# Patient Record
Sex: Female | Born: 1955 | Race: White | Hispanic: No | Marital: Married | Smoking: Former smoker
Health system: Southern US, Community
[De-identification: ages and names within clinical notes are randomized; demographics above are authoritative.]

## PROBLEM LIST (undated history)

## (undated) DIAGNOSIS — K5792 Diverticulitis of intestine, part unspecified, without perforation or abscess without bleeding: Secondary | ICD-10-CM

## (undated) DIAGNOSIS — M1990 Unspecified osteoarthritis, unspecified site: Secondary | ICD-10-CM

## (undated) DIAGNOSIS — N200 Calculus of kidney: Secondary | ICD-10-CM

## (undated) DIAGNOSIS — M722 Plantar fascial fibromatosis: Secondary | ICD-10-CM

## (undated) DIAGNOSIS — Z87442 Personal history of urinary calculi: Secondary | ICD-10-CM

## (undated) HISTORY — DX: Plantar fascial fibromatosis: M72.2

## (undated) HISTORY — DX: Unspecified osteoarthritis, unspecified site: M19.90

## (undated) HISTORY — PX: TONSILECTOMY, ADENOIDECTOMY, BILATERAL MYRINGOTOMY AND TUBES: SHX2538

## (undated) HISTORY — DX: Diverticulitis of intestine, part unspecified, without perforation or abscess without bleeding: K57.92

## (undated) HISTORY — DX: Calculus of kidney: N20.0

---

## 1980-10-14 HISTORY — PX: CHOLECYSTECTOMY: SHX55

## 1991-10-15 HISTORY — PX: TUBAL LIGATION: SHX77

## 2006-04-21 ENCOUNTER — Emergency Department (HOSPITAL_COMMUNITY): Admission: EM | Admit: 2006-04-21 | Discharge: 2006-04-21 | Payer: Self-pay | Admitting: Emergency Medicine

## 2009-01-20 ENCOUNTER — Ambulatory Visit: Payer: Self-pay | Admitting: Family Medicine

## 2009-11-14 ENCOUNTER — Ambulatory Visit: Payer: Self-pay | Admitting: Family Medicine

## 2009-11-24 ENCOUNTER — Ambulatory Visit: Payer: Self-pay | Admitting: Family Medicine

## 2009-12-18 ENCOUNTER — Ambulatory Visit: Payer: Self-pay | Admitting: Occupational Medicine

## 2009-12-18 DIAGNOSIS — D367 Benign neoplasm of other specified sites: Secondary | ICD-10-CM

## 2010-11-13 NOTE — Miscellaneous (Signed)
Summary: Immunizations  Immunizations   Imported By: Junius Finner 11/14/2009 13:34:48  _____________________________________________________________________  External Attachment:    Type:   Image     Comment:   External Document

## 2010-11-13 NOTE — Assessment & Plan Note (Signed)
Summary: WART/KH   Vital Signs:  Patient Profile:   55 Years Old Female CC:      Knot on rt middle finger x 6 months Height:     61.5 inches Weight:      210 pounds O2 Sat:      97 % O2 treatment:    Room Air Temp:     96.5 degrees F oral Pulse rate:   80 / minute Pulse rhythm:   regular Resp:     16 per minute BP sitting:   125 / 66  (right arm) Cuff size:   regular  Vitals Entered By: Emilio Math (December 18, 2009 11:44 AM)                  Current Allergies (reviewed today): No known allergies History of Present Illness Chief Complaint: Knot on rt middle finger x 6 months History of Present Illness: Needs derm consult.    Not a wart.   No charge for today's visit.   REVIEW OF SYSTEMS Constitutional Symptoms      Denies fever, chills, night sweats, weight loss, weight gain, and fatigue.  Eyes       Denies change in vision, eye pain, eye discharge, glasses, contact lenses, and eye surgery. Ear/Nose/Throat/Mouth       Denies hearing loss/aids, change in hearing, ear pain, ear discharge, dizziness, frequent runny nose, frequent nose bleeds, sinus problems, sore throat, hoarseness, and tooth pain or bleeding.  Respiratory       Denies dry cough, productive cough, wheezing, shortness of breath, asthma, bronchitis, and emphysema/COPD.  Cardiovascular       Denies murmurs, chest pain, and tires easily with exhertion.    Gastrointestinal       Denies stomach pain, nausea/vomiting, diarrhea, constipation, blood in bowel movements, and indigestion. Genitourniary       Denies painful urination, kidney stones, and loss of urinary control. Neurological       Denies paralysis, seizures, and fainting/blackouts. Musculoskeletal       Denies muscle pain, joint pain, joint stiffness, decreased range of motion, redness, swelling, muscle weakness, and gout.  Skin       Complains of unusual moles/lumps or sores.      Denies bruising and hair/skin or nail changes.  Psych  Denies mood changes, temper/anger issues, anxiety/stress, speech problems, depression, and sleep problems.  Past History:  Past Medical History: Reviewed history from 01/20/2009 and no changes required. plantar fasciitis  Past Surgical History: Reviewed history from 01/20/2009 and no changes required. Cholecystectomy  Family History: Reviewed history from 01/20/2009 and no changes required. Family History of Cardiovascular disorder-heart valve replacement  Social History: Reviewed history from 01/20/2009 and no changes required. 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  Diagnoses and expected course of recovery discussed and will return if not improved as expected or if the condition worsens. Patient and/or caregiver verbalized understanding.

## 2010-11-13 NOTE — Assessment & Plan Note (Signed)
Summary: CUT LEFT MIDDLE FINGER/KH   Vital Signs:  Patient Profile:   55 Years Old Female CC:      laceration to left middle finger Height:     61.5 inches O2 Sat:      96 % O2 treatment:    Room Air Temp:     98.0 degrees F oral Pulse rate:   82 / minute Resp:     18 per minute BP sitting:   125 / 67  (right arm)  Pt. in pain?   yes    Location:   left middle finger    Intensity:   7    Type:       burning  Vitals Entered By: Lita Mains, RN                   Updated Prior Medication List: DICLOFENAC SODIUM 75 MG TBEC (DICLOFENAC SODIUM)  MECLIZINE HCL 12.5 MG TABS (MECLIZINE HCL) 1 or 2 tabs by mouth three times a day as needed dizziness  Current Allergies (reviewed today): No known allergies History of Present Illness History from: patient Chief Complaint: laceration to left middle finger History of Present Illness: Patient was shapening a butcher knife when she cut her left middle finger. She has positive sensation, cap refill, and movement to the finger.  Subjective:  Patient complains of history as above.  She does not remember her last Td.     Objective:  Right third finger:  Simple 1cm long superficial laceration dorsal surface over PIP joint.  All joints have full range of motion.  Distal neurovascular intact  Assessment New Problems: LACERATION, FINGER (ICD-883.0)   Plan New Orders: Tdap => 43yrs IM [90715] Repair Superficial Wound(s) 2.5cm or <(scalp,neck,axillae,ext gent,trunk,extre) [12001] Est. Patient Level III [40981] Planning Comments:   Return in 10 days for suture removal.  Apply Bacitracin and clean bandage daily.  Return for signs of imfection Tdap given   The patient and/or caregiver has been counseled thoroughly with regard to medications prescribed including dosage, schedule, interactions, rationale for use, and possible side effects and they verbalize understanding.  Diagnoses and expected course of recovery discussed and will  return if not improved as expected or if the condition worsens. Patient and/or caregiver verbalized understanding.   PROCEDURE:  Suture Site: Left 3rd finger Size: 1cm simple Number of Lacerations: One Anesthesia: Digital 2% plain lidocaine Procedure: Procedure:  Laceration Repair Discussed benefits and risks of procedure and verbal consent obtained. Using sterile technique and digital 2% lidocaine without epinephrine, cleansed wound with Betadine followed by copious lavage with normal saline.  Wound carefully inspected for debris and foreign bodies; none found.  Wound closed with #2 , 5-0 interrupted nylon sutures.  Bacitracin and non-stick sterile dressing applied.  Wound precautions explained to patient.    Disposition: Home  Appended Document: CUT LEFT MIDDLE FINGER/KH Tdap vaccine given: 11/14/2009 Site: Right deltoid Lot # [id number] Exp: 12/09/11 Patient tolerated injection without complications Given by: Lita Mains, RN

## 2010-11-13 NOTE — Assessment & Plan Note (Signed)
Summary: SUTURE REMOVAL  followup :suture removal from left middle finger BP: 118/74 HR: 70 02sat: 99% Temp: 97.3 RR:14  Subjective:  Patient has no complaints  Objective: Left third finger: well healed laceration without signs of infection  Assessment:  ENCOUNTER FOR REMOVAL OF SUTURES (ICD-V58.32)   Plan:   Removed 2 sutures from left 3rd finger.

## 2011-01-01 ENCOUNTER — Encounter: Payer: Self-pay | Admitting: Family Medicine

## 2011-01-01 ENCOUNTER — Other Ambulatory Visit: Payer: Self-pay | Admitting: Family Medicine

## 2011-01-01 ENCOUNTER — Ambulatory Visit
Admission: RE | Admit: 2011-01-01 | Discharge: 2011-01-01 | Disposition: A | Discharge status: Home / Self Care | Payer: BC Managed Care – PPO | Source: Ambulatory Visit | Attending: Family Medicine | Admitting: Family Medicine

## 2011-01-01 ENCOUNTER — Inpatient Hospital Stay (INDEPENDENT_AMBULATORY_CARE_PROVIDER_SITE_OTHER)
Admission: RE | Admit: 2011-01-01 | Discharge: 2011-01-01 | Disposition: A | Discharge status: Home / Self Care | Payer: BC Managed Care – PPO | Source: Ambulatory Visit | Attending: Family Medicine | Admitting: Family Medicine

## 2011-01-01 DIAGNOSIS — R0989 Other specified symptoms and signs involving the circulatory and respiratory systems: Secondary | ICD-10-CM | POA: Insufficient documentation

## 2011-01-01 DIAGNOSIS — M940 Chondrocostal junction syndrome [Tietze]: Secondary | ICD-10-CM | POA: Insufficient documentation

## 2011-01-01 DIAGNOSIS — R5381 Other malaise: Secondary | ICD-10-CM | POA: Insufficient documentation

## 2011-01-01 DIAGNOSIS — R0609 Other forms of dyspnea: Secondary | ICD-10-CM

## 2011-01-01 DIAGNOSIS — R079 Chest pain, unspecified: Secondary | ICD-10-CM

## 2011-01-01 DIAGNOSIS — R5383 Other fatigue: Secondary | ICD-10-CM | POA: Insufficient documentation

## 2011-01-01 LAB — CONVERTED CEMR LAB
Bilirubin Urine: NEGATIVE
Glucose, Urine, Semiquant: NEGATIVE
Specific Gravity, Urine: >=1.03
Urobilinogen, UA: 0.2

## 2011-01-02 ENCOUNTER — Encounter: Payer: Self-pay | Admitting: Family Medicine

## 2011-01-02 LAB — CONVERTED CEMR LAB
Albumin: 4.5 g/dL (ref 3.5–5.2)
Chloride: 106 meq/L (ref 96–112)
Creatinine, Ser: 0.72 mg/dL (ref 0.40–1.20)
Glucose, Bld: 95 mg/dL (ref 70–99)
TSH: 1.356 microintl units/mL (ref 0.350–4.500)
Total Bilirubin: 0.4 mg/dL (ref 0.3–1.2)

## 2011-01-03 ENCOUNTER — Encounter: Payer: Self-pay | Admitting: Family Medicine

## 2011-01-04 ENCOUNTER — Encounter: Payer: Self-pay | Admitting: Family Medicine

## 2011-01-04 ENCOUNTER — Telehealth (INDEPENDENT_AMBULATORY_CARE_PROVIDER_SITE_OTHER): Payer: Self-pay | Admitting: *Deleted

## 2011-01-09 ENCOUNTER — Encounter: Payer: Self-pay | Admitting: Family Medicine

## 2011-01-09 ENCOUNTER — Ambulatory Visit (INDEPENDENT_AMBULATORY_CARE_PROVIDER_SITE_OTHER): Payer: BC Managed Care – PPO | Admitting: Family Medicine

## 2011-01-09 VITALS — BP 107/69 | HR 69 | Ht 62.0 in | Wt 227.0 lb

## 2011-01-09 DIAGNOSIS — R0609 Other forms of dyspnea: Secondary | ICD-10-CM

## 2011-01-09 DIAGNOSIS — R0989 Other specified symptoms and signs involving the circulatory and respiratory systems: Secondary | ICD-10-CM

## 2011-01-09 DIAGNOSIS — Z13220 Encounter for screening for lipoid disorders: Secondary | ICD-10-CM

## 2011-01-09 MED ORDER — DEXLANSOPRAZOLE 60 MG PO CPDR: 60.0000 mg | DELAYED_RELEASE_CAPSULE | Freq: Every day | ORAL | Status: DC

## 2011-01-09 NOTE — Progress Notes (Signed)
  Subjective:    Patient ID: Stephanie [name], female    DOB: [birth year], 55 y.o.   MRN: [id number]  HPI Says she will notice SOB with activity, consistantly.  Once she rests it can take 20-30 min to resolve. No CP with it. Did have CP episode one time but that was 6 weeks ago after eating out.  No wheezing.  Former smoker. Father with COPD and sister with COPD. But they both smoke.  This never happened before the last 6 weeks. Will feel ike a heaviness. No palpitations. No hx of heart problems.  No hx asthma. No severe allergies.  No new meds or supplements.  No new exposures at work on home.  Had normal EKG, CXR, CMP, CBC, TSH.  More recent indigestation. Triggered by certain foods.    Review of Systems  BP 107/69  Pulse 69  Ht 5\' 2"  (1.575 m)  Wt 227 lb (102.967 kg)  BMI 41.52 kg/m2  SpO2 98%    No Known Allergies  Past Medical History  Diagnosis Date  . Plantar fasciitis     Past Surgical History  Procedure Date  . Cholecystectomy   . Tonsilectomy, adenoidectomy, bilateral myringotomy and tubes     History   Social History  . Marital Status: Married    Spouse Name: Jenniferlynn Saad    Number of Children: 2  . Years of Education: HS   Occupational History  . BUS DRIVER      [employer]   Social History Main Topics  . Smoking status: Former Smoker    Quit date: 10/14/1976  . Smokeless tobacco: Not on file  . Alcohol Use: No  . Drug Use: No  . Sexually Active: Yes   Other Topics Concern  . Not on file   Social History Narrative   2-4 caffeinated drinks per day. No regular exercise.     Family History  Problem Relation Age of Onset  . Coronary artery disease Other     family hx of/ heart valve replacement  . Alcohol abuse Father   . Cancer Father   . Prostate cancer Father     No current outpatient prescriptions on file.     Objective:   Physical Exam  Constitutional: She appears well-developed and well-nourished.       obese  HENT:    Head: Normocephalic and atraumatic.  Eyes: Conjunctivae and EOM are normal. Pupils are equal, round, and reactive to light.  Neck: Normal range of motion. Neck supple. No thyromegaly present.  Cardiovascular: Normal rate, regular rhythm and normal heart sounds.        No carotid or abdominal bruits.  NO LE edema.   Pulmonary/Chest: Effort normal and breath sounds normal.  Abdominal: Soft. Bowel sounds are normal.  Musculoskeletal: She exhibits no edema.  Lymphadenopathy:    She has no cervical adenopathy.  Skin: Skin is warm and dry.  Psychiatric: She has a normal mood and affect. Her behavior is normal. Judgment and thought content normal.          Assessment & Plan:  Reviewed UC notes, CXR, EKG, and labwork.

## 2011-01-09 NOTE — Patient Instructions (Signed)
Try the dexilant once  A day with breakfast.  Let me know if helping your symptoms We will call you with the cardiology referral.

## 2011-01-09 NOTE — Assessment & Plan Note (Addendum)
Her sxs started suddenly about 6 weeks ago.  Will perform spirometry to further evaluate her lungs. Sister and father with hx of COPD but both are heavy smokers. Consider testing alpha 1 AT if abnormal spiro.  Had nl CXR. Consider cardiac causes. Will refer to cards as well. Also consider GERD as she has been having more recent indigestion.  Will give PPI samples to try for 2 weeks and see if notices any sxs relief. Go to ED if SOB lasts longer or is more severe.   FVC 84%, FEV1 82%, FEV1% is 81%, effort was fair. Poor peak on the curve. Post NEB showed 12 % improvement on ly in FVC. Normal spiro.

## 2011-01-10 ENCOUNTER — Telehealth: Payer: Self-pay | Admitting: Family Medicine

## 2011-01-10 NOTE — Telephone Encounter (Signed)
Call pt: LDL high in the 140s. Work on low fat diet and exercise and recheck in 3 months.

## 2011-01-10 NOTE — Telephone Encounter (Signed)
LDL is high in the 140s. Work on low fat diet and exercise and lets recheck in 3 months.

## 2011-01-10 NOTE — Progress Notes (Signed)
  Phone Note Outgoing Call Call back at Home Phone [phone] [facility]     Call placed by: Lajean Saver RN,  January 04, 2011 2:18 PM Call placed to: Patient Action Taken: Phone Call Completed Summary of Call: Callback: No answer. Message left on personal cell with reason for call and call back with questions/concerns. Lab results given

## 2011-01-10 NOTE — Assessment & Plan Note (Signed)
Summary: SOB/CHEST PAIN rm 3   Vital Signs:  Patient Profile:   55 Years Old Female CC:      SOB, chest tight, LT arm ache x 3-4wks Height:     61.5 inches Weight:      229.50 pounds O2 Sat:      97 % O2 treatment:    Room Air Temp:     98.3 degrees F oral Pulse rate:   71 / minute Resp:     16 per minute BP sitting:   107 / 72  (left arm) Cuff size:   large  Vitals Entered By: Clemens Catholic LPN (January 01, 2011 11:32 AM)                  Updated Prior Medication List: No Medications Current Allergies: No known allergies History of Present Illness Chief Complaint: SOB, chest tight, LT arm ache x 3-4wks History of Present Illness:  Subjective:  Patient had an episode of chest discomfort and nausea about a month ago after eating, but symptoms resolved after rest.  She complains of occasional intermittent sub-sternal chest pain over the last month that lasts several minutes and resolves spontaneously. The discomfort is worse when moving her left arm.  It has been improved with aspirin.  She complains of persistent fatigue, and mild shortness of breath with activity.  No cough, fevers, chills, and sweats.  No leg pain. Family history is negative for heart disease, but her mother and aunt have diabetes. She does not smoke or use alcohol. She has not had cholesterol checked in past.  REVIEW OF SYSTEMS Constitutional Symptoms      Denies fever, chills, night sweats, weight loss, weight gain, and fatigue.  Eyes       Denies change in vision, eye pain, eye discharge, glasses, contact lenses, and eye surgery. Ear/Nose/Throat/Mouth       Denies hearing loss/aids, change in hearing, ear pain, ear discharge, dizziness, frequent runny nose, frequent nose bleeds, sinus problems, sore throat, hoarseness, and tooth pain or bleeding.  Respiratory       Complains of shortness of breath.      Denies dry cough, productive cough, wheezing, asthma, bronchitis, and emphysema/COPD.    Cardiovascular       Complains of chest pain.      Denies murmurs and tires easily with exhertion.    Gastrointestinal       Denies stomach pain, nausea/vomiting, diarrhea, constipation, blood in bowel movements, and indigestion. Genitourniary       Denies painful urination, kidney stones, and loss of urinary control. Neurological       Complains of numbness and tingling.      Denies paralysis, seizures, and fainting/blackouts. Musculoskeletal       Denies muscle pain, joint pain, joint stiffness, decreased range of motion, redness, swelling, muscle weakness, and gout.  Skin       Denies bruising, unusual mles/lumps or sores, and hair/skin or nail changes.  Psych       Denies mood changes, temper/anger issues, anxiety/stress, speech problems, depression, and sleep problems. Other Comments: pt states that 3-4 wks ago that she went out to eat and she suddenly had chest pain and nausea. she went home and rested and felt better. since then she has had LT side chest discomfort, LT arm numbness/tingling, SOB w/ exhertion, and fatigue. she took an ASA yesterday and states it helped a little.    Past History:  Past Medical History: Reviewed history from 01/20/2009 and  no changes required. plantar fasciitis  Past Surgical History: Cholecystectomy Tonsillectomy  Family History: Family History of Cardiovascular disorder-heart valve replacement prostae CA/ COPD  Social History: Reviewed history from 01/20/2009 and no changes required. 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  Extraocular movement is intact.  Conjunctivae are not inflamed.  Ears:  Canals normal.  Tympanic membranes normal.   Nose:  Not congested Pharynx:  Negative Neck:  Supple.  No adenopathy is present.  No thyromegaly is  present  Lungs:  Clear to auscultation.  Breath sounds are equal.  Chest:  Distinct tenderness over the sternum and left pectoralis muscle.  Palpation over this area recreates her discomfort Abdomen:  Nontender without masses or hepatosplenomegaly.  Bowel sounds are present.  No CVA or flank tenderness.  Skin:  No rash Extremities:  No edema.   EKG:  No acute changes CBC:  WBC 5.2; Normal diff; Hgb 13.2 urinalysis (dipstick): negative Assessment New Problems: COSTOCHONDRITIS (ICD-733.6) CHEST PAIN (ICD-786.50) DYSPNEA ON EXERTION (ICD-786.09) FATIGUE (ICD-780.79)  CHEST PAIN A RESULT OF COSTOCHONDRITIS PATIENT NEEDS FOLLOWUP BY PCP  Plan New Orders: CBC w/Diff [19147-82956] Urinalysis [CPT-81003] T-CMP with estimated GFR [[address]] T-TSH [46962-95284] T-Chest x-ray, 2 views [71020] Est. Patient Level IV [13244] Pulse Oximetry (single measurment) [94760] Planning Comments:   Check CMP, TSH. Recommend ibuprofen for chest pain.  Given a Water quality scientist patient information and instruction sheet on topic costochondritis Recommend follow-up by PCP.  Needs annual pap smear, mammogram, screening colonoscopy, fasting lipid profile, etc.  Return for worsening symptoms   The patient and/or caregiver has been counseled thoroughly with regard to medications prescribed including dosage, schedule, interactions, rationale for use, and possible side effects and they verbalize understanding.  Diagnoses and expected course of recovery discussed and will return if not improved as expected or if the condition worsens. Patient and/or caregiver verbalized understanding.   Orders Added: 1)  CBC w/Diff [01027-25366] 2)  Urinalysis [CPT-81003] 3)  T-CMP with estimated GFR [[address]] 4)  T-TSH [44034-74259] 5)  T-Chest x-ray, 2 views [71020] 6)  Est. Patient Level IV [56387] 7)  Pulse Oximetry (single measurment) [94760]    Laboratory Results   Urine Tests  Date/Time Received: January 01, 2011  12:44 PM  Date/Time Reported: January 01, 2011 12:44 PM   Routine Urinalysis   Color: yellow Appearance: Clear Glucose: negative   (Normal Range: Negative) Bilirubin: negative   (Normal Range: Negative) Ketone: negative   (Normal Range: Negative) Spec. Gravity: >=1.030   (Normal Range: 1.003-1.035) Blood: negative   (Normal Range: Negative) pH: 5.5   (Normal Range: 5.0-8.0) Protein: negative   (Normal Range: Negative) Urobilinogen: 0.2   (Normal Range: 0-1) Nitrite: negative   (Normal Range: Negative) Leukocyte Esterace: negative   (Normal Range: Negative)

## 2011-01-11 ENCOUNTER — Ambulatory Visit (INDEPENDENT_AMBULATORY_CARE_PROVIDER_SITE_OTHER): Payer: BC Managed Care – PPO | Admitting: Cardiology

## 2011-01-11 ENCOUNTER — Encounter: Payer: Self-pay | Admitting: Cardiology

## 2011-01-11 DIAGNOSIS — R079 Chest pain, unspecified: Secondary | ICD-10-CM | POA: Insufficient documentation

## 2011-01-11 NOTE — Assessment & Plan Note (Signed)
Symptoms atypical. Schedule stress echocardiogram to exclude ischemia and quantify LV function.

## 2011-01-11 NOTE — Patient Instructions (Addendum)
Labs today Your physician has requested that you have a stress echocardiogram. For further information please visit [email or web address] Please follow instruction sheet as given. Your physician recommends that you schedule a follow-up appointment in: 4 weeks in [place]

## 2011-01-11 NOTE — Assessment & Plan Note (Signed)
Stress echocardiogramas described above. Patient also a school bus driver. Scheduled d-dimer although pulmonary embolus seems less likely.

## 2011-01-11 NOTE — Telephone Encounter (Signed)
Pt notified of results

## 2011-01-11 NOTE — Progress Notes (Signed)
HPI: 55 yo female with no prior cardiac history for evaluation of dyspnea. Recent chest xray, BUN/CR and TSH normal. Patient states that approximately 5 weeks ago she had an episode of chest pain. This occurred after eating. It was in the left upper chest. It did not radiate. It was not pleuritic, positional or related to food. It was not exertional. There was associated nausea, diaphoresis and shortness of breath. He resolved spontaneously after approximately 5 minutes. Since the she notes dyspnea on exertion but no orthopnea, PND, syncope or exertional chest pain. Because of the above we were asked to further evaluate.  Current Outpatient Prescriptions  Medication Sig Dispense Refill  . dexlansoprazole (DEXILANT) 60 MG capsule Take 1 capsule (60 mg total) by mouth daily.  10 capsule  0    No Known Allergies  Past Medical History  Diagnosis Date  . Plantar fasciitis   . Nephrolithiasis     Past Surgical History  Procedure Date  . Cholecystectomy   . Tonsilectomy, adenoidectomy, bilateral myringotomy and tubes   . Tubal ligation     History   Social History  . Marital Status: Married    Spouse Name: Kymorah Korf    Number of Children: 2  . Years of Education: HS   Occupational History  . BUS DRIVER      [employer]   Social History Main Topics  . Smoking status: Former Smoker -- 0.2 packs/day for 2 years    Types: Cigarettes    Quit date: 10/14/1976  . Smokeless tobacco: Not on file  . Alcohol Use: No  . Drug Use: No  . Sexually Active: Yes   Other Topics Concern  . Not on file   Social History Narrative   2-4 caffeinated drinks per day. No regular exercise.     Family History  Problem Relation Age of Onset  . Coronary artery disease Other     family hx of/ heart valve replacement  . Alcohol abuse Father   . Cancer Father   . Prostate cancer Father     ROS: no fevers or chills, productive cough, hemoptysis, dysphasia, odynophagia, melena,  hematochezia, dysuria, hematuria, rash, seizure activity, orthopnea, PND, pedal edema, claudication. Remaining systems are negative.  Physical Exam: General:  Well developed/well nourished in NAD Skin warm/dry Patient not depressed No peripheral clubbing Back-normal HEENT-normal/normal eyelids Neck supple/normal carotid upstroke bilaterally; no bruits; no JVD; no thyromegaly chest - CTA/ normal expansion CV - RRR/normal S1 and S2; no murmurs, rubs or gallops;  PMI nondisplaced Abdomen -NT/ND, no HSM, no mass, + bowel sounds, no bruit 2+ femoral pulses, no bruits Ext-no edema, chords, 2+ DP Neuro-grossly nonfocal  ECG 01/01/11 - NSR with no ST changes.

## 2011-01-14 ENCOUNTER — Encounter: Payer: Self-pay | Admitting: Family Medicine

## 2011-01-14 ENCOUNTER — Telehealth: Payer: Self-pay | Admitting: *Deleted

## 2011-01-14 NOTE — Telephone Encounter (Signed)
Message copied by Deliah Goody on Mon Jan 14, 2011  4:52 PM ------      Message from: Olga Millers      Created: Fri Jan 11, 2011  5:49 PM       Schedule CTA to R/O pulmonary embolus if no dye allergy

## 2011-01-15 ENCOUNTER — Ambulatory Visit (INDEPENDENT_AMBULATORY_CARE_PROVIDER_SITE_OTHER)
Admission: RE | Admit: 2011-01-15 | Discharge: 2011-01-15 | Disposition: A | Discharge status: Home / Self Care | Payer: BC Managed Care – PPO | Source: Ambulatory Visit | Attending: Cardiology | Admitting: Cardiology

## 2011-01-15 ENCOUNTER — Telehealth: Payer: Self-pay | Admitting: Cardiology

## 2011-01-15 DIAGNOSIS — R079 Chest pain, unspecified: Secondary | ICD-10-CM

## 2011-01-15 MED ORDER — IOHEXOL 300 MG/ML  SOLN
100.0000 mL | Freq: Once | INTRAMUSCULAR | Status: AC | PRN
  Administered 2011-01-15: 100 mL via INTRAVENOUS

## 2011-01-15 NOTE — Telephone Encounter (Signed)
Addended by: Meredith Staggers on: 01/15/2011 10:14 AM   Modules accepted: Orders

## 2011-01-15 NOTE — Telephone Encounter (Signed)
Pt aware of elevated D-Dimer and Dr Ludwig Clarks order for CTA she states she has no allergy that she is aware of, CTA sch for today at 2:30, order placed, Rose stated she would call Charmaine for precert

## 2011-01-23 ENCOUNTER — Other Ambulatory Visit (HOSPITAL_COMMUNITY): Payer: Self-pay

## 2011-01-23 ENCOUNTER — Ambulatory Visit (HOSPITAL_COMMUNITY): Payer: BC Managed Care – PPO | Attending: Cardiology | Admitting: Radiology

## 2011-01-23 ENCOUNTER — Ambulatory Visit (HOSPITAL_BASED_OUTPATIENT_CLINIC_OR_DEPARTMENT_OTHER): Payer: BC Managed Care – PPO | Admitting: Radiology

## 2011-01-23 DIAGNOSIS — R42 Dizziness and giddiness: Secondary | ICD-10-CM | POA: Insufficient documentation

## 2011-01-23 DIAGNOSIS — R0789 Other chest pain: Secondary | ICD-10-CM

## 2011-01-23 DIAGNOSIS — R0989 Other specified symptoms and signs involving the circulatory and respiratory systems: Secondary | ICD-10-CM | POA: Insufficient documentation

## 2011-01-23 DIAGNOSIS — R072 Precordial pain: Secondary | ICD-10-CM

## 2011-01-23 DIAGNOSIS — R0609 Other forms of dyspnea: Secondary | ICD-10-CM | POA: Insufficient documentation

## 2011-01-23 MED ORDER — PERFLUTREN PROTEIN A MICROSPH IV SUSP
1.3000 mL | Freq: Once | INTRAVENOUS | Status: AC
  Administered 2011-01-23: 1.3 mL via INTRAVENOUS

## 2011-02-07 ENCOUNTER — Encounter: Payer: Self-pay | Admitting: Cardiology

## 2011-02-08 ENCOUNTER — Encounter: Payer: Self-pay | Admitting: Cardiology

## 2011-02-08 ENCOUNTER — Ambulatory Visit (INDEPENDENT_AMBULATORY_CARE_PROVIDER_SITE_OTHER): Payer: BC Managed Care – PPO | Admitting: Cardiology

## 2011-02-08 VITALS — BP 112/70 | HR 70 | Resp 14 | Ht 61.0 in | Wt 230.0 lb

## 2011-02-08 DIAGNOSIS — R0989 Other specified symptoms and signs involving the circulatory and respiratory systems: Secondary | ICD-10-CM

## 2011-02-08 DIAGNOSIS — R0609 Other forms of dyspnea: Secondary | ICD-10-CM

## 2011-02-08 NOTE — Progress Notes (Signed)
HPI: 55 yo female I saw in March 2012 for evaluation of dyspnea. Recent chest xray, BUN/CR and TSH normal. Stress echocardiogram in April of 2012 was normal. CTA of the chest in April of 2012 showed no pulmonary embolus and bilateral pulmonary ground-glass opacity felt to represent a combination of atelectasis and mosaic attenuation. The latter may reflect small airway disease in this setting. Since I saw her previously, she continues to have dyspnea on exertion. This has been present for 4-6 months. There is no orthopnea, PND, pedal edema or syncope. She occasionally feels a pain in her chest for several minutes. It is not pleuritic or exertional and resolves spontaneously.    Current Outpatient Prescriptions  Medication Sig Dispense Refill  . DISCONTD: dexlansoprazole (DEXILANT) 60 MG capsule Take 1 capsule (60 mg total) by mouth daily.  10 capsule  0    No Known Allergies  Past Medical History  Diagnosis Date  . Plantar fasciitis   . Nephrolithiasis     Past Surgical History  Procedure Date  . Cholecystectomy   . Tonsilectomy, adenoidectomy, bilateral myringotomy and tubes   . Tubal ligation     History   Social History  . Marital Status: Married    Spouse Name: Lavone Barrientes    Number of Children: 2  . Years of Education: HS   Occupational History  . BUS DRIVER      [employer]   Social History Main Topics  . Smoking status: Former Smoker -- 0.2 packs/day for 2 years    Types: Cigarettes    Quit date: 10/14/1976  . Smokeless tobacco: Not on file  . Alcohol Use: No  . Drug Use: No  . Sexually Active: Yes   Other Topics Concern  . Not on file   Social History Narrative   2-4 caffeinated drinks per day. No regular exercise.     Family History  Problem Relation Age of Onset  . Coronary artery disease Other     family hx of/ heart valve replacement  . Alcohol abuse Father   . Cancer Father   . Prostate cancer Father     ROS: no fevers or  chills, productive cough, hemoptysis, dysphasia, odynophagia, melena, hematochezia, dysuria, hematuria, rash, seizure activity, orthopnea, PND, pedal edema, claudication. Remaining systems are negative.  Physical Exam: General:  Well developed/well nourished in NAD Skin warm/dry Patient not depressed No peripheral clubbing Back-normal HEENT-normal/normal eyelids Neck supple/normal carotid upstroke bilaterally; no bruits; no JVD; no thyromegaly chest - CTA/ normal expansion CV - RRR/normal S1 and S2; no murmurs, rubs or gallops;  PMI nondisplaced Abdomen -NT/ND, no HSM, no mass, + bowel sounds, no bruit 2+ femoral pulses, no bruits Ext-trace edema, no chords, 2+ DP Neuro-grossly nonfocal

## 2011-02-08 NOTE — Patient Instructions (Signed)
You have been referred to Pulmonary for dyspnea and abnormal chest CT.  We will see you back on an as needed basis.

## 2011-02-08 NOTE — Assessment & Plan Note (Signed)
Etiology unclear. Stress echocardiogram normal. CTA shows no pulmonary embolus but ground glass opacity noted. Question interstitial lung disease. There may also be a component of obesity hypoventilation syndrome. She states she has had previous evaluation for sleep apnea. I will refer to pulmonary for further workup.

## 2011-02-08 NOTE — Assessment & Plan Note (Signed)
Symptoms do not sound cardiac related. Stress echocardiogram normal. No further cardiac evaluation.

## 2011-03-04 ENCOUNTER — Encounter: Payer: Self-pay | Admitting: Pulmonary Disease

## 2011-03-04 ENCOUNTER — Ambulatory Visit (INDEPENDENT_AMBULATORY_CARE_PROVIDER_SITE_OTHER): Payer: BC Managed Care – PPO | Admitting: Pulmonary Disease

## 2011-03-04 VITALS — BP 128/88 | HR 58 | Temp 97.8°F | Ht 61.0 in | Wt 226.8 lb

## 2011-03-04 DIAGNOSIS — R0989 Other specified symptoms and signs involving the circulatory and respiratory systems: Secondary | ICD-10-CM

## 2011-03-04 DIAGNOSIS — R0609 Other forms of dyspnea: Secondary | ICD-10-CM

## 2011-03-04 LAB — PULMONARY FUNCTION TEST

## 2011-03-04 NOTE — Patient Instructions (Addendum)
Your breathing tests were totally normal today.  Work on weight loss and conditioning for next 8weeks.  If you do not see any improvement, please call.  If things do improve, will see as needed.

## 2011-03-04 NOTE — Progress Notes (Signed)
  Subjective:    Patient ID: Stephanie [name], female    DOB: 12/05/1955, 55 y.o.   MRN: [id number]  HPI The pt is a 55y/o female who I have been asked to see for doe.  She was in her usual state of health until March of this year when she developed chest discomfort and dyspnea.  She felt this was a dramatic change in her functional status.  She underwent ct chest that showed no PE, and GG densities related to movement artifact, dependent atx, and mosaic attentuation.  She had stress echo that was unremarkable as well.  Currently, she will get winded walking 2-3 blocks at moderate pace, and worse if carrying a load in her arms.  She denies cough, congestion, or worsening of her LE edema.  She has no h/o asthma.  She tells me her weight is near her usual baseline a year ago.  [place] but has no significant exposures to organic material, and no recent changes in her exposures.  She did have full pfts today that showed no obstruction, no restriction, and a normal DLCO.     Review of Systems  Constitutional: Negative for fever and unexpected weight change.  HENT: Negative for ear pain, nosebleeds, congestion, sore throat, rhinorrhea, sneezing, trouble swallowing, dental problem, postnasal drip and sinus pressure.   Eyes: Negative for redness and itching.  Respiratory: Positive for cough and shortness of breath. Negative for chest tightness and wheezing.   Cardiovascular: Negative for palpitations and leg swelling.  Gastrointestinal: Negative for nausea and vomiting.  Genitourinary: Negative for dysuria.  Musculoskeletal: Negative for joint swelling.  Skin: Negative for rash.  Neurological: Negative for headaches.  Hematological: Does not bruise/bleed easily.  Psychiatric/Behavioral: Negative for dysphoric mood. The patient is not nervous/anxious.        Objective:   Physical Exam Constitutional:  Well developed, no acute distress  HENT:  Nares patent without  discharge  Oropharynx without exudate, palate and uvula are normal  Eyes:  Perrla, eomi, no scleral icterus  Neck:  No JVD, no TMG  Cardiovascular:  Normal rate, regular rhythm, no rubs or gallops.  No murmurs        Intact distal pulses  Pulmonary :  Normal breath sounds, no stridor or respiratory distress   No rales, rhonchi, or wheezing  Abdominal:  Soft, nondistended, bowel sounds present.  No tenderness noted.   Musculoskeletal:  No lower extremity edema noted.  Lymph Nodes:  No cervical lymphadenopathy noted  Skin:  No cyanosis noted  Neurologic:  Alert, appropriate, moves all 4 extremities without obvious deficit.         Assessment & Plan:

## 2011-03-04 NOTE — Progress Notes (Signed)
PFT done today. 

## 2011-03-06 NOTE — Assessment & Plan Note (Signed)
The pt has doe of unknown origin, with a fairly thorough w/u that is negative.  It is concerning the symptoms started fairly suddenly, but the pt admits it may have been coming on over time.  Her pfts are totally normal, nothing to suggest cardiac disease, and her ct scan is unimpressive.  At this point, I have discussed with her taking a few mos and working on conditioning and weight loss vs further investigation.  This may include CPST and V/Q.  At this point, the pt would like to take a few mos to work on weight loss and conditioning.  She will let us know if she does not improve, or if symptoms worsen.

## 2011-03-13 ENCOUNTER — Encounter: Payer: Self-pay | Admitting: Pulmonary Disease

## 2013-01-19 ENCOUNTER — Emergency Department (INDEPENDENT_AMBULATORY_CARE_PROVIDER_SITE_OTHER)
Admission: EM | Admit: 2013-01-19 | Discharge: 2013-01-19 | Disposition: A | Discharge status: Home / Self Care | Payer: BC Managed Care – PPO | Source: Home / Self Care | Attending: Family Medicine | Admitting: Family Medicine

## 2013-01-19 ENCOUNTER — Encounter (HOSPITAL_BASED_OUTPATIENT_CLINIC_OR_DEPARTMENT_OTHER): Payer: Self-pay

## 2013-01-19 ENCOUNTER — Ambulatory Visit (HOSPITAL_BASED_OUTPATIENT_CLINIC_OR_DEPARTMENT_OTHER)
Admit: 2013-01-19 | Discharge: 2013-01-19 | Disposition: A | Discharge status: Home / Self Care | Payer: BC Managed Care – PPO | Attending: Family Medicine | Admitting: Family Medicine

## 2013-01-19 ENCOUNTER — Encounter: Payer: Self-pay | Admitting: *Deleted

## 2013-01-19 DIAGNOSIS — R109 Unspecified abdominal pain: Secondary | ICD-10-CM

## 2013-01-19 DIAGNOSIS — I728 Aneurysm of other specified arteries: Secondary | ICD-10-CM | POA: Insufficient documentation

## 2013-01-19 DIAGNOSIS — K5289 Other specified noninfective gastroenteritis and colitis: Secondary | ICD-10-CM | POA: Insufficient documentation

## 2013-01-19 DIAGNOSIS — R188 Other ascites: Secondary | ICD-10-CM | POA: Insufficient documentation

## 2013-01-19 DIAGNOSIS — K5730 Diverticulosis of large intestine without perforation or abscess without bleeding: Secondary | ICD-10-CM | POA: Insufficient documentation

## 2013-01-19 DIAGNOSIS — N949 Unspecified condition associated with female genital organs and menstrual cycle: Secondary | ICD-10-CM | POA: Insufficient documentation

## 2013-01-19 LAB — POCT URINALYSIS DIP (MANUAL ENTRY)
Blood, UA: NEGATIVE
Ketones, POC UA: NEGATIVE
Leukocytes, UA: NEGATIVE
Nitrite, UA: NEGATIVE
Protein Ur, POC: 30
Urobilinogen, UA: 0.2 (ref 0–1)

## 2013-01-19 MED ORDER — CIPROFLOXACIN HCL 750 MG PO TABS: 750.0000 mg | ORAL_TABLET | Freq: Two times a day (BID) | ORAL | Status: DC

## 2013-01-19 MED ORDER — METRONIDAZOLE 500 MG PO TABS: ORAL_TABLET | ORAL | Status: DC

## 2013-01-19 MED ORDER — IOHEXOL 300 MG/ML  SOLN
100.0000 mL | Freq: Once | INTRAMUSCULAR | Status: AC | PRN
  Administered 2013-01-19: 100 mL via INTRAVENOUS

## 2013-01-19 NOTE — ED Provider Notes (Signed)
History     CSN: [id number]  Arrival date & time 01/19/13  4098   First MD Initiated Contact with Patient 01/19/13 1002      Chief Complaint  Patient presents with  . Abdominal Injury   HPI  Patient presents today with chief complaint of abdominal pain x1 day. Patient states she woke up this morning with severe lower/periumbilical of, pain. Patient states she's had some intermittent aching that has lasted about a minute or so that has waxed and waned over the course of the day. Patient also had some subjective chills. Patient to temperature and she had a temperature of 100.7. Also some mild nausea though no vomiting. Patient denies any diarrhea. 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 Abdominal pain is described as more of an ache with radiation to the back. Pain is an 8/10 at its worst. Patient does have a history of kidney stones, however symptoms are not similar to prior flares. No dysuria or hematuria. Patient reports she had a colonoscopy about 30 years ago when she had similar flare of this. Colonoscopy was normal per patient. No known history of diverticulosis. Patient denies eating any nuts or seeded foods. No alcohol use. No discharge or history of gynecologic abnormalities.  Past Medical History  Diagnosis Date  . Plantar fasciitis   . Nephrolithiasis     Past Surgical History  Procedure Laterality Date  . Cholecystectomy  1982  . Tonsilectomy, adenoidectomy, bilateral myringotomy and tubes      at age 11 approx.   . Tubal ligation  1993    Family History  Problem Relation Age of Onset  . Coronary artery disease Other     family hx of/ heart valve replacement  . Alcohol abuse Father   . Cancer Father   . Prostate cancer Father   . COPD Father     History  Substance Use Topics  . Smoking status: Former Smoker -- 0.20 packs/day for 2 years    Types: Cigarettes    Quit date: 10/14/1976  . Smokeless tobacco: Not on file  .  Alcohol Use: No    OB History   Grav Para Term Preterm Abortions TAB SAB Ect Mult Living                  Review of Systems  All other systems reviewed and are negative.    Allergies  Review of patient's allergies indicates no known allergies.  Home Medications  No current outpatient prescriptions on file.  BP 100/68  Pulse 106  Temp(Src) 97.9 F (36.6 C) (Oral)  Resp 14  Ht 5\' 1"  (1.549 m)  Wt 210 lb (95.255 kg)  BMI 39.7 kg/m2  SpO2 96%  Physical Exam  Constitutional:  Obese  HENT:  Head: Normocephalic and atraumatic.  Eyes: Conjunctivae are normal. Pupils are equal, round, and reactive to light.  Neck: Normal range of motion. Neck supple.  Cardiovascular: Normal rate and regular rhythm.   Pulmonary/Chest: Effort normal.  Abdominal:  Obese abdomen, positive bowel sounds, positive right lower quadrant,. Local, left lower quadrant abdominal tenderness palpation.  Musculoskeletal: Normal range of motion.  Neurological: She is alert.  Skin: Skin is warm.    ED Course  Procedures (including critical care time)  Labs Reviewed  POCT URINALYSIS DIP (MANUAL ENTRY)   No results found.   1. Abdominal  pain, other specified site       MDM  Differential diagnosis remains relatively broad for  symptoms including appendicitis, colitis, gastroenteritis, diverticulitis, nephrolithiasis, constipation. Patient symptoms and physical exam to warrant patient obtaining a CT scan of the abdomen and pelvis with IV and oral contrast to better assess anatomy. There is no hematuria on urinalysis today. Will obtain urine culture. We'll hold on treatment as patient does not display any classic UTI symptoms. Patient overall clinically stable currently. Will reassess pending results of CT scan.    The patient and/or caregiver has been counseled thoroughly with regard to treatment plan and/or medications prescribed including dosage, schedule, interactions, rationale for use, and  possible side effects and they verbalize understanding. Diagnoses and expected course of recovery discussed and will return if not improved as expected or if the condition worsens. Patient and/or caregiver verbalized understanding.             Doree Albee, MD 01/19/13 1051

## 2013-01-19 NOTE — ED Notes (Signed)
Stephanie [name] c/o lower abdominal pain in the center of her abdomen x last night. Pain is intermittent and described as sharp and dull. C/o mild nausea and chills. Denis diarrhea or vomiting. She has been under recent increased stress.

## 2013-01-19 NOTE — ED Provider Notes (Signed)
History     CSN: [id number]  Arrival date & time 01/19/13  7829   First MD Initiated Contact with Patient 01/19/13 1002      Chief Complaint  Patient presents with  . Abdominal Injury    (Consider location/radiation/quality/duration/timing/severity/associated sxs/prior treatment) HPI  Past Medical History  Diagnosis Date  . Plantar fasciitis   . Nephrolithiasis     Past Surgical History  Procedure Laterality Date  . Cholecystectomy  1982  . Tonsilectomy, adenoidectomy, bilateral myringotomy and tubes      at age 34 approx.   . Tubal ligation  1993    Family History  Problem Relation Age of Onset  . Coronary artery disease Other     family hx of/ heart valve replacement  . Alcohol abuse Father   . Cancer Father   . Prostate cancer Father   . COPD Father     History  Substance Use Topics  . Smoking status: Former Smoker -- 0.20 packs/day for 2 years    Types: Cigarettes    Quit date: 10/14/1976  . Smokeless tobacco: Not on file  . Alcohol Use: No    OB History   Grav Para Term Preterm Abortions TAB SAB Ect Mult Living                  Review of Systems  Allergies  Review of patient's allergies indicates no known allergies.  Home Medications   Current Outpatient Rx  Name  Route  Sig  Dispense  Refill  . ciprofloxacin (CIPRO) 750 MG tablet   Oral   Take 1 tablet (750 mg total) by mouth 2 (two) times daily.   14 tablet   0   . metroNIDAZOLE (FLAGYL) 500 MG tablet      Take one tab by mouth every 6 hours   28 tablet   0     BP 100/68  Pulse 106  Temp(Src) 97.9 F (36.6 C) (Oral)  Resp 14  Ht 5\' 1"  (1.549 m)  Wt 210 lb (95.255 kg)  BMI 39.7 kg/m2  SpO2 96%  Physical Exam  ED Course  Procedures (including critical care time)  Labs Reviewed  URINE CULTURE  POCT URINALYSIS DIP (MANUAL ENTRY)   Ct Abdomen Pelvis W Contrast  01/19/2013  *RADIOLOGY REPORT*  Clinical Data: Lower abdominal pain.  Pelvic pain.  CT ABDOMEN AND PELVIS WITH  CONTRAST  Technique:  Multidetector CT imaging of the abdomen and pelvis was performed following the standard protocol during bolus administration of intravenous contrast.  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  The liver, spleen, pancreas, and adrenal glands appear unremarkable.  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  The kidneys otherwise unremarkable.  Gallbladder surgically absent.  Retroaortic left renal vein.  Abnormal wall thickening and adjacent mesenteric stranding along the sigmoid colon shown on image she is 57 of series 2.  Small amount of adjacent mesenteric fluid observed, image 41 of series 5, without overt abscess.  No extraluminal gas.  Possible inflamed diverticulum on image 42 of series 5, although no other sigmoid diverticula are visible.  Trace fluid in the cul-de-sac.  The uterus and adnexa appear unremarkable.  Urinary bladder unremarkable.  IMPRESSION:  1.  Sigmoid colon inflammation, probably due to an inflamed diverticulum, with associated wall thickening, mesenteric stranding, and a small amount of adjacent ascites.  No abscess or extraluminal gas.  It would likely be prudent to follow-up with colonoscopy or sigmoidoscopy if not recently performed to exclude the underlying possibility of a sigmoid colon tumor after resolution of the patient's acute issues. 2.  Small splenic artery aneurysm. 3.  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   Original Report Authenticated By: Gaylyn Rong, M.D.      1. Abdominal  pain, other specified site; suspect diverticulitis   2. Splenic artery aneurysm       MDM  Discussed CT scan results with patient.  Begin Cipro and Flagyl for one week. Begin clear liquids for about 24 hours, then may begin a BRAT diet when improved.  Then gradually advance to a regular diet as tolerated.  Avoid  milk products until well. If symptoms become significantly worse during the night or over the weekend, proceed to the local emergency room.  Followup with Dr. Linford Arnold in 48 hours.  Will need followup colonoscopy when present symptoms resolved  Incidental finding of small splenic artery aneurysm on CT scan.  Recommend consultation with vascular surgeon.         Lattie Haw, MD 01/19/13 1414

## 2013-01-20 LAB — URINE CULTURE
Colony Count: NO GROWTH
Organism ID, Bacteria: NO GROWTH

## 2013-01-21 ENCOUNTER — Encounter: Payer: Self-pay | Admitting: Family Medicine

## 2013-01-21 ENCOUNTER — Telehealth: Payer: Self-pay | Admitting: Emergency Medicine

## 2013-01-21 ENCOUNTER — Encounter: Payer: Self-pay | Admitting: Gastroenterology

## 2013-01-21 ENCOUNTER — Ambulatory Visit (INDEPENDENT_AMBULATORY_CARE_PROVIDER_SITE_OTHER): Payer: BC Managed Care – PPO | Admitting: Family Medicine

## 2013-01-21 VITALS — BP 110/82 | Temp 97.6°F | Wt 210.0 lb

## 2013-01-21 DIAGNOSIS — K5792 Diverticulitis of intestine, part unspecified, without perforation or abscess without bleeding: Secondary | ICD-10-CM

## 2013-01-21 DIAGNOSIS — R110 Nausea: Secondary | ICD-10-CM

## 2013-01-21 DIAGNOSIS — I728 Aneurysm of other specified arteries: Secondary | ICD-10-CM

## 2013-01-21 DIAGNOSIS — K5732 Diverticulitis of large intestine without perforation or abscess without bleeding: Secondary | ICD-10-CM | POA: Insufficient documentation

## 2013-01-21 HISTORY — DX: Diverticulitis of intestine, part unspecified, without perforation or abscess without bleeding: K57.92

## 2013-01-21 MED ORDER — AMOXICILLIN-POT CLAVULANATE 875-125 MG PO TABS: 1.0000 | ORAL_TABLET | Freq: Two times a day (BID) | ORAL | Status: DC

## 2013-01-21 MED ORDER — ONDANSETRON HCL 8 MG PO TABS: 8.0000 mg | ORAL_TABLET | Freq: Three times a day (TID) | ORAL | Status: DC | PRN

## 2013-01-21 NOTE — Progress Notes (Signed)
CC: Stephanie [name] is a 57 y.o. female is here for F/u diverticulitis   Subjective: HPI:  Followup diverticulitis: Patient has been on Cipro and Flagyl for the past 48 hours. She reports resolution of fevers, chills, fatigue, lack of appetite. She is tolerating clear fluids, mashed potatoes, and applesauce. She still has mild suprapubic pain which is significantly improved since presentation on the 8th. Her only complaint is nausea soon after dosing of metronidazole, accompanied by a odd taste in her mouth.  This is been present since starting metronidazole. She denies abdominal pain, vomiting, diarrhea, back pain, flank pain.  Describes the nausea as moderate in severity. Nothing makes it better, only worse with metronidazole. Present all hours of the day to some degree.  Patient was found to have splenic artery aneurysm on CT contrast.  Review Of Systems Outlined In HPI  Past Medical History  Diagnosis Date  . Plantar fasciitis   . Nephrolithiasis      Family History  Problem Relation Age of Onset  . Coronary artery disease Other     family hx of/ heart valve replacement  . Alcohol abuse Father   . Cancer Father   . Prostate cancer Father   . COPD Father      History  Substance Use Topics  . Smoking status: Former Smoker -- 0.20 packs/day for 2 years    Types: Cigarettes    Quit date: 10/14/1976  . Smokeless tobacco: Not on file  . Alcohol Use: No     Objective: Filed Vitals:   01/21/13 0915  BP: 110/82  Temp: 97.6 F (36.4 C)    Vital signs reviewed. General: Alert and Oriented, No Acute Distress HEENT: Pupils equal, round, reactive to light. Conjunctivae clear.  External ears unremarkable.  Moist mucous membranes. Lungs: Clear and comfortable work of breathing, speaking in full sentences without accessory muscle use. Cardiac: Regular rate and rhythm.  Neuro: CN II-XII grossly intact, gait normal. Extremities: No peripheral edema.  Strong peripheral pulses.   Abdomen: Normal bowel sounds, soft, no guarding, mild left lower quadrant pain to deep palpation with rebound. No pain or palpable masses elsewhere. Mental Status: No depression, anxiety, nor agitation. Logical though process. Skin: Warm and dry. Assessment & Plan: Stephanie [name] was seen today for f/u diverticulitis.  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  Nausea alone - ondansetron (ZOFRAN) 8 MG tablet; Take 1 tablet (8 mg total) by mouth every 8 (eight) hours as needed for nausea.    Incidental splenic artery aneurysm: Referral to vascular surgery for further management, blood pressure currently well-controlled Diverticulitis: Improving, continue antimicrobial regimen for at least 7 days, slowly introduce BRAT diet. She is overdue for colonoscopy beyond age 50 and radiology report recommends colonoscopy once acute episode is resolved, referral has been placed Nausea: Discussed this is likely due at least in part to metronidazole use. Discussed success history with Cipro and Flagyl regimen for diverticulitis, if nausea becomes intolerable may switch to Augmentin, for the time being try Zofran Signs and symptoms requring emergent/urgent reevaluation were discussed with the patient.  25 minutes spent face-to-face during visit today of which at least 50% was counseling or coordinating care regarding diverticulitis, splenic artery aneurysm, nausea.    Return if symptoms worsen or fail to improve.

## 2013-01-26 ENCOUNTER — Encounter: Payer: Self-pay | Admitting: Vascular Surgery

## 2013-01-27 ENCOUNTER — Ambulatory Visit (INDEPENDENT_AMBULATORY_CARE_PROVIDER_SITE_OTHER): Payer: BC Managed Care – PPO | Admitting: Vascular Surgery

## 2013-01-27 ENCOUNTER — Encounter: Payer: Self-pay | Admitting: Family Medicine

## 2013-01-27 ENCOUNTER — Encounter: Payer: Self-pay | Admitting: Vascular Surgery

## 2013-01-27 VITALS — BP 110/49 | HR 84 | Ht 61.0 in | Wt 212.8 lb

## 2013-01-27 DIAGNOSIS — I728 Aneurysm of other specified arteries: Secondary | ICD-10-CM

## 2013-01-27 NOTE — Assessment & Plan Note (Signed)
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 I think that the risk of this rupturing is very small and that it is unlikely that she will require elective repair. Given that the aneurysm is near the hilum, repair could potentially require splenectomy which would be associated with some risk given her obesity. I do not think this could be addressed with an endovascular stent graft. I have recommended a follow up CT scan in one year. I suspect that she's had this for a long time and that if this remained stable we can gradually increase her follow up interval. We have discussed the she developed sudden onset of abdominal pain she would need to get to the emergency department where a CT scan could quickly determine if this pain was related to her aneurysm.

## 2013-01-27 NOTE — Progress Notes (Signed)
[facility] [place]  Patient name: Stephanie [name] MRN: [id number] DOB: 28-Jul-1956 Sex: female  REASON FOR CONSULT: splenic artery aneurysm. Referred by Dr. Ivan Anchors  HPI: Stephanie [name] is a 57 y.o. female who had been having abdominal pain. This prompted a CT scan. She was found to have evidence of diverticular disease for which she has been treated. However, an incidental finding on her CT scan was a small splenic artery aneurysm. She was referred for vascular consultation.  Her abdominal pain was gradual in onset. She was treated for diverticular disease with antibiotics and her pain has resolved. She's had no further abdominal pain. She has no family history of aneurysmal disease.  Past Medical History  Diagnosis Date  . Plantar fasciitis   . Nephrolithiasis    She denies any history of diabetes, hypertension, hypercholesterolemia, history of previous myocardial infarction, or history of congestive heart failure.  Family History  Problem Relation Age of Onset  . Coronary artery disease Other     family hx of/ heart valve replacement  . Alcohol abuse Father   . Cancer Father   . Prostate cancer Father   . COPD Father   . Hyperlipidemia Father   . Cancer Mother   . Hyperlipidemia Mother    There is no family history of premature cardiovascular disease.  SOCIAL HISTORY: History  Substance Use Topics  . Smoking status: Former Smoker -- 0.20 packs/day for 2 years    Types: Cigarettes    Quit date: 10/14/1976  . Smokeless tobacco: Never Used  . Alcohol Use: No    No Known Allergies  Current Outpatient Prescriptions  Medication Sig Dispense Refill  . amoxicillin-clavulanate (AUGMENTIN) 875-125 MG per tablet Take 1 tablet by mouth 2 (two) times daily.  12 tablet  0  . ciprofloxacin (CIPRO) 750 MG tablet Take 1 tablet (750 mg total) by mouth 2 (two) times daily.  14 tablet  0  . metroNIDAZOLE (FLAGYL) 500 MG tablet Take one tab by mouth every 6 hours  28  tablet  0  . ondansetron (ZOFRAN) 8 MG tablet Take 1 tablet (8 mg total) by mouth every 8 (eight) hours as needed for nausea.  20 tablet  0   No current facility-administered medications for this visit.    REVIEW OF SYSTEMS: Arly.Keller ] denotes positive finding; [  ] denotes negative finding  CARDIOVASCULAR:  [ ]  chest pain   [ ]  chest pressure   [ ]  palpitations   [ ]  orthopnea   [ ]  dyspnea on exertion   [ ]  claudication   [ ]  rest pain   [ ]  DVT   [ ]  phlebitis PULMONARY:   [ ]  productive cough   [ ]  asthma   [ ]  wheezing NEUROLOGIC:   [ ]  weakness  [ ]  paresthesias  [ ]  aphasia  [ ]  amaurosis  [ ]  dizziness HEMATOLOGIC:   [ ]  bleeding problems   [ ]  clotting disorders MUSCULOSKELETAL:  [ ]  joint pain   [ ]  joint swelling [ ]  leg swelling GASTROINTESTINAL: [ ]   blood in stool  [ ]   hematemesis GENITOURINARY:  [ ]   dysuria  [ ]   hematuria PSYCHIATRIC:  [ ]  history of major depression INTEGUMENTARY:  [ ]  rashes  [ ]  ulcers CONSTITUTIONAL:  [ ]  fever   [ ]  chills  PHYSICAL EXAM: Filed Vitals:   01/27/13 0931  BP: 110/49  Pulse: 84  Height: 5\' 1"  (1.549 m)  Weight:  212 lb 12.8 oz (96.525 kg)  SpO2: 100%   Body mass index is 40.23 kg/(m^2). GENERAL: The patient is a well-nourished female, in no acute distress. The vital signs are documented above. CARDIOVASCULAR: There is a regular rate and rhythm. I do not detect carotid bruits. She has palpable dorsalis pedis pulses bilaterally. She has mild bilateral lower extremity swelling. PULMONARY: There is good air exchange bilaterally without wheezing or rales. ABDOMEN: Soft and non-tender with normal pitched bowel sounds. I do not detect an abdominal bruit. MUSCULOSKELETAL: There are no major deformities or cyanosis. NEUROLOGIC: No focal weakness or paresthesias are detected. SKIN: There are no ulcers or rashes noted.Asst. Small spider veins bilaterally. PSYCHIATRIC: The patient has a normal affect.  DATA:  I have reviewed her records from  Dr.Hommel's office. Her diverticulitis was successfully treated with antibiotics.  I have reviewed her CT scan which shows a 1.3 cm calcified splenic artery aneurysm near the hilum of the spleen. This also showed sigmoid colon inflammation consistent with diverticular disease.  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 I think that the risk of this rupturing is very small and that it is unlikely that she will require elective repair. Given that the aneurysm is near the hilum, repair could potentially require splenectomy which would be associated with some risk given her obesity. I do not think this could be addressed with an endovascular stent graft. I have recommended a follow up CT scan in one year. I suspect that she's had this for a long time and that if this remained stable we can gradually increase her follow up interval. We have discussed the she developed sudden onset of abdominal pain she would need to get to the emergency department where a CT scan could quickly determine if this pain was related to her aneurysm.   Keimya Briddell S [facility] [place] Beeper: [phone]

## 2013-01-27 NOTE — Addendum Note (Signed)
Addended by: Adria Dill L on: 01/27/2013 04:52 PM   Modules accepted: Orders

## 2013-03-15 ENCOUNTER — Encounter: Payer: Self-pay | Admitting: Gastroenterology

## 2013-03-15 ENCOUNTER — Ambulatory Visit (AMBULATORY_SURGERY_CENTER): Payer: BC Managed Care – PPO | Admitting: *Deleted

## 2013-03-15 VITALS — Ht 61.0 in | Wt 205.2 lb

## 2013-03-15 DIAGNOSIS — Z1211 Encounter for screening for malignant neoplasm of colon: Secondary | ICD-10-CM

## 2013-03-15 MED ORDER — MOVIPREP 100 G PO SOLR: ORAL | Status: DC

## 2013-03-29 ENCOUNTER — Encounter: Payer: Self-pay | Admitting: Gastroenterology

## 2013-03-29 ENCOUNTER — Ambulatory Visit (AMBULATORY_SURGERY_CENTER): Payer: BC Managed Care – PPO | Admitting: Gastroenterology

## 2013-03-29 VITALS — BP 111/50 | HR 58 | Temp 96.4°F | Resp 13 | Ht 61.0 in | Wt 205.0 lb

## 2013-03-29 DIAGNOSIS — Z1211 Encounter for screening for malignant neoplasm of colon: Secondary | ICD-10-CM

## 2013-03-29 MED ORDER — SODIUM CHLORIDE 0.9 % IV SOLN: 500.0000 mL | INTRAVENOUS | Status: DC

## 2013-03-29 NOTE — Progress Notes (Signed)
Patient did not have preoperative order for IV antibiotic SSI prophylaxis. (G8918)  Patient did not experience any of the following events: a burn prior to discharge; a fall within the facility; wrong site/side/patient/procedure/implant event; or a hospital transfer or hospital admission upon discharge from the facility. (G8907)  

## 2013-03-29 NOTE — Op Note (Signed)
[facility] [address] [place], [address]   COLONOSCOPY PROCEDURE REPORT  PATIENT: Stephanie [name], Stephanie [name]  MR#: [id number] BIRTHDATE: 03/05/1956 , 56  yrs. old GENDER: Female ENDOSCOPIST: Mardella Layman, MD, Clementeen Graham REFERRED BY:  Nani Gasser, M.D. PROCEDURE DATE:  03/29/2013 PROCEDURE:   Colonoscopy, screening ASA CLASS:   Class II INDICATIONS:average risk screening. MEDICATIONS: propofol (Diprivan) 300mg  IV  DESCRIPTION OF PROCEDURE:   After the risks and benefits and of the procedure were explained, informed consent was obtained.  A digital rectal exam revealed no abnormalities of the rectum.    The LB [id number]  endoscope was introduced through the anus and advanced to the cecum, which was identified by both the appendix and ileocecal valve .  The quality of the prep was excellent, using MoviPrep .  The instrument was then slowly withdrawn as the colon was fully examined.     COLON FINDINGS: A normal appearing cecum, ileocecal valve, and appendiceal orifice were identified.  The ascending, hepatic flexure, transverse, splenic flexure, descending, sigmoid colon and rectum appeared unremarkable.  No polyps or cancers were seen. Retroflexed views revealed no abnormalities.     The scope was then withdrawn from the patient and the procedure completed.  COMPLICATIONS: There were no complications. ENDOSCOPIC IMPRESSION: Normal colon...no polyps noted.  RECOMMENDATIONS: 1.  Continue current colorectal screening recommendations for "routine risk" patients with a repeat colonoscopy in 10 years. 2.  Continue current medications   REPEAT EXAM:  cc:  _______________________________ eSignedMardella Layman, MD, [facility] 03/29/2013 8:57 AM

## 2013-03-29 NOTE — Patient Instructions (Addendum)
YOU HAD AN ENDOSCOPIC PROCEDURE TODAY AT THE [facility]: Refer to the procedure report that was given to you for any specific questions about what was found during the examination.  If the procedure report does not answer your questions, please call your gastroenterologist to clarify.  If you requested that your care partner not be given the details of your procedure findings, then the procedure report has been included in a sealed envelope for you to review at your convenience later.  YOU SHOULD EXPECT: Some feelings of bloating in the abdomen. Passage of more gas than usual.  Walking can help get rid of the air that was put into your GI tract during the procedure and reduce the bloating. If you had a lower endoscopy (such as a colonoscopy or flexible sigmoidoscopy) you may notice spotting of blood in your stool or on the toilet paper. If you underwent a bowel prep for your procedure, then you may not have a normal bowel movement for a few days.  DIET: Your first meal following the procedure should be a light meal and then it is ok to progress to your normal diet.  A half-sandwich or bowl of soup is an example of a good first meal.  Heavy or fried foods are harder to digest and may make you feel nauseous or bloated.  Likewise meals heavy in dairy and vegetables can cause extra gas to form and this can also increase the bloating.  Drink plenty of fluids but you should avoid alcoholic beverages for 24 hours.  ACTIVITY: Your care partner should take you home directly after the procedure.  You should plan to take it easy, moving slowly for the rest of the day.  You can resume normal activity the day after the procedure however you should NOT DRIVE or use heavy machinery for 24 hours (because of the sedation medicines used during the test).    SYMPTOMS TO REPORT IMMEDIATELY: A gastroenterologist can be reached at any hour.  During normal business hours, 8:30 AM to 5:00 PM Monday through Friday,  call [phone].  After hours and on weekends, please call the GI answering service at [phone] who will take a message and have the physician on call contact you.   Following lower endoscopy (colonoscopy or flexible sigmoidoscopy):  Excessive amounts of blood in the stool  Significant tenderness or worsening of abdominal pains  Swelling of the abdomen that is new, acute  Fever of 100F or higher    FOLLOW UP: If any biopsies were taken you will be contacted by phone or by letter within the next 1-3 weeks.  Call your gastroenterologist if you have not heard about the biopsies in 3 weeks.  Our staff will call the home number listed on your records the next business day following your procedure to check on you and address any questions or concerns that you may have at that time regarding the information given to you following your procedure. This is a courtesy call and so if there is no answer at the home number and we have not heard from you through the emergency physician on call, we will assume that you have returned to your regular daily activities without incident.  SIGNATURES/CONFIDENTIALITY: You and/or your care partner have signed paperwork which will be entered into your electronic medical record.  These signatures attest to the fact that that the information above on your After Visit Summary has been reviewed and is understood.  Full responsibility of the confidentiality   of this discharge information lies with you and/or your care-partner.     

## 2013-03-30 ENCOUNTER — Telehealth: Payer: Self-pay

## 2013-03-30 NOTE — Telephone Encounter (Signed)
  Follow up Call-  Call back number 03/29/2013  Post procedure Call Back phone  # [phone]  Permission to leave phone message Yes     Patient questions:  Do you have a fever, pain , or abdominal swelling? no Pain Score  0 *  Have you tolerated food without any problems? yes  Have you been able to return to your normal activities? yes  Do you have any questions about your discharge instructions: Diet   no Medications  no Follow up visit  no  Do you have questions or concerns about your Care? no  Actions: * If pain score is 4 or above: No action needed, pain <4.   Per the pt "everything was splended". Maw

## 2013-04-23 ENCOUNTER — Ambulatory Visit (INDEPENDENT_AMBULATORY_CARE_PROVIDER_SITE_OTHER): Payer: BC Managed Care – PPO | Admitting: Family Medicine

## 2013-04-23 ENCOUNTER — Encounter: Payer: Self-pay | Admitting: Family Medicine

## 2013-04-23 VITALS — BP 114/67 | HR 88 | Temp 98.3°F | Wt 205.0 lb

## 2013-04-23 DIAGNOSIS — R05 Cough: Secondary | ICD-10-CM

## 2013-04-23 DIAGNOSIS — A499 Bacterial infection, unspecified: Secondary | ICD-10-CM

## 2013-04-23 DIAGNOSIS — B9689 Other specified bacterial agents as the cause of diseases classified elsewhere: Secondary | ICD-10-CM

## 2013-04-23 DIAGNOSIS — J329 Chronic sinusitis, unspecified: Secondary | ICD-10-CM

## 2013-04-23 MED ORDER — HYDROCODONE-HOMATROPINE 5-1.5 MG/5ML PO SYRP: 5.0000 mL | ORAL_SOLUTION | Freq: Every evening | ORAL | Status: DC | PRN

## 2013-04-23 MED ORDER — DOXYCYCLINE HYCLATE 100 MG PO TABS: ORAL_TABLET | ORAL | Status: AC

## 2013-04-23 NOTE — Progress Notes (Signed)
CC: Stephanie [name] is a 57 y.o. female is here for URI   Subjective: HPI:  Patient complains of subjective fevers and chills with facial pain localized above and below the eyes bilateral of moderate severity worsen or a daily basis since onset this past weekend. Facial pain worsened with leaning forward or exertion, slightly improved with over-the-counter cold medicine. Has been associated with subjective postnasal drip, and cough in the night which is nonproductive but keeps her awake. She denies shortness of breath, wheezing, blood in sputum, chest pain, back pain, hearing loss, headache, motor or sensory disturbances, dizziness or confusion   Review Of Systems Outlined In HPI  Past Medical History  Diagnosis Date  . Plantar fasciitis   . Nephrolithiasis   . Diverticulitis 01/21/13     Family History  Problem Relation Age of Onset  . Coronary artery disease Other     family hx of/ heart valve replacement  . Alcohol abuse Father   . Cancer Father   . Prostate cancer Father   . COPD Father   . Hyperlipidemia Father   . Cancer Mother   . Hyperlipidemia Mother   . Colon cancer Neg Hx      History  Substance Use Topics  . Smoking status: Former Smoker -- 0.20 packs/day for 2 years    Types: Cigarettes    Quit date: 10/14/1976  . Smokeless tobacco: Never Used  . Alcohol Use: No     Objective: Filed Vitals:   04/23/13 1012  BP: 114/67  Pulse: 88  Temp: 98.3 F (36.8 C)    General: Alert and Oriented, No Acute Distress HEENT: Pupils equal, round, reactive to light. Conjunctivae clear.  External ears unremarkable, canals clear with intact TMs with appropriate landmarks.  Middle ear appears open without effusion. Boggy erythematous inferior turbinates.  Moist mucous membranes, pharynx without inflammation nor lesions however moderate cobblestoning.  Neck supple without palpable lymphadenopathy nor abnormal masses. Frontal sinus tenderness to percussion bilaterally Lungs:  Clear to auscultation bilaterally, no  wheezing/ronchi/rales.  Comfortable work of breathing. Good air movement. Cardiac: Regular rate and rhythm. Normal S1/S2.  No murmurs, rubs, nor gallops.   Extremities: No peripheral edema.  Strong peripheral pulses.  Mental Status: No depression, anxiety, nor agitation. Skin: Warm and dry.  Assessment & Plan: Hidaya was seen today for uri.  Diagnoses and associated orders for this visit:  Bacterial sinusitis - doxycycline (VIBRA-TABS) 100 MG tablet; One by mouth twice a day for ten days. - HYDROcodone-homatropine (HYCODAN) 5-1.5 MG/5ML syrup; Take 5 mLs by mouth at bedtime as needed for cough.  Cough - doxycycline (VIBRA-TABS) 100 MG tablet; One by mouth twice a day for ten days. - HYDROcodone-homatropine (HYCODAN) 5-1.5 MG/5ML syrup; Take 5 mLs by mouth at bedtime as needed for cough.    Bacterial sinusitis: Start doxycycline encouraged use nasal saline washes Alka-Seltzer cold and sinus as needed, encouraged to use Hycodan only as needed for sleep.Signs and symptoms requring emergent/urgent reevaluation were discussed with the patient.  Return if symptoms worsen or fail to improve.

## 2013-05-16 ENCOUNTER — Emergency Department (INDEPENDENT_AMBULATORY_CARE_PROVIDER_SITE_OTHER)
Admission: EM | Admit: 2013-05-16 | Discharge: 2013-05-16 | Disposition: A | Discharge status: Home / Self Care | Payer: BC Managed Care – PPO | Source: Home / Self Care | Attending: Family Medicine | Admitting: Family Medicine

## 2013-05-16 ENCOUNTER — Emergency Department (INDEPENDENT_AMBULATORY_CARE_PROVIDER_SITE_OTHER): Payer: BC Managed Care – PPO

## 2013-05-16 DIAGNOSIS — M25579 Pain in unspecified ankle and joints of unspecified foot: Secondary | ICD-10-CM

## 2013-05-16 DIAGNOSIS — M25572 Pain in left ankle and joints of left foot: Secondary | ICD-10-CM

## 2013-05-16 MED ORDER — MELOXICAM 15 MG PO TABS: 15.0000 mg | ORAL_TABLET | Freq: Every day | ORAL | Status: DC

## 2013-05-16 NOTE — ED Provider Notes (Signed)
CSN: [id number]     Arrival date & time 05/16/13  1112 History     First MD Initiated Contact with Patient 05/16/13 1143     Chief Complaint  Patient presents with  . Ankle Pain    x 2 days      HPI Comments: Patient complains of aching pain in her left medial ankle for two days.  The pain is worse with movement.  No fevers, chills, and sweats.  She recalls no injury or changes in physical activity.  Patient is a 57 y.o. female presenting with ankle pain. The history is provided by the patient.  Ankle Pain Location:  Ankle Time since incident:  2 days Injury: no   Ankle location:  L ankle Pain details:    Quality:  Aching   Radiates to:  Does not radiate   Severity:  Mild   Onset quality:  Sudden   Duration:  2 days   Timing:  Constant   Progression:  Unchanged Chronicity:  New Prior injury to area:  No Relieved by:  Nothing Worsened by:  Bearing weight and activity Ineffective treatments:  None tried Associated symptoms: no back pain, no decreased ROM, no fever, no muscle weakness, no numbness, no stiffness, no swelling and no tingling   Risk factors: obesity     Past Medical History  Diagnosis Date  . Plantar fasciitis   . Nephrolithiasis   . Diverticulitis 01/21/13   Past Surgical History  Procedure Laterality Date  . Cholecystectomy  1982  . Tonsilectomy, adenoidectomy, bilateral myringotomy and tubes      at age 53 approx.   . Tubal ligation  1993   Family History  Problem Relation Age of Onset  . Coronary artery disease Other     family hx of/ heart valve replacement  . Alcohol abuse Father   . Cancer Father   . Prostate cancer Father   . COPD Father   . Hyperlipidemia Father   . Cancer Mother   . Hyperlipidemia Mother   . Colon cancer Neg Hx    History  Substance Use Topics  . Smoking status: Former Smoker -- 0.20 packs/day for 2 years    Types: Cigarettes    Quit date: 10/14/1976  . Smokeless tobacco: Never Used  . Alcohol Use: No   OB  History   Grav Para Term Preterm Abortions TAB SAB Ect Mult Living                 Review of Systems  Constitutional: Negative for fever.  Musculoskeletal: Negative for back pain and stiffness.  All other systems reviewed and are negative.    Allergies  Review of patient's allergies indicates no known allergies.  Home Medications   Current Outpatient Rx  Name  Route  Sig  Dispense  Refill  . HYDROcodone-homatropine (HYCODAN) 5-1.5 MG/5ML syrup   Oral   Take 5 mLs by mouth at bedtime as needed for cough.   120 mL   0   . meloxicam (MOBIC) 15 MG tablet   Oral   Take 1 tablet (15 mg total) by mouth daily. Take with food each morning   15 tablet   0    BP 112/76  Pulse 70  Temp(Src) 97.9 F (36.6 C) (Oral)  Ht 5\' 1"  (1.549 m)  Wt 205 lb (92.987 kg)  BMI 38.75 kg/m2  SpO2 98% Physical Exam  Nursing note and vitals reviewed. Constitutional: She is oriented to person, place, and time. She appears  well-developed and well-nourished. No distress.  Patient is obese (BMI 38.8)  HENT:  Head: Normocephalic.  Mouth/Throat: Oropharynx is clear and moist.  Eyes: Conjunctivae are normal. Pupils are equal, round, and reactive to light.  Neck: Neck supple.  Cardiovascular: Normal heart sounds.   Pulmonary/Chest: Breath sounds normal.  Abdominal: There is no tenderness.  Musculoskeletal: She exhibits tenderness. She exhibits no edema.       Left ankle: She exhibits normal range of motion, no swelling, no ecchymosis, no deformity, no laceration and normal pulse. Tenderness. Medial malleolus tenderness found. No lateral malleolus tenderness found.       Feet:  There is distinct point tenderness over the left ankle medial malleolus as noted on diagram.  The tenderness is maximal at the anterior/inferior edge of medial malleolus.  Area is slightly warm but not swollen.  No erythema  Lymphadenopathy:    She has no cervical adenopathy.  Neurological: She is alert and oriented to  person, place, and time.  Skin: Skin is warm and dry. No rash noted.    ED Course   Procedures none    Dg Ankle Complete Left  05/16/2013   *RADIOLOGY REPORT*  Clinical Data: Ankle pain, tenderness over medial malleolus.  LEFT ANKLE COMPLETE - 3+ VIEW  Comparison: None.  Findings: No acute bony abnormality.  Specifically, no fracture, subluxation, or dislocation.  Soft tissues are intact. Joint spaces are maintained.  Normal bone mineralization.  Plantar calcaneal spur.  IMPRESSION: No acute bony abnormality.   Original Report Authenticated By: Charlett Nose, M.D.   1. Ankle pain, left; suspect acute gout     MDM   Rx for Mobic 15mg  Take Ibuprofen today, then begin Mobic tomorrow morning Followup with Family Doctor if not improved in one week.   Lattie Haw, MD 05/17/13 [phone]

## 2013-05-16 NOTE — ED Notes (Signed)
Stephanie [name] complains of an aching pain in her left ankle for 2 days. The pain is a 7/10. Worse with movement. Denies fever, chills or sweats.

## 2014-01-25 ENCOUNTER — Other Ambulatory Visit: Payer: Self-pay

## 2014-01-25 DIAGNOSIS — I728 Aneurysm of other specified arteries: Secondary | ICD-10-CM

## 2014-01-26 ENCOUNTER — Ambulatory Visit: Payer: BC Managed Care – PPO | Admitting: Vascular Surgery

## 2014-01-26 ENCOUNTER — Ambulatory Visit
Admission: RE | Admit: 2014-01-26 | Discharge: 2014-01-26 | Disposition: A | Discharge status: Home / Self Care | Payer: BC Managed Care – PPO | Source: Ambulatory Visit | Attending: Vascular Surgery | Admitting: Vascular Surgery

## 2014-01-26 DIAGNOSIS — I728 Aneurysm of other specified arteries: Secondary | ICD-10-CM

## 2014-01-26 MED ORDER — IOHEXOL 300 MG/ML  SOLN
125.0000 mL | Freq: Once | INTRAMUSCULAR | Status: AC | PRN
  Administered 2014-01-26: 125 mL via INTRAVENOUS

## 2014-02-01 ENCOUNTER — Encounter: Payer: Self-pay | Admitting: Vascular Surgery

## 2014-02-02 ENCOUNTER — Encounter: Payer: Self-pay | Admitting: Vascular Surgery

## 2014-02-02 ENCOUNTER — Ambulatory Visit (INDEPENDENT_AMBULATORY_CARE_PROVIDER_SITE_OTHER): Payer: BC Managed Care – PPO | Admitting: Vascular Surgery

## 2014-02-02 VITALS — BP 108/79 | HR 74 | Ht 61.0 in | Wt 225.4 lb

## 2014-02-02 DIAGNOSIS — I728 Aneurysm of other specified arteries: Secondary | ICD-10-CM

## 2014-02-02 NOTE — Progress Notes (Signed)
[facility] [place]  Patient name: Stephanie [name] MRN: [id number] DOB: 24-Mar-1956 Sex: female  REASON FOR VISIT: Follow up of splenic artery aneurysm  HPI: Stephanie [name] is a 58 y.o. female who I saw in consultation in one year ago with a splenic artery aneurysm. He comes in for a one year follow up visit. At that time the aneurysm was 1.3 cm in maximum diameter and was calcified. It was near the hilum of the spleen. This was an incidental finding on CT scan. I felt that the risk of rupture was very small and it was unlikely that she would require elective repair recommended a follow up visit in one year.  Since I saw her last she denies any significant abdominal pain or flank pain. She has had no further problems with diverticular disease which is why she had a CT scan a year ago to begin with.  Past Medical History  Diagnosis Date  . Plantar fasciitis   . Nephrolithiasis   . Diverticulitis 01/21/13   Family History  Problem Relation Age of Onset  . Coronary artery disease Other     family hx of/ heart valve replacement  . Alcohol abuse Father   . Cancer Father   . Prostate cancer Father   . COPD Father   . Hyperlipidemia Father   . Cancer Mother   . Hyperlipidemia Mother   . Colon cancer Neg Hx    SOCIAL HISTORY: History  Substance Use Topics  . Smoking status: Former Smoker -- 0.20 packs/day for 2 years    Types: Cigarettes    Quit date: 10/14/1976  . Smokeless tobacco: Never Used  . Alcohol Use: No   No Known Allergies Current Outpatient Prescriptions  Medication Sig Dispense Refill  . HYDROcodone-homatropine (HYCODAN) 5-1.5 MG/5ML syrup Take 5 mLs by mouth at bedtime as needed for cough.  120 mL  0  . meloxicam (MOBIC) 15 MG tablet Take 1 tablet (15 mg total) by mouth daily. Take with food each morning  15 tablet  0   No current facility-administered medications for this visit.   REVIEW OF SYSTEMS: Valu.Nieves ] denotes positive finding; [  ] denotes  negative finding  CARDIOVASCULAR:  [ ]  chest pain   [ ]  chest pressure   [ ]  palpitations   [ ]  orthopnea   [ ]  dyspnea on exertion   [ ]  claudication   [ ]  rest pain   [ ]  DVT   [ ]  phlebitis PULMONARY:   [ ]  productive cough   [ ]  asthma   [ ]  wheezing NEUROLOGIC:   [ ]  weakness  [ ]  paresthesias  [ ]  aphasia  [ ]  amaurosis  [ ]  dizziness HEMATOLOGIC:   [ ]  bleeding problems   [ ]  clotting disorders MUSCULOSKELETAL:  [ ]  joint pain   [ ]  joint swelling [ ]  leg swelling GASTROINTESTINAL: [ ]   blood in stool  [ ]   hematemesis GENITOURINARY:  [ ]   dysuria  [ ]   hematuria PSYCHIATRIC:  [ ]  history of major depression INTEGUMENTARY:  [ ]  rashes  [ ]  ulcers CONSTITUTIONAL:  [ ]  fever   [ ]  chills  PHYSICAL EXAM: Filed Vitals:   02/02/14 1049  BP: 108/79  Pulse: 74  Height: 5\' 1"  (1.549 m)  Weight: 225 lb 6.4 oz (102.241 kg)  SpO2: 99%   Body mass index is 42.61 kg/(m^2). GENERAL: The patient is a well-nourished female, in no acute distress.  The vital signs are documented above. CARDIOVASCULAR: There is a regular rate and rhythm.  PULMONARY: There is good air exchange bilaterally without wheezing or rales. ABDOMEN: Soft and non-tender with normal pitched bowel sounds.  MUSCULOSKELETAL: There are no major deformities or cyanosis. NEUROLOGIC: No focal weakness or paresthesias are detected. SKIN: There are no ulcers or rashes noted. PSYCHIATRIC: The patient has a normal affect.  DATA:  I reviewed her CT scan which shows a maximum diameter of her splenic artery aneurysm is 1.1 cm. This has not changed over the last year.  MEDICAL ISSUES: Splenic artery aneurysm Her splenic artery aneurysm remained stable in size. I think it is safe to stretcher follow up out to 3 years. I've ordered a CT of the abdomen in 3 years. I'll see her back at that time. There is no change at that time we can probably follow this at 5 year intervals. Unfortunately it cannot be followed simply with ultrasound.      Return in about 3 years (around 02/02/2017).  Angelia Mould [facility] [place] Beeper: [phone]

## 2014-02-02 NOTE — Assessment & Plan Note (Signed)
Her splenic artery aneurysm remained stable in size. I think it is safe to stretcher follow up out to 3 years. I've ordered a CT of the abdomen in 3 years. I'll see her back at that time. There is no change at that time we can probably follow this at 5 year intervals. Unfortunately it cannot be followed simply with ultrasound.

## 2014-11-02 ENCOUNTER — Encounter: Payer: Self-pay | Admitting: Cardiology

## 2015-01-17 DIAGNOSIS — Z0279 Encounter for issue of other medical certificate: Secondary | ICD-10-CM

## 2015-08-21 ENCOUNTER — Emergency Department (INDEPENDENT_AMBULATORY_CARE_PROVIDER_SITE_OTHER)
Admission: EM | Admit: 2015-08-21 | Discharge: 2015-08-21 | Disposition: A | Discharge status: Home / Self Care | Payer: BC Managed Care – PPO | Source: Home / Self Care | Attending: Family Medicine | Admitting: Family Medicine

## 2015-08-21 ENCOUNTER — Emergency Department (INDEPENDENT_AMBULATORY_CARE_PROVIDER_SITE_OTHER): Payer: BC Managed Care – PPO

## 2015-08-21 ENCOUNTER — Encounter: Payer: Self-pay | Admitting: Emergency Medicine

## 2015-08-21 DIAGNOSIS — M7662 Achilles tendinitis, left leg: Secondary | ICD-10-CM

## 2015-08-21 DIAGNOSIS — M25572 Pain in left ankle and joints of left foot: Secondary | ICD-10-CM | POA: Diagnosis not present

## 2015-08-21 MED ORDER — MELOXICAM 15 MG PO TABS: 15.0000 mg | ORAL_TABLET | Freq: Every day | ORAL | Status: DC

## 2015-08-21 MED ORDER — NITROGLYCERIN 0.2 MG/HR TD PT24: MEDICATED_PATCH | TRANSDERMAL | Status: DC

## 2015-08-21 NOTE — ED Notes (Signed)
Has pulled/streched left ankle a couple times in recent days and now pain with certain movements increasing.

## 2015-08-21 NOTE — ED Provider Notes (Signed)
CSN: [id number]     Arrival date & time 08/21/15  1024 History   First MD Initiated Contact with Patient 08/21/15 1046     Chief Complaint  Patient presents with  . Ankle Pain      HPI Comments: Patient has had mild pain in her left posterior ankle for about 6 months, worse when walking.  Yesterday while playing with her granddaughter, she pushed off with her left foot to run and felt sudden pain in her left posterior ankle that has persisted.  Patient is a 59 y.o. female presenting with ankle pain. The history is provided by the patient.  Ankle Pain Location:  Ankle Time since incident:  14 hours Injury: yes   Mechanism of injury comment:  Pushing of with her left ankle to run Ankle location:  L ankle Pain details:    Quality:  Aching and burning   Radiates to:  Does not radiate   Severity:  Moderate   Onset quality:  Sudden   Duration:  14 hours   Timing:  Constant   Progression:  Worsening Chronicity:  New Prior injury to area:  No Relieved by:  Nothing Worsened by:  Bearing weight and flexion Ineffective treatments:  NSAIDs and ice Associated symptoms: decreased ROM and stiffness   Associated symptoms: no muscle weakness, no numbness, no swelling and no tingling   Risk factors: obesity     Past Medical History  Diagnosis Date  . Plantar fasciitis   . Nephrolithiasis   . Diverticulitis 01/21/13   Past Surgical History  Procedure Laterality Date  . Cholecystectomy  1982  . Tonsilectomy, adenoidectomy, bilateral myringotomy and tubes      at age 89 approx.   . Tubal ligation  1993   Family History  Problem Relation Age of Onset  . Coronary artery disease Other     family hx of/ heart valve replacement  . Alcohol abuse Father   . Cancer Father   . Prostate cancer Father   . COPD Father   . Hyperlipidemia Father   . Cancer Mother   . Hyperlipidemia Mother   . Colon cancer Neg Hx    Social History  Substance Use Topics  . Smoking status: Former Smoker -- 0.20  packs/day for 2 years    Types: Cigarettes    Quit date: 10/14/1976  . Smokeless tobacco: Never Used  . Alcohol Use: No   OB History    No data available     Review of Systems  Musculoskeletal: Positive for stiffness.  All other systems reviewed and are negative.   Allergies  Review of patient's allergies indicates no known allergies.  Home Medications   Prior to Admission medications   Medication Sig Start Date End Date Taking? Authorizing Provider  HYDROcodone-homatropine (HYCODAN) 5-1.5 MG/5ML syrup Take 5 mLs by mouth at bedtime as needed for cough. 04/23/13   Marcial Pacas, DO  meloxicam (MOBIC) 15 MG tablet Take 1 tablet (15 mg total) by mouth daily. Take with food each morning 08/21/15   Kandra Nicolas, MD  nitroGLYCERIN [facility]) 0.2 mg/hr patch Apply 1/4 patch for 12 hours each day 08/21/15   Kandra Nicolas, MD   Meds Ordered and Administered this Visit  Medications - No data to display  BP 103/69 mmHg  Pulse 67  Temp(Src) 97.7 F (36.5 C) (Oral)  Resp 16  Ht 5\' 1"  (1.549 m)  Wt 220 lb (99.791 kg)  BMI 41.59 kg/m2  SpO2 96% No data found.  Physical Exam  Constitutional: She is oriented to person, place, and time. She appears well-developed and well-nourished. No distress.  Patient is obese (BMI 41.6)  HENT:  Head: Normocephalic.  Eyes: Pupils are equal, round, and reactive to light.  Pulmonary/Chest: No respiratory distress.  Musculoskeletal:       Left ankle: She exhibits decreased range of motion. She exhibits no swelling, no ecchymosis, no deformity, no laceration and normal pulse. Achilles tendon exhibits pain. Achilles tendon exhibits normal Thompson's test results.       Feet:  Left ankle has distinct tenderness to palpation posteriorly at the insertion of the achilles tendon.  Patient has pain with plantar flexion of the ankle.  Neurological: She is alert and oriented to person, place, and time.  Skin: Skin is warm and dry. No rash noted.  Nursing  note and vitals reviewed.   ED Course  Procedures  None   Imaging Review Dg Ankle Complete Left  08/21/2015  CLINICAL DATA:  59 year old female with posterior ankle pain for 1 week with no known injury. Initial encounter. EXAM: LEFT ANKLE COMPLETE - 3+ VIEW COMPARISON:  05/16/2013. FINDINGS: Chronic bulky degenerative osteophytosis at the posterior calcaneus. The calcaneus appears intact. Mortise joint alignment preserved. Talar dome intact. No definite joint effusion. Cortical irregularity about the medial and lateral malleolus appears chronic and stable. No acute fracture or dislocation identified. 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 No acute osseous abnormality identified. Electronically Signed   By: Genevie Ann M.D.   On: 08/21/2015 11:23       MDM   1. Tendonitis, Achilles, left    Begin nitroglycerin patch 0.2mg /hr; 1/4 patch on for 12 hours daily. Begin Mobic 15mg  daily. Apply ice pack for 20 to 30 minutes, 3 to 4 times daily  Continue until pain decreases.  Begin stretching and range of motion exercises as tolerated. Instructions given Followup with Dr. Aundria Mems or Dr. Lynne Leader ([facility]) in two weeks.     Kandra Nicolas, MD 08/21/15 1140

## 2015-08-21 NOTE — Discharge Instructions (Signed)
Apply ice pack for 20 to 30 minutes, 3 to 4 times daily  Continue until pain decreases.  Begin stretching and range of motion exercises as tolerated.   Achilles Tendinitis Achilles tendinitis is inflammation of the tough, cord-like band that attaches the lower muscles of your leg to your heel (Achilles tendon). It is usually caused by overusing the tendon and joint involved.  CAUSES Achilles tendinitis can happen because of:  A sudden increase in exercise or activity (such as running).  Doing the same exercises or activities (such as jumping) over and over.  Not warming up calf muscles before exercising.  Exercising in shoes that are worn out or not made for exercise.  Having arthritis or a bone growth on the back of the heel bone. This can rub against the tendon and hurt the tendon. SIGNS AND SYMPTOMS The most common symptoms are:  Pain in the back of the leg, just above the heel. The pain usually gets worse with exercise and better with rest.  Stiffness or soreness in the back of the leg, especially in the morning.  Swelling of the skin over the Achilles tendon.  Trouble standing on tiptoe. Sometimes, an Achilles tendon tears (ruptures). Symptoms of an Achilles tendon rupture can include:  Sudden, severe pain in the back of the leg.  Trouble putting weight on the foot or walking normally. DIAGNOSIS Achilles tendinitis will be diagnosed based on symptoms and a physical examination. An X-ray may be done to check if another condition is causing your symptoms. An MRI may be ordered if your health care provider suspects you may have completely torn your tendon, which is called an Achilles tendon rupture.  TREATMENT  Achilles tendinitis usually gets better over time. It can take weeks to months to heal completely. Treatment focuses on treating the symptoms and helping the injury heal. HOME CARE INSTRUCTIONS   Rest your Achilles tendon and avoid activities that cause pain.  Apply  ice to the injured area:  Put ice in a plastic bag.  Place a towel between your skin and the bag.  Leave the ice on for 20 minutes, 2-3 times a day  Try to avoid using the tendon (other than gentle range of motion) while the tendon is painful. Do not resume use until instructed by your health care provider. Then begin use gradually. Do not increase use to the point of pain. If pain does develop, decrease use and continue the above measures. Gradually increase activities that do not cause discomfort until you achieve normal use.  Do exercises to make your calf muscles stronger and more flexible. Your health care provider or physical therapist can recommend exercises for you to do.  Wrap your ankle with an elastic bandage or other wrap. This can help keep your tendon from moving too much. Your health care provider will show you how to wrap your ankle correctly.  Only take over-the-counter or prescription medicines for pain, discomfort, or fever as directed by your health care provider. SEEK MEDICAL CARE IF:   Your pain and swelling increase or pain is uncontrolled with medicines.  You develop new, unexplained symptoms or your symptoms get worse.  You are unable to move your toes or foot.  You develop warmth and swelling in your foot.  You have an unexplained temperature. MAKE SURE YOU:   Understand these instructions.  Will watch your condition.  Will get help right away if you are not doing well or get worse.   This information is  not intended to replace advice given to you by your health care provider. Make sure you discuss any questions you have with your health care provider.   Document Released: 07/10/2005 Document Revised: 10/21/2014 Document Reviewed: 05/12/2013 Elsevier Interactive Patient Education [employer].

## 2015-09-06 ENCOUNTER — Ambulatory Visit (INDEPENDENT_AMBULATORY_CARE_PROVIDER_SITE_OTHER): Payer: BC Managed Care – PPO | Admitting: Family Medicine

## 2015-09-06 ENCOUNTER — Ambulatory Visit (INDEPENDENT_AMBULATORY_CARE_PROVIDER_SITE_OTHER): Payer: BC Managed Care – PPO

## 2015-09-06 ENCOUNTER — Encounter: Payer: Self-pay | Admitting: Family Medicine

## 2015-09-06 VITALS — BP 99/66 | HR 66 | Wt 229.0 lb

## 2015-09-06 DIAGNOSIS — M79609 Pain in unspecified limb: Secondary | ICD-10-CM | POA: Insufficient documentation

## 2015-09-06 DIAGNOSIS — M5416 Radiculopathy, lumbar region: Secondary | ICD-10-CM

## 2015-09-06 DIAGNOSIS — M7662 Achilles tendinitis, left leg: Secondary | ICD-10-CM | POA: Diagnosis not present

## 2015-09-06 DIAGNOSIS — M47896 Other spondylosis, lumbar region: Secondary | ICD-10-CM

## 2015-09-06 DIAGNOSIS — M1610 Unilateral primary osteoarthritis, unspecified hip: Secondary | ICD-10-CM

## 2015-09-06 DIAGNOSIS — M7661 Achilles tendinitis, right leg: Secondary | ICD-10-CM | POA: Insufficient documentation

## 2015-09-06 DIAGNOSIS — R202 Paresthesia of skin: Secondary | ICD-10-CM

## 2015-09-06 MED ORDER — MELOXICAM 15 MG PO TABS: 15.0000 mg | ORAL_TABLET | Freq: Every day | ORAL | Status: DC

## 2015-09-06 MED ORDER — OMEPRAZOLE 40 MG PO CPDR: 40.0000 mg | DELAYED_RELEASE_CAPSULE | Freq: Every day | ORAL | Status: DC

## 2015-09-06 NOTE — Progress Notes (Signed)
Quick Note:  Mild arthritis of the hips. ______

## 2015-09-06 NOTE — Progress Notes (Signed)
Subjective:    I'm seeing this patient as a consultation for:  Dr Assunta Found  CC: Left ankle pain and right thigh pain.   HPI:  1) left ankle pain. Patient has several month history of left posterior ankle pain. This occurred without injury and it has been worsening recently. She describes sharp pain with ambulation. She was seen by urgent care a few weeks ago who diagnosed her with Achilles tendinitis and heel spur. She was treated with meloxicam and nitroglycerin patches which have helped a lot. Symptoms are improved. No radiating pain weakness or numbness fevers or chills. 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 She tolerates but the meloxicam and the nitroglycerin patches well.  2) right anterior thigh pain. Patient has a several month history of pain felt in the right anterior thigh. She describes it as IT trainer. This occurs with rest and with activity. She denies any pain radiating below the knee. No weakness or numbness fevers or chills. No treatments tried yet. She is concerned she may have a pinched nerve in her low back. She's been told that she schedule degenerative disease in her cervical and lumbar spine based on a previous lumbar MRI and x-ray of her lumbar spine and C-spine.  Past medical history, Surgical history, Family history not pertinant except as noted below, Social history, Allergies, and medications have been entered into the medical record, reviewed, and no changes needed.   Review of Systems: No headache, visual changes, nausea, vomiting, diarrhea, constipation, dizziness, abdominal pain, skin rash, fevers, chills, night sweats, weight loss, swollen lymph nodes, body aches, joint swelling, muscle aches, chest pain, shortness of breath, mood changes, visual or auditory hallucinations.   Objective:    Filed Vitals:   09/06/15 0849  BP: 99/66  Pulse: 66   General: Well Developed, well nourished, and in no acute distress.   Neuro/Psych: Alert and oriented x3, extra-ocular muscles intact, able to move all 4 extremities, sensation grossly intact. Skin: Warm and dry, no rashes noted.  Respiratory: Not using accessory muscles, speaking in full sentences, trachea midline.  Cardiovascular: Pulses palpable, no extremity edema. Abdomen: Does not appear distended. MSK: Left ankle is normal-appearing with a slight nodule at the insertion of the Achilles tendon. This is mildly tender. No palpable defects felt within the tendon. Normal ankle motion. Normal strength. Normal gait.  Back: Nontender to spinal midline normal back range of motion. Negative straight leg raise test bilaterally. Lower extremity strength is intact throughout. Hip normal-appearing nontender. Normal hip range of motion. Negative Faber test. Normal gait.   X-ray left ankle reviewed  No results found for this or any previous visit (from the past 24 hour(s)). Dg Lumbar Spine Complete  09/06/2015  CLINICAL DATA:  Lumbar radiculopathy. EXAM: LUMBAR SPINE - COMPLETE 4+ VIEW COMPARISON:  None. FINDINGS: There is no evidence of lumbar spine fracture.  Alignment is normal. Mild vertebral osteophyte formation noted at multiple levels. Intervertebral disc spaces are maintained. Mild bilateral facet DJD is seen at L4-5 and L5-S1. No focal lytic or sclerotic bone lesions identified. Mild pubic symphysis degenerative changes also noted. IMPRESSION: No acute findings. Mild degenerative spondylosis, as described above. Electronically Signed   By: Earle Gell M.D.   On: 09/06/2015 09:49   Dg Pelvis 1-2 Views  09/06/2015  CLINICAL DATA:  Low back pain with tingling down the front of the right leg for 3-4 months, no injury EXAM: PELVIS - 1-2 VIEW COMPARISON:  CT abdomen and pelvis of 01/19/2013 FINDINGS: Only mild degenerative joint disease of the hips is seen with mild superior and inferior acetabular spurring. No acute fracture is seen. The pelvic rami are intact. The SI  joints are corticated. 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  No acute abnormality. Electronically Signed   By: Ivar Drape M.D.   On: 09/06/2015 09:45    Impression and Recommendations:   This case required medical decision making of moderate complexity.

## 2015-09-06 NOTE — Progress Notes (Signed)
Quick Note:  Back xray shows mild arthritis. ______

## 2015-09-06 NOTE — Assessment & Plan Note (Signed)
Symptoms consistent with the L3 or L4 right lumbar dermatome. Patient could also be due to some mild hip DJD. Plan for physical therapy and relative rest. If not improved would obtain MRI for plan for epidural steroid injection. Treat pain with meloxicam.

## 2015-09-06 NOTE — Patient Instructions (Signed)
Thank you for coming in today. Get xray back and pelvis today.  Do the exercises.  Return in 1 month.

## 2015-09-06 NOTE — Assessment & Plan Note (Signed)
Symptoms consistent with insertional Achilles tendinitis with heel spur. Improved with nitroglycerin patches. Continue nitroglycerin patches along with eccentric heel exercises. Return in one month. Represcribed meloxicam as well as omeprazole for GI prophylaxis.

## 2015-10-02 ENCOUNTER — Ambulatory Visit (INDEPENDENT_AMBULATORY_CARE_PROVIDER_SITE_OTHER): Payer: BC Managed Care – PPO

## 2015-10-02 ENCOUNTER — Encounter: Payer: Self-pay | Admitting: Family Medicine

## 2015-10-02 ENCOUNTER — Ambulatory Visit (INDEPENDENT_AMBULATORY_CARE_PROVIDER_SITE_OTHER): Payer: BC Managed Care – PPO | Admitting: Family Medicine

## 2015-10-02 VITALS — BP 133/76 | HR 68 | Wt 226.0 lb

## 2015-10-02 DIAGNOSIS — M5416 Radiculopathy, lumbar region: Secondary | ICD-10-CM

## 2015-10-02 DIAGNOSIS — M542 Cervicalgia: Secondary | ICD-10-CM

## 2015-10-02 DIAGNOSIS — M50322 Other cervical disc degeneration at C5-C6 level: Secondary | ICD-10-CM | POA: Diagnosis not present

## 2015-10-02 DIAGNOSIS — M7662 Achilles tendinitis, left leg: Secondary | ICD-10-CM

## 2015-10-02 NOTE — Assessment & Plan Note (Signed)
Improving. Continue eccentric exercises and nitroglycerin patches.

## 2015-10-02 NOTE — Progress Notes (Signed)
Quick Note:  Xray shows arthritis like we thought. ______ 

## 2015-10-02 NOTE — Assessment & Plan Note (Signed)
Lumbar radiculopathy likely. Worsens over the past month. Is likely at the L2 or 3 area. Plan for MRI. Follow-up after MRI.

## 2015-10-02 NOTE — Assessment & Plan Note (Signed)
Likely due to DDD. This is an acute exacerbation of her chronic neck pain. Lengthy discussion about the poor prognosis of chronic axial back and neck pain. However I'm optimistic that her acute exacerbation will respond well to physical therapy. Physical therapy ordered. Return in one month or sooner.

## 2015-10-02 NOTE — Progress Notes (Signed)
Stephanie [name] is a 59 y.o. female who presents to [facility] [facility]: Primary Care today for follow-up lumbar radiculopathy and Achilles tendinitis.  Patient was last seen on November 23. She was thought to have left Achilles tendinitis doing well with nitroglycerin patches and eccentric exercises. She notes this is improved and her pain is about 70-80% better. She is quite pleased with how things are going with her left ankle. She notes occasional sharp pains here and there.  Additionally patient was found to have right anterior thigh pain. This is worsened and has become tingling and numb occasionally. This is worse with back flexion. She denies any weakness or new dense numbness. No bowel bladder dysfunction.    Additionally patient had worsening of her chronic neck pain. She has chronic neck pain however the last few days it's been worse. She notes pain in her low cervical spine along the midline area. It's worse with neck motion. No radiating pain weakness or numbness. No injury.   Past Medical History  Diagnosis Date  . Plantar fasciitis   . Nephrolithiasis   . Diverticulitis 01/21/13   Past Surgical History  Procedure Laterality Date  . Cholecystectomy  1982  . Tonsilectomy, adenoidectomy, bilateral myringotomy and tubes      at age 22 approx.   . Tubal ligation  1993   Social History  Substance Use Topics  . Smoking status: Former Smoker -- 0.20 packs/day for 2 years    Types: Cigarettes    Quit date: 10/14/1976  . Smokeless tobacco: Never Used  . Alcohol Use: No   family history includes Alcohol abuse in her father; COPD in her father; Cancer in her father and mother; Coronary artery disease in her other; Hyperlipidemia in her father and mother; Prostate cancer in her father. There is no history of Colon cancer.  ROS as above Medications: Current Outpatient Prescriptions  Medication Sig  Dispense Refill  . meloxicam (MOBIC) 15 MG tablet Take 1 tablet (15 mg total) by mouth daily. Take with food each morning 30 tablet 0  . nitroGLYCERIN (MINITRAN) 0.2 mg/hr patch Apply 1/4 patch for 12 hours each day 30 patch 12  . omeprazole (PRILOSEC) 40 MG capsule Take 1 capsule (40 mg total) by mouth daily. 30 capsule 3   No current facility-administered medications for this visit.   No Known Allergies   Exam:  BP 133/76 mmHg  Pulse 68  Wt 226 lb (102.513 kg) Gen: Well NAD HEENT: EOMI,  MMM Lungs: Normal work of breathing. CTABL Heart: RRR no MRG Abd: NABS, Soft. Nondistended, Nontender Exts: Brisk capillary refill, warm and well perfused.  Neck is nontender to midline. Tender to palpation bilateral cervical paraspinals in the low cervical spine. Range of motion is limited in all planes by pain. Upper extremity strength is intact throughout. Reflexes are equal and normal bilateral upper extremities. Sensation is intact throughout as is capillary refill and pulses in the upper extremities bilaterally. Back is nontender with normal motion. Lower sore strength is intact throughout. Reflexes are equal and normal bilateral extremity. Normal gait.  No results found for this or any previous visit (from the past 24 hour(s)). Dg Cervical Spine Complete  10/02/2015  CLINICAL DATA:  Cervical spine pain for a few days. No known injury. No extremity symptoms. Initial encounter. EXAM: CERVICAL SPINE - COMPLETE 4+ VIEW COMPARISON:  None. FINDINGS: Vertebral body height and alignment are maintained. Loss of disc space height and endplate spurring are  most notable at C5-6. Prevertebral soft tissues appear normal. The lung apices are clear. IMPRESSION: No acute abnormality. Degenerative disease appearing most notable at C5-6. Electronically Signed   By: Inge Rise M.D.   On: 10/02/2015 10:08     Please see individual assessment and plan sections.

## 2015-10-02 NOTE — Patient Instructions (Signed)
Thank you for coming in today. Get xray today,.  Go to PT.  Return following MRI with CD.   Continue ankle exercises

## 2015-10-04 ENCOUNTER — Ambulatory Visit: Payer: BC Managed Care – PPO | Admitting: Family Medicine

## 2015-10-10 ENCOUNTER — Ambulatory Visit (INDEPENDENT_AMBULATORY_CARE_PROVIDER_SITE_OTHER): Payer: BC Managed Care – PPO

## 2015-10-10 DIAGNOSIS — M47896 Other spondylosis, lumbar region: Secondary | ICD-10-CM | POA: Diagnosis not present

## 2015-10-10 DIAGNOSIS — M5416 Radiculopathy, lumbar region: Secondary | ICD-10-CM

## 2015-10-10 NOTE — Progress Notes (Signed)
Quick Note:  MRI does show some changes and spinal stenosis not at the levels we were expecting. Return to office for further discussion. ______

## 2015-10-11 ENCOUNTER — Ambulatory Visit: Payer: BC Managed Care – PPO | Admitting: Physical Therapy

## 2015-10-12 ENCOUNTER — Encounter: Payer: Self-pay | Admitting: Family Medicine

## 2015-10-12 ENCOUNTER — Ambulatory Visit (INDEPENDENT_AMBULATORY_CARE_PROVIDER_SITE_OTHER): Payer: BC Managed Care – PPO | Admitting: Family Medicine

## 2015-10-12 VITALS — BP 105/90 | HR 61 | Wt 227.0 lb

## 2015-10-12 DIAGNOSIS — R202 Paresthesia of skin: Secondary | ICD-10-CM

## 2015-10-12 DIAGNOSIS — M79609 Pain in unspecified limb: Secondary | ICD-10-CM

## 2015-10-12 MED ORDER — GABAPENTIN 100 MG PO CAPS: 100.0000 mg | ORAL_CAPSULE | Freq: Three times a day (TID) | ORAL | Status: DC

## 2015-10-12 NOTE — Assessment & Plan Note (Signed)
Unclear etiology possible due to entrapment of the lateral femoral cutaneous nerve or possibly the femoral nerve. It seems to be likely the L2 or L3 nerve root however MRI at that area is not significantly abnormal.  Discussed options with patient. Plan to start gabapentin and obtain a nerve conduction study test.

## 2015-10-12 NOTE — Patient Instructions (Signed)
Thank you for coming in today. We will contact you about the nerve conduction test.  Take gabapentin. Take 1 pill at night. After a few days advance to 1 pill at night and at lunch. Advance to 1 pill 3x daily.

## 2015-10-12 NOTE — Progress Notes (Signed)
       Stephanie [name] is a 59 y.o. female who presents to [place] [facility]: Primary Care today for follow-up paresthesia. Patient has burning tingling pain to her right anterior leg. This is been present for a few months. Has been worked up partially recently. She had a lumbar MRI that was not very revealing. She's here to discuss MRI results and options. She denies any weakness or bladder dysfunction or difficulty walking. She feels well otherwise. Symptoms are moderate.   Past Medical History  Diagnosis Date  . Plantar fasciitis   . Nephrolithiasis   . Diverticulitis 01/21/13   Past Surgical History  Procedure Laterality Date  . Cholecystectomy  1982  . Tonsilectomy, adenoidectomy, bilateral myringotomy and tubes      at age 30 approx.   . Tubal ligation  1993   Social History  Substance Use Topics  . Smoking status: Former Smoker -- 0.20 packs/day for 2 years    Types: Cigarettes    Quit date: 10/14/1976  . Smokeless tobacco: Never Used  . Alcohol Use: No   family history includes Alcohol abuse in her father; COPD in her father; Cancer in her father and mother; Coronary artery disease in her other; Hyperlipidemia in her father and mother; Prostate cancer in her father. There is no history of Colon cancer.  ROS as above Medications: Current Outpatient Prescriptions  Medication Sig Dispense Refill  . meloxicam (MOBIC) 15 MG tablet Take 1 tablet (15 mg total) by mouth daily. Take with food each morning 30 tablet 0  . nitroGLYCERIN (MINITRAN) 0.2 mg/hr patch Apply 1/4 patch for 12 hours each day 30 patch 12  . omeprazole (PRILOSEC) 40 MG capsule Take 1 capsule (40 mg total) by mouth daily. 30 capsule 3  . gabapentin (NEURONTIN) 100 MG capsule Take 1 capsule (100 mg total) by mouth 3 (three) times daily. 90 capsule 0   No current facility-administered medications for this visit.   No Known  Allergies   Exam:  BP 105/90 mmHg  Pulse 61  Wt 227 lb (102.967 kg) Gen: Well NAD Hip is nontender bilaterally with normal range of motion. No reproducible symptoms with pressure over the lateral femoral cutaneous nerve area.  MRI lumbar spine reviewed with patient  No results found for this or any previous visit (from the past 24 hour(s)). No results found.   Please see individual assessment and plan sections.

## 2015-10-17 ENCOUNTER — Encounter: Payer: Self-pay | Admitting: Family Medicine

## 2015-10-18 ENCOUNTER — Ambulatory Visit (INDEPENDENT_AMBULATORY_CARE_PROVIDER_SITE_OTHER): Payer: BC Managed Care – PPO | Admitting: Physical Therapy

## 2015-10-18 DIAGNOSIS — R52 Pain, unspecified: Secondary | ICD-10-CM

## 2015-10-18 DIAGNOSIS — M436 Torticollis: Secondary | ICD-10-CM

## 2015-10-18 DIAGNOSIS — M25672 Stiffness of left ankle, not elsewhere classified: Secondary | ICD-10-CM | POA: Diagnosis not present

## 2015-10-18 DIAGNOSIS — R531 Weakness: Secondary | ICD-10-CM

## 2015-10-18 NOTE — Therapy (Signed)
[place] [facility] [place], [place], [address] Phone: [phone]   Fax:  [phone]  Physical Therapy Evaluation  Patient Details  Name: Stephanie [name] MRN: [id number] Date of Birth: 1956/08/29 Referring Provider: Dr Lynne Leader  Encounter Date: 10/18/2015      PT End of Session - 10/18/15 1017    Visit Number 1   Number of Visits 12   Date for PT Re-Evaluation 11/29/15   PT Start Time 1017   PT Stop Time 1125   PT Time Calculation (min) 68 min   Activity Tolerance Patient limited by pain      Past Medical History  Diagnosis Date  . Plantar fasciitis   . Nephrolithiasis   . Diverticulitis 01/21/13    Past Surgical History  Procedure Laterality Date  . Cholecystectomy  1982  . Tonsilectomy, adenoidectomy, bilateral myringotomy and tubes      at age 56 approx.   . Tubal ligation  1993    There were no vitals filed for this visit.  Visit Diagnosis:  Pain of multiple sites - Plan: PT plan of care cert/re-cert  Stiffness of ankle joint, left - Plan: PT plan of care cert/re-cert  Stiffness of neck - Plan: PT plan of care cert/re-cert  Generalized weakness - Plan: PT plan of care cert/re-cert      Subjective Assessment - 10/18/15 1018    Subjective Pt states that she developed Lt achilles pain on 08/21/15 after running to catch her granddaughter.  issued melexicam and nirto patch.  She was seen in urgent care and referred to specialist.  Patient mentioned that she began having low back and Rt LE pain in the anterior thigh. the neck pian is more intermittent based on activity, she was on sewing maching last night and it hurts really bad now.    Pertinent History exercise for ankle heel raises off step for the ankle   How long can you sit comfortably? limted 15' and gets figity   How long can you stand comfortably? incrased Rt thigh pain   How long can you walk comfortably? hurts achilles   Diagnostic tests  x-ray of spine, cervical DDD, lumbar stenosis, MRI mild bulges, going to be scheduled for nerve conduction test for the Rt thigh.    Patient Stated Goals not sure as she has so much going on   Currently in Pain? Yes   Pain Score 4    Pain Location Neck   Pain Orientation --  center base of head to shoulders   Pain Descriptors / Indicators Tightness   Pain Type Chronic pain   Pain Onset More than a month ago   Pain Frequency Intermittent   Aggravating Factors  over use, looking down   Pain Relieving Factors medication   Multiple Pain Sites Yes   Pain Score 0   Pain Location --  lumbar and LE's   Pain Frequency Intermittent   Aggravating Factors  sweeping, multitasks activities   Pain Relieving Factors medication   Pain Score 3   Pain Location Ankle   Pain Orientation Left   Pain Descriptors / Indicators Aching   Pain Type Acute pain   Pain Onset More than a month ago   Pain Frequency Intermittent   Aggravating Factors  walking, having to take one step at a time   Pain Relieving Factors medication some            [facility] PT Assessment - 10/18/15 0001  Assessment   Medical Diagnosis lumbar radiculopathy, cervical pain, Lt achilles tendonitis   Referring Provider Dr Lynne Leader   Onset Date/Surgical Date 08/21/15   Hand Dominance Right   Next MD Visit scheduling after nerve conduction   Prior Therapy no   Precautions   Precautions None   Balance Screen   Has the patient fallen in the past 6 months No   Has the patient had a decrease in activity level because of a fear of falling?  No   Is the patient reluctant to leave their home because of a fear of falling?  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    Posture/Postural Control   Posture/Postural Control Postural limitations   Postural Limitations Rounded Shoulders;Increased lumbar lordosis   ROM / Strength   AROM / PROM / Strength AROM;PROM;Strength   AROM   AROM Assessment Site Cervical;Shoulder;Lumbar;Ankle   Right/Left Shoulder --  bilat WNL   Right/Left Ankle Right;Left   Right Ankle Dorsiflexion 2   Left Ankle Dorsiflexion 5   Cervical Flexion WNL   Cervical Extension WNL with pain   Cervical - Right Rotation 65   Cervical - Left Rotation 55   Lumbar Flexion WNL   Lumbar Extension WNL   Lumbar - Right Side Bend WNL   Lumbar - Left Side Bend WNL   Lumbar - Right Rotation WNL   Lumbar - Left Rotation WNL   PROM   PROM Assessment Site Ankle   Right/Left Ankle Right;Left   Right Ankle Dorsiflexion 5   Left Ankle Dorsiflexion 12   Strength   Overall Strength --  UE's WNL, however pain with resisted shoulder flexion   Overall Strength Comments LE's WNL except Lt hip abduction 4+/5   Palpation   Spinal mobility hypomobile C6-T 4, pain CPA and Lt UPA, slight tenderness Rt UPA at C6-T1, unable to assess lumbar spine due to hypersensitivity to touch    Palpation comment tightness in Lt post tib and gastroc laterally. large trigger point in Lt upper trap/levator, hypersenstivie to touch in bilat lumbar area, upper gluts and Rt piriformis.                    [facility] Adult PT Treatment/Exercise - 10/18/15 0001    Exercises   Exercises Lumbar;Ankle   Lumbar Exercises: Stretches   Single Knee to Chest Stretch 2 reps;30 seconds  each leg    Lower Trunk Rotation 4 reps;30 seconds   Lumbar Exercises: Supine   Other Supine Lumbar Exercises Decompression series  including 10 reps each of the head press, shoulder press, leg lengthener, and leg press.    Ankle Exercises: Stretches   Soleus Stretch 30 seconds;3 reps  each leg   Gastroc Stretch 30 seconds;3 reps  each leg   Ankle Exercises: Supine   T-Band Resisted DF with red band x 10 reps each foot.  PT Education - 10/18/15 1159    Education provided Yes   Education Details HEP   Person(s) Educated Patient   Methods Handout;Demonstration;Explanation;Verbal cues   Comprehension Verbalized understanding;Returned demonstration          PT Short Term Goals - 10/18/15 1318    PT SHORT TERM GOAL #1   Title I with initial HEP ( 11/08/15)    Time 3   Period Weeks   Status New   PT SHORT TERM GOAL #2   Title increase bilat dorsiflexion =/> 15 degreees. ( 11/08/15)    Time 3   Period Weeks   Status New   PT SHORT TERM GOAL #3   Title increase Lt hip abduction =/< 5-/5 ( 11/08/15)    Time 3   Period Weeks   Status New   PT SHORT TERM GOAL #4   Title demo decreased low back pain to allow palpation and spinal mobs. ( 11/08/15)    Time 3   Period Weeks   Status New           PT Long Term Goals - 10/18/15 1323    PT LONG TERM GOAL #1   Title I with advanced HEP ( 11/29/15)    Time 6   Period Weeks   Status New   PT LONG TERM GOAL #2   Title increase cervical rotation =/> 65 degrees to assist with driving ( [id number])    Time 6   Period Weeks   Status New   PT LONG TERM GOAL #3   Title report Lt achilles pain =/> 80% improved to allow her to ambulate alternating on stairs (11/29/15)    Time 6   Period Weeks   Status New   PT LONG TERM GOAL #4   Title report overall back/neck pain reduced =/> 50% with daily activity ( 11/29/15)    Time 6   Period Weeks   Status New   PT LONG TERM GOAL #5   Title improve FOTO =/< 26% limited. ( 11/29/15)    Time 6   Period Weeks   Status New               Plan - 10/18/15 1305    Clinical Impression Statement 60  yo female presents with multiple areas of pain.  Neck and low back more chronic, Lt achilles acute.  She has stiffness in bilat ankles, h/o plantarfascitis, some weaknessand edema, she is hypersensitive to palpation in her low back and hypomobile through the C and upper T spine along with a trigger point. Eitology of low back is unclear, as her pain reduces this will be continually assessed   Pt will benefit from skilled therapeutic intervention in order to improve on the following deficits Postural dysfunction;Decreased strength;Hypomobility;Pain;Increased edema;Increased muscle spasms;Decreased range of motion   Rehab Potential Good   PT Frequency 2x / week   PT Duration 6 weeks   PT Treatment/Interventions Traction;Ultrasound;Neuromuscular re-education;Patient/family education;Passive range of motion;Dry needling;Stair training;Cryotherapy;Electrical Stimulation;Iontophoresis 4mg /ml Dexamethasone;Moist Heat;Therapeutic exercise;Manual techniques;Vasopneumatic Device;Taping   PT Next Visit Plan assess and progress tolerance to HEP, spinal stab ex,  US/ionto to Lt achilles   Consulted and Agree with Plan of Care Patient         Problem List Patient Active Problem List   Diagnosis Date Noted  . Cervical pain (neck) 10/02/2015  . Achilles tendinitis of left lower extremity 09/06/2015  . Paresthesia and pain of right extremity 09/06/2015  . Splenic artery aneurysm ([place]) 01/21/2013  .  Diverticulitis of colon without hemorrhage 01/21/2013  . Chest pain 01/11/2011  . COSTOCHONDRITIS 01/01/2011  . FATIGUE 01/01/2011  . DYSPNEA ON EXERTION 01/01/2011  . BENIGN NEOPLASM OF OTHER SPECIFIED SITES 12/18/2009    Jeral Pinch PT 10/18/2015, 1:40 PM  [facility] [place], [place], [address] Phone: [phone]   Fax:  [phone]  Name: Stephanie [name] MRN: [id number] Date of Birth: Apr 05, 1956

## 2015-10-18 NOTE — Patient Instructions (Addendum)
Decompression Exercise: Basic - pillow under head   [place] [phone]   Lie on back on firm surface, knees bent, feet flat, arms turned up, out to sides, backs of hands down. Time _1-10__ minutes. Surface: floor  Shoulder Press   Press both shoulders down. Hold __3_ seconds. Repeat __10_ times. If unable to press one or both shoulders, lie in position a few sessions until you can. Surface: floor   Head Press With [employer]   Keep head in a neutral position, keep mouth closed. Feel weight on back of head. Increase weight by pressing head down. Hold _3__ seconds. Relax. Repeat _10__ times. Surface: floor   Leg Lengthener: Full   Straighten one leg. Pull toes AND forefoot toward knee, extend heel. Lengthen leg by pulling pelvis away from ribs. Hold _3__ seconds. Relax. Repeat 10 times. Re-bend knee. Do other leg. Each leg _10__ times. Surface: floor   Leg Press: Single   Straighten one leg down to floor. Bring toes AND forefoot toward knee, extend heel. Press leg down. DO NOT BEND KNEE. Hold _3__ seconds. Relax leg. Repeat exercise 10 times. Relax leg. Re-bend knee. Repeat with other leg. Each leg _10__ times.   Knee-to-Chest Stretch: Unilateral    With hand behind right knee, pull knee in to chest until a comfortable stretch is felt in lower back and buttocks. Keep back relaxed. Hold __20__ seconds. Repeat __2-3__ times per set. Do ____ sets per session. Do ____ sessions per day.  http://orth.exer.us/126   Copyright  VHI. All rights reserved.  Lumbar Rotation (Non-Weight Bearing)    Feet on floor, slowly rock knees from side to side in small, pain-free range of motion. Allow lower back to rotate slightly. Repeat __10__ times per set. Do ___1_ sets per session. Do _1-2___ sessions per day.  http://orth.exer.us/160   Copyright  VHI. All rights reserved.  Gastroc Stretch    Stand with right foot back, leg straight, forward leg bent. Keeping heel on floor, turned  slightly out, lean into wall until stretch is felt in calf. Hold __20-30__ seconds. Repeat _2-3___ times per set. Do __1__ sets per session. Do __2__ sessions per day.  http://orth.exer.us/26   Copyright  VHI. All rights reserved.  Soleus Stretch    Stand with right foot back, both knees bent. Keeping heel on floor, turned slightly out, lean into wall until stretch is felt in lower calf. Hold __15-20__ seconds, 2-3 sets. 1-2 x per day  http://orth.exer.us/24   Dorsiflexion: Resisted    Facing anchor, tubing around left foot, pull toward face.  Repeat _10___ times per set. Do ___2_ sets per session. Do _1___ sessions per day.  http://orth.exer.us/9   [facility] Re[facility] at [facility] [place],  [address]  [phone] (office) [phone] (fax)  Copyright  VHI. All rights reserved.

## 2015-10-20 ENCOUNTER — Ambulatory Visit (INDEPENDENT_AMBULATORY_CARE_PROVIDER_SITE_OTHER): Payer: BC Managed Care – PPO | Admitting: Physical Therapy

## 2015-10-20 DIAGNOSIS — M25672 Stiffness of left ankle, not elsewhere classified: Secondary | ICD-10-CM

## 2015-10-20 DIAGNOSIS — R52 Pain, unspecified: Secondary | ICD-10-CM | POA: Diagnosis not present

## 2015-10-20 DIAGNOSIS — M436 Torticollis: Secondary | ICD-10-CM | POA: Diagnosis not present

## 2015-10-20 NOTE — Patient Instructions (Signed)
Abdominal Bracing With Pelvic Floor (Hook-Lying)    With neutral spine, tighten pelvic floor and abdominals. Hold 5-10 seconds. Count out loud if needed - don't hold breath. Repeat _10__ times. Do _1__ times a day.   [facility] at [facility] [place], [place] [address]  [phone] (office) [phone] (fax)

## 2015-10-20 NOTE — Therapy (Signed)
[place], [place], [address] Phone: [phone]   Fax:  [phone]  Physical Therapy Treatment  Patient Details  Name: Stephanie [name] MRN: [id number] Date of Birth: 27-Apr-1956 Referring Provider: Dr. Georgina Snell   Encounter Date: 10/20/2015      PT End of Session - 10/20/15 1109    Visit Number 2   Number of Visits 12   Date for PT Re-Evaluation 11/29/15   PT Start Time 1107  pt arrived late   PT Stop Time 1219   PT Time Calculation (min) 72 min   Activity Tolerance Patient tolerated treatment well      Past Medical History  Diagnosis Date  . Plantar fasciitis   . Nephrolithiasis   . Diverticulitis 01/21/13    Past Surgical History  Procedure Laterality Date  . Cholecystectomy  1982  . Tonsilectomy, adenoidectomy, bilateral myringotomy and tubes      at age 74 approx.   . Tubal ligation  1993    There were no vitals filed for this visit.  Visit Diagnosis:  Pain of multiple sites  Stiffness of ankle joint, left  Stiffness of neck      Subjective Assessment - 10/20/15 1110    Subjective Pt reports she has been completed HEP at home.  Rt thigh was bothering her this morning so she took 2 Gabapentin and now no pain/numbness. Ankle is improving.    Currently in Pain? Yes   Pain Score 3    Pain Location Neck   Pain Orientation Right;Left   Pain Descriptors / Indicators Burning   Aggravating Factors  looking down, arms on steering wheel    Pain Relieving Factors medication             [facility] PT Assessment - 10/20/15 0001    Assessment   Medical Diagnosis lumbar radiculopathy, cervical pain, Lt achilles tendonitis   Referring Provider Dr. Georgina Snell    Onset Date/Surgical Date 08/21/15   Hand Dominance Right   Next MD Visit scheduling after nerve conduction   Prior Therapy no          [facility] Adult PT Treatment/Exercise - 10/20/15 0001    Exercises   Exercises Neck;Ankle;Lumbar    Neck Exercises: Standing   Other Standing Exercises scap retraction around pool noodle 5 sec hold x 15 reps    Other Standing Exercises scap retraction with bilateral shoulder ER with yellow band x 15   Neck Exercises: Seated   Other Seated Exercise elbows on knees - cervical diagonals 4 reps each side, flexion to neutral x 4 reps    Neck Exercises: Supine   Cervical Isometrics 5 secs  head press    Neck Retraction 10 reps;3 secs  chin tucks   Neck Retraction Limitations tactile cues for improved form.    Shoulder Flexion Both;10 reps  bilateral overhead pull yellow, then greenx 10   Other Supine Exercise sash with red band x 10 reps each side. Switched to green each side x 10 reps   Lumbar Exercises: Stretches   Lower Trunk Rotation, single knee to chest 5 reps;10 seconds;  1 rep of SKTC   Lumbar Exercises: Aerobic   Stationary Bike NuStep L4: legs only 3 min then arms and leg 4 min.    Lumbar Exercises: Supine   Ab Set 10 reps;5 seconds   Other Supine Lumbar Exercises abdominal bracing with transitional movements - sit to/from stand and sit to/from supine x 2 each.  Modalities   Modalities Electrical Stimulation;Moist Heat   Moist Heat Therapy   Number Minutes Moist Heat 15 Minutes   Moist Heat Location Lumbar Spine;Cervical   Electrical Stimulation   Electrical Stimulation Location cervical paraspinals    Electrical Stimulation Action IFC   Electrical Stimulation Parameters to tolerance, 15 min    Electrical Stimulation Goals Pain   Neck Exercises: Stretches   Upper Trapezius Stretch 3 reps;20 seconds   Ankle Exercises: Stretches   Soleus Stretch 30 seconds;3 reps  each leg   Gastroc Stretch 30 seconds;3 reps  each leg      Doorway stretch at low, mid-level and high level x 20 sec x 2 reps each position.         PT Short Term Goals - 10/18/15 1318    PT SHORT TERM GOAL #1   Title I with initial HEP ( 11/08/15)    Time 3   Period Weeks   Status New   PT  SHORT TERM GOAL #2   Title increase bilat dorsiflexion =/> 15 degreees. ( 11/08/15)    Time 3   Period Weeks   Status New   PT SHORT TERM GOAL #3   Title increase Lt hip abduction =/< 5-/5 ( 11/08/15)    Time 3   Period Weeks   Status New   PT SHORT TERM GOAL #4   Title demo decreased low back pain to allow palpation and spinal mobs. ( 11/08/15)    Time 3   Period Weeks   Status New           PT Long Term Goals - 10/18/15 1323    PT LONG TERM GOAL #1   Title I with advanced HEP ( 11/29/15)    Time 6   Period Weeks   Status New   PT LONG TERM GOAL #2   Title increase cervical rotation =/> 65 degrees to assist with driving ( [id number])    Time 6   Period Weeks   Status New   PT LONG TERM GOAL #3   Title report Lt achilles pain =/> 80% improved to allow her to ambulate alternating on stairs (11/29/15)    Time 6   Period Weeks   Status New   PT LONG TERM GOAL #4   Title report overall back/neck pain reduced =/> 50% with daily activity ( 11/29/15)    Time 6   Period Weeks   Status New   PT LONG TERM GOAL #5   Title improve FOTO =/< 26% limited. ( 11/29/15)    Time 6   Period Weeks   Status New               Plan - 10/20/15 1217    Clinical Impression Statement Pt tolerated new exercises well with minimal increase in pain. Time was focused on neck and core this session.  Pt required multiple cues to engage transverse abdominals.  Pt reported reduction of neck symptoms with use of MHP and estim at end of session.    Pt will benefit from skilled therapeutic intervention in order to improve on the following deficits Postural dysfunction;Decreased strength;Hypomobility;Pain;Increased edema;Increased muscle spasms;Decreased range of motion   Rehab Potential Good   PT Frequency 2x / week   PT Duration 6 weeks   PT Treatment/Interventions Traction;Ultrasound;Neuromuscular re-education;Patient/family education;Passive range of motion;Dry needling;Stair  training;Cryotherapy;Electrical Stimulation;Iontophoresis 4mg /ml Dexamethasone;Moist Heat;Therapeutic exercise;Manual techniques;Vasopneumatic Device;Taping   PT Next Visit Plan Continue spinal stabilization and postural strengthening, possible trial of ionto/USto  Lt Achilles.         Problem List Patient Active Problem List   Diagnosis Date Noted  . Cervical pain (neck) 10/02/2015  . Achilles tendinitis of left lower extremity 09/06/2015  . Paresthesia and pain of right extremity 09/06/2015  . Splenic artery aneurysm ([place]) 01/21/2013  . Diverticulitis of colon without hemorrhage 01/21/2013  . Chest pain 01/11/2011  . COSTOCHONDRITIS 01/01/2011  . FATIGUE 01/01/2011  . DYSPNEA ON EXERTION 01/01/2011  . BENIGN NEOPLASM OF OTHER SPECIFIED SITES 12/18/2009    Kerin Perna, PTA 10/20/2015 12:39 PM  [place], [place], [address] Phone: [phone]   Fax:  [phone]  Name: Stephanie [name] MRN: [id number] Date of Birth: Sep 23, 1956

## 2015-10-25 ENCOUNTER — Ambulatory Visit (INDEPENDENT_AMBULATORY_CARE_PROVIDER_SITE_OTHER): Payer: BC Managed Care – PPO | Admitting: Physical Therapy

## 2015-10-25 DIAGNOSIS — M25672 Stiffness of left ankle, not elsewhere classified: Secondary | ICD-10-CM

## 2015-10-25 DIAGNOSIS — M436 Torticollis: Secondary | ICD-10-CM | POA: Diagnosis not present

## 2015-10-25 DIAGNOSIS — R531 Weakness: Secondary | ICD-10-CM

## 2015-10-25 DIAGNOSIS — R52 Pain, unspecified: Secondary | ICD-10-CM | POA: Diagnosis not present

## 2015-10-25 NOTE — Therapy (Signed)
[place], [place], [address] Phone: [phone]   Fax:  [phone]  Physical Therapy Treatment  Patient Details  Name: Stephanie [name] MRN: [id number] Date of Birth: 1955/11/12 Referring Provider: Dr. Georgina Snell   Encounter Date: 10/25/2015      PT End of Session - 10/25/15 0942    Visit Number 3   Number of Visits 12   Date for PT Re-Evaluation 11/29/15   PT Start Time [id number]  pt arrived late   PT Stop Time 1015   PT Time Calculation (min) 36 min   Activity Tolerance Patient tolerated treatment well;No increased pain      Past Medical History  Diagnosis Date  . Plantar fasciitis   . Nephrolithiasis   . Diverticulitis 01/21/13    Past Surgical History  Procedure Laterality Date  . Cholecystectomy  1982  . Tonsilectomy, adenoidectomy, bilateral myringotomy and tubes      at age 66 approx.   . Tubal ligation  1993    There were no vitals filed for this visit.  Visit Diagnosis:  Pain of multiple sites  Stiffness of ankle joint, left  Stiffness of neck  Generalized weakness      Subjective Assessment - 10/25/15 0943    Subjective Pt reports she is feeling pretty today.   She has been performing exercises daily.  Dealing with family stress lately.    Currently in Pain? No/denies            [facility] PT Assessment - 10/25/15 0001    Assessment   Medical Diagnosis lumbar radiculopathy, cervical pain, Lt achilles tendonitis   Referring Provider Dr. Georgina Snell    Onset Date/Surgical Date 08/21/15   Hand Dominance Right   Next MD Visit scheduling after nerve conduction   Prior Therapy no          [facility] Adult PT Treatment/Exercise - 10/25/15 0001    Neck Exercises: Standing   Other Standing Exercises Standing shoulder diagonals with yellow x 10 each side. Repeated with red band x 10 each arm    Other Standing Exercises bilateral shoulder ER with red band x 15 reps; bilateral horizontal  abduction with red band x 10 each.    Lumbar Exercises: Stretches   Passive Hamstring Stretch 2 reps;30 seconds  RLE   Lumbar Exercises: Aerobic   Stationary Bike NuStep L4: 5.5 min    Neck Exercises: Stretches   Upper Trapezius Stretch 3 reps;20 seconds   Other Neck Stretches Doorway stretch 3 positions 30 sec each    Ankle Exercises: Stretches   Soleus Stretch 30 seconds;3 reps  each leg   Gastroc Stretch 30 seconds;3 reps  each leg   Ankle Exercises: Standing   SLS Rt/Lt SLS with horizontal head turns with occasional UE support - 2 trials each side.  Very challenging on LLE.    Heel Raises 10 reps;5 reps  toes out, toes in. Rt hamstring cramped, stopped to stretch                   PT Short Term Goals - 10/25/15 1115    PT SHORT TERM GOAL #1   Title I with initial HEP ( 11/08/15)    Time 3   Period Weeks   Status On-going   PT SHORT TERM GOAL #2   Title increase bilat dorsiflexion =/> 15 degreees. ( 11/08/15)    Time 3   Period Weeks   Status On-going   PT  SHORT TERM GOAL #3   Title increase Lt hip abduction =/< 5-/5 ( 11/08/15)    Time 3   Period Weeks   Status On-going   PT SHORT TERM GOAL #4   Title demo decreased low back pain to allow palpation and spinal mobs. ( 11/08/15)    Time 3   Period Weeks   Status On-going           PT Long Term Goals - 10/25/15 1112    PT LONG TERM GOAL #1   Title I with advanced HEP ( 11/29/15)    Time 6   Period Weeks   Status On-going   PT LONG TERM GOAL #2   Title increase cervical rotation =/> 65 degrees to assist with driving ( [id number])    Time 6   Period Weeks   Status On-going   PT LONG TERM GOAL #3   Title report Lt achilles pain =/> 80% improved to allow her to ambulate alternating on stairs (11/29/15)    Time 6   Period Weeks   Status On-going   PT LONG TERM GOAL #4   Title report overall back/neck pain reduced =/> 50% with daily activity ( 11/29/15)    Time 6   Period Weeks   Status On-going   PT  LONG TERM GOAL #5   Title improve FOTO =/< 26% limited. ( 11/29/15)    Time 6   Period Weeks   Status On-going               Plan - 10/25/15 1112    Clinical Impression Statement Pt reporting less pain in all areas overall.  Pt reported some mild increase in Lt achilles with heel raises, but subsided with rest.  Pt tolerated all other exercises well, no increase in symptoms.  Pt declined use of modalities as she felt good at end of session.  Progressing towards goals.    Pt will benefit from skilled therapeutic intervention in order to improve on the following deficits Postural dysfunction;Decreased strength;Hypomobility;Pain;Increased edema;Increased muscle spasms;Decreased range of motion   Rehab Potential Good   PT Frequency 2x / week   PT Duration 6 weeks   PT Treatment/Interventions Traction;Ultrasound;Neuromuscular re-education;Patient/family education;Passive range of motion;Dry needling;Stair training;Cryotherapy;Electrical Stimulation;Iontophoresis 4mg /ml Dexamethasone;Moist Heat;Therapeutic exercise;Manual techniques;Vasopneumatic Device;Taping   PT Next Visit Plan Continue spinal stabilization and postural strengthening; assess cervical ROM and ankle DF.  Add hip abd strengthening.    Consulted and Agree with Plan of Care Patient        Problem List Patient Active Problem List   Diagnosis Date Noted  . Cervical pain (neck) 10/02/2015  . Achilles tendinitis of left lower extremity 09/06/2015  . Paresthesia and pain of right extremity 09/06/2015  . Splenic artery aneurysm ([place]) 01/21/2013  . Diverticulitis of colon without hemorrhage 01/21/2013  . Chest pain 01/11/2011  . COSTOCHONDRITIS 01/01/2011  . FATIGUE 01/01/2011  . DYSPNEA ON EXERTION 01/01/2011  . BENIGN NEOPLASM OF OTHER SPECIFIED SITES 12/18/2009    Kerin Perna, PTA 10/25/2015 11:24 AM  [place], [place],  [address] Phone: [phone]   Fax:  [phone]  Name: Stephanie [name] MRN: [id number] Date of Birth: Dec 11, 1955

## 2015-10-25 NOTE — Patient Instructions (Addendum)
(  Home) PNF: D2 Flexion - Unilateral    Opposite side toward anchor, right arm down, across body, thumb down, pull arm up and out, rotating to thumb up. Follow hand with head and eyes. Repeat __10__ times per set. Do __1-2__ sets per session. Do __3__ sessions per week.  Resisted External Rotation: in Neutral - Bilateral   PALMS UP Sit or stand, tubing in both hands, elbows at sides, bent to 90, forearms forward. Pinch shoulder blades together and rotate forearms out. Keep elbows at sides. Repeat __10__ times per set. Do _2-3___ sets per session. Do _2-3___ sessions per day.    Resisted Horizontal Abduction: Bilateral    Sit or stand, tubing in both hands, arms out in front. Keeping arms straight, pinch shoulder blades together and stretch arms out. Repeat _10___ times per set. Do _1-2___ sets per session. Do _3___ sessions per week.    [facility] [place], [place] [address]  [phone] (office) [phone] (fax)

## 2015-10-27 ENCOUNTER — Ambulatory Visit (INDEPENDENT_AMBULATORY_CARE_PROVIDER_SITE_OTHER): Payer: BC Managed Care – PPO | Admitting: Physical Therapy

## 2015-10-27 DIAGNOSIS — M436 Torticollis: Secondary | ICD-10-CM | POA: Diagnosis not present

## 2015-10-27 DIAGNOSIS — R52 Pain, unspecified: Secondary | ICD-10-CM

## 2015-10-27 DIAGNOSIS — R531 Weakness: Secondary | ICD-10-CM | POA: Diagnosis not present

## 2015-10-27 DIAGNOSIS — M25672 Stiffness of left ankle, not elsewhere classified: Secondary | ICD-10-CM

## 2015-10-27 NOTE — Therapy (Signed)
[place], [place], [address] Phone: [phone]   Fax:  [phone]  Physical Therapy Treatment  Patient Details  Name: Stephanie [name] MRN: [id number] Date of Birth: 04-30-1956 Referring Provider: Dr. Georgina Snell   Encounter Date: 10/27/2015      PT End of Session - 10/27/15 1108    Visit Number 4   Number of Visits 12   Date for PT Re-Evaluation 11/29/15   PT Start Time 1104   PT Stop Time 1157   PT Time Calculation (min) 53 min   Activity Tolerance Patient tolerated treatment well;No increased pain      Past Medical History  Diagnosis Date  . Plantar fasciitis   . Nephrolithiasis   . Diverticulitis 01/21/13    Past Surgical History  Procedure Laterality Date  . Cholecystectomy  1982  . Tonsilectomy, adenoidectomy, bilateral myringotomy and tubes      at age 49 approx.   . Tubal ligation  1993    There were no vitals filed for this visit.  Visit Diagnosis:  Pain of multiple sites  Stiffness of ankle joint, left  Stiffness of neck  Generalized weakness      Subjective Assessment - 10/27/15 1111    Subjective Pt reports she woke with some soreness in LB; feels it was the sweeping and picking up dust pan yesterday.  Lt heel is just a little tender, not back.  Neck feels stiff, but no pain.    Currently in Pain? Yes   Pain Score 2    Pain Location Back   Pain Orientation Right;Left   Pain Descriptors / Indicators Sore   Aggravating Factors  bending over    Pain Relieving Factors medicine, heat             [facility] PT Assessment - 10/27/15 0001    Assessment   Medical Diagnosis lumbar radiculopathy, cervical pain, Lt achilles tendonitis   Referring Provider Dr. Georgina Snell    Onset Date/Surgical Date 08/21/15   Hand Dominance Right   Next MD Visit scheduling after nerve conduction   Prior Therapy no   AROM   AROM Assessment Site Cervical;Ankle   Right/Left Ankle Right;Left    Right Ankle Dorsiflexion 7   Left Ankle Dorsiflexion 9   Cervical - Right Rotation 65   Cervical - Left Rotation 67          [facility] Adult PT Treatment/Exercise - 10/27/15 0001    Neck Exercises: Standing   Other Standing Exercises Standing diagonals with red band x 10 reps x 2 sets each arm; rowing x 15 reps with green band;    Other Standing Exercises bilateral shoulder ER with green band x 10 x 2 sets   Neck Exercises: Prone   Other Prone Exercise POE with cervical flexion to neutral x 5 reps; cervical diagonals x 5 reps each side   Lumbar Exercises: Stretches   Passive Hamstring Stretch 30 seconds;3 reps  seated x 2 reps, then supine with strap each leg   Lower Trunk Rotation 5 reps;10 seconds   Quad Stretch 3 reps;30 seconds  prone with strap   ITB Stretch 2 reps;30 seconds  supine with strap   Piriformis Stretch 2 reps;30 seconds   Lumbar Exercises: Aerobic   Stationary Bike NuStep L5: 5.5 min    Lumbar Exercises: Standing   Other Standing Lumbar Exercises sit to/from stand at low black mat table x 8 reps - increased knee pain bilat, stopped.  Neck Exercises: Stretches   Upper Trapezius Stretch 3 reps;20 seconds   Other Neck Stretches Doorway stretch 3 positions 30 sec each    Ankle Exercises: Stretches   Soleus Stretch 30 seconds;3 reps  each leg   Gastroc Stretch 30 seconds;3 reps  each leg           PT Short Term Goals - 10/25/15 1115    PT SHORT TERM GOAL #1   Title I with initial HEP ( 11/08/15)    Time 3   Period Weeks   Status On-going   PT SHORT TERM GOAL #2   Title increase bilat dorsiflexion =/> 15 degreees. ( 11/08/15)    Time 3   Period Weeks   Status On-going   PT SHORT TERM GOAL #3   Title increase Lt hip abduction =/< 5-/5 ( 11/08/15)    Time 3   Period Weeks   Status On-going   PT SHORT TERM GOAL #4   Title demo decreased low back pain to allow palpation and spinal mobs. ( 11/08/15)    Time 3   Period Weeks   Status On-going            PT Long Term Goals - 10/27/15 1155    PT LONG TERM GOAL #1   Title I with advanced HEP ( 11/29/15)    Time 6   Period Weeks   Status On-going   PT LONG TERM GOAL #2   Title increase cervical rotation =/> 65 degrees to assist with driving ( 5/46/50)    Period Weeks   Status Achieved   PT LONG TERM GOAL #3   Title report Lt achilles pain =/> 80% improved to allow her to ambulate alternating on stairs (11/29/15)    Time 6   Period Weeks   Status On-going   PT LONG TERM GOAL #4   Title report overall back/neck pain reduced =/> 50% with daily activity ( 11/29/15)    Time 6   Period Weeks   Status On-going   PT LONG TERM GOAL #5   Title improve FOTO =/< 26% limited. ( 11/29/15)    Time 6   Period Weeks   Status On-going               Plan - 10/27/15 1204    Clinical Impression Statement Pt demonstrated improved cervical and ankle ROM; has met LTG #2.  Pt tolerated increased resistance without any increase in pain.  Pt did report increased bilat knee pain with sit to stand/ squats; stopped activity and pain subsided.  Encouraged pt to bend at knees/hips with picking up items off floor at home to decrease back pain. Pt declined modalities, will use heat at home if needed later. Making good gains towards remaining goals.    Pt will benefit from skilled therapeutic intervention in order to improve on the following deficits Postural dysfunction;Decreased strength;Hypomobility;Pain;Increased edema;Increased muscle spasms;Decreased range of motion   Rehab Potential Good   PT Frequency 2x / week   PT Duration 6 weeks   PT Treatment/Interventions Traction;Ultrasound;Neuromuscular re-education;Patient/family education;Passive range of motion;Dry needling;Stair training;Cryotherapy;Electrical Stimulation;Iontophoresis '4mg'$ /ml Dexamethasone;Moist Heat;Therapeutic exercise;Manual techniques;Vasopneumatic Device;Taping   PT Next Visit Plan Continue spinal stabilization and postural  strengthening;  Assess hip abd strength and add strengthening.    Consulted and Agree with Plan of Care Patient        Problem List Patient Active Problem List   Diagnosis Date Noted  . Cervical pain (neck) 10/02/2015  . Achilles tendinitis  of left lower extremity 09/06/2015  . Paresthesia and pain of right extremity 09/06/2015  . Splenic artery aneurysm ([place]) 01/21/2013  . Diverticulitis of colon without hemorrhage 01/21/2013  . Chest pain 01/11/2011  . COSTOCHONDRITIS 01/01/2011  . FATIGUE 01/01/2011  . DYSPNEA ON EXERTION 01/01/2011  . BENIGN NEOPLASM OF OTHER SPECIFIED SITES 12/18/2009    Kerin Perna, PTA 10/27/2015 12:37 PM  [place], [place], [address] Phone: [phone]   Fax:  [phone]  Name: Stephanie [name] MRN: [id number] Date of Birth: December 18, 1955

## 2015-10-27 NOTE — Patient Instructions (Signed)
Hamstring Step 2    Left foot relaxed, knee straight, other leg bent, foot flat. Raise straight leg further upward to maximal range. Hold __30_ seconds. Relax leg completely down. Repeat __3_ times.  Copyright  VHI. All rights reserved.   Outer Hip Stretch: Reclined IT Band Stretch (Strap)   Strap around one foot, pull leg across body until you feel a pull or stretch, with shoulders on mat. Hold for 30 seconds. Repeat 3 times each leg. 2-3 times/day. Repeat 3 times. Do 2-3 sessions per day.  KNEE: Quadriceps - Prone    Place strap around ankle. Bring ankle toward buttocks. Press hip into surface. Hold _20__ seconds. __3_ reps per set, _1-2__ sets per day, __5_ days per week  Low Row: Standing    Face anchor, feet shoulder width apart. Palms up, pull arms back, squeezing shoulder blades together. Repeat _10_ times per set. Do _2_ sets per session. Do _3_ sessions per week. Anchor Height: Waist   [facility] [place],  [address]  [phone] (office) [phone] (fax)

## 2015-11-01 ENCOUNTER — Encounter: Payer: Self-pay | Admitting: Physical Therapy

## 2015-11-03 ENCOUNTER — Ambulatory Visit (INDEPENDENT_AMBULATORY_CARE_PROVIDER_SITE_OTHER): Payer: BC Managed Care – PPO | Admitting: Physical Therapy

## 2015-11-03 DIAGNOSIS — M25672 Stiffness of left ankle, not elsewhere classified: Secondary | ICD-10-CM | POA: Diagnosis not present

## 2015-11-03 DIAGNOSIS — R531 Weakness: Secondary | ICD-10-CM | POA: Diagnosis not present

## 2015-11-03 DIAGNOSIS — R52 Pain, unspecified: Secondary | ICD-10-CM | POA: Diagnosis not present

## 2015-11-03 DIAGNOSIS — M436 Torticollis: Secondary | ICD-10-CM

## 2015-11-03 NOTE — Patient Instructions (Signed)
Sleeping on Back  Place pillow under knees. A pillow with cervical support and a roll around waist are also helpful. Copyright  VHI. All rights reserved.  Sleeping on Side Place pillow between knees. Use cervical support under neck and a roll around waist as needed. Copyright  VHI. All rights reserved.   Sleeping on Stomach   If this is the only desirable sleeping position, place pillow under lower legs, and under stomach or chest as needed.  Posture - Sitting   Sit upright, head facing forward. Try using a roll to support lower back. Keep shoulders relaxed, and avoid rounded back. Keep hips level with knees. Avoid crossing legs for long periods. Stand to Sit / Sit to Stand   To sit: Bend knees to lower self onto front edge of chair, then scoot back on seat. To stand: Reverse sequence by placing one foot forward, and scoot to front of seat. Use rocking motion to stand up.   Work Height and Reach  Ideal work height is no more than 2 to 4 inches below elbow level when standing, and at elbow level when sitting. Reaching should be limited to arm's length, with elbows slightly bent.  Bending  Bend at hips and knees, not back. Keep feet shoulder-width apart.    Posture - Standing   Good posture is important. Avoid slouching and forward head thrust. Maintain curve in low back and align ears over shoul- ders, hips over ankles.  Alternating Positions   Alternate tasks and change positions frequently to reduce fatigue and muscle tension. Take rest breaks. Computer Work   Position work to face forward. Use proper work and seat height. Keep shoulders back and down, wrists straight, and elbows at right angles. Use chair that provides full back support. Add footrest and lumbar roll as needed.  Getting Into / Out of Car  Lower self onto seat, scoot back, then bring in one leg at a time. Reverse sequence to get out.  Dressing  Lie on back to pull socks or slacks over feet, or sit  and bend leg while keeping back straight.    Housework - Sink  Place one foot on ledge of cabinet under sink when standing at sink for prolonged periods.   Pushing / Pulling  Pushing is preferable to pulling. Keep back in proper alignment, and use leg muscles to do the work.  Deep Squat   Squat and lift with both arms held against upper trunk. Tighten stomach muscles without holding breath. Use smooth movements to avoid jerking.  Avoid Twisting   Avoid twisting or bending back. Pivot around using foot movements, and bend at knees if needed when reaching for articles.  Carrying Luggage   Distribute weight evenly on both sides. Use a cart whenever possible. Do not twist trunk. Move body as a unit.   Lifting Principles .Maintain proper posture and head alignment. .Slide object as close as possible before lifting. .Move obstacles out of the way. .Test before lifting; ask for help if too heavy. .Tighten stomach muscles without holding breath. .Use smooth movements; do not jerk. .Use legs to do the work, and pivot with feet. .Distribute the work load symmetrically and close to the center of trunk. .Push instead of pull whenever possible.   Ask For Help   Ask for help and delegate to others when possible. Coordinate your movements when lifting together, and maintain the low back curve.  Log Roll   Lying on back, bend left knee and place left   arm across chest. Roll all in one movement to the right. Reverse to roll to the left. Always move as one unit. Housework - Sweeping  Use long-handled equipment to avoid stooping.   Housework - Wiping  Position yourself as close as possible to reach work surface. Avoid straining your back.  Laundry - Unloading Wash   To unload small items at bottom of washer, lift leg opposite to arm being used to reach.  Vanlue close to area to be raked. Use arm movements to do the work. Keep back straight and avoid  twisting.     Cart  When reaching into cart with one arm, lift opposite leg to keep back straight.   Getting Into / Out of Bed  Lower self to lie down on one side by raising legs and lowering head at the same time. Use arms to assist moving without twisting. Bend both knees to roll onto back if desired. To sit up, start from lying on side, and use same move-ments in reverse. Housework - Vacuuming  Hold the vacuum with arm held at side. Step back and forth to move it, keeping head up. Avoid twisting.   Laundry - IT consultant so that bending and twisting can be avoided.   Laundry - Unloading Dryer  Squat down to reach into clothes dryer or use a reacher.  Gardening - Weeding / Probation officer or Kneel. Knee pads may be helpful.                   Side Lunge    Stand with hands on hips, feet shoulder width apart. Take a large step to side putting most of weight on stepping leg and bend it at the knee. Bend opposite knee and lunge to opposite side. Tighten abdominals.    Repeat __10__ times, each leg.

## 2015-11-03 NOTE — Therapy (Signed)
[place], [place], [address] Phone: [phone]   Fax:  [phone]  Physical Therapy Treatment  Patient Details  Name: Stephanie [name] MRN: [id number] Date of Birth: 1956/06/27 Referring Provider: Dr. Georgina Snell   Encounter Date: 11/03/2015      PT End of Session - 11/03/15 1042    Visit Number 5   Number of Visits 12   Date for PT Re-Evaluation 11/29/15   PT Start Time 1020   PT Stop Time 1121   PT Time Calculation (min) 61 min   Activity Tolerance Patient tolerated treatment well      Past Medical History  Diagnosis Date  . Plantar fasciitis   . Nephrolithiasis   . Diverticulitis 01/21/13    Past Surgical History  Procedure Laterality Date  . Cholecystectomy  1982  . Tonsilectomy, adenoidectomy, bilateral myringotomy and tubes      at age 22 approx.   . Tubal ligation  1993    There were no vitals filed for this visit.  Visit Diagnosis:  Pain of multiple sites  Stiffness of ankle joint, left  Stiffness of neck  Generalized weakness      Subjective Assessment - 11/03/15 1023    Subjective Pt reports she took Meloxicam this morning due to pain in LB and neck; reduced pain from 4 to 2/10.  Twisting motion with sweeping irritated back.  All her pain is a little worse, not sure if it is the weather.    Currently in Pain? Yes   Pain Score 2    Pain Location Neck   Pain Orientation Right;Left   Pain Descriptors / Indicators Burning   Aggravating Factors  reaching, driving    Pain Relieving Factors medicine, heat   Pain Score 2   Pain Location Ankle   Pain Orientation Left   Pain Descriptors / Indicators Sharp;Dull   Aggravating Factors  soleus stretch, ??   Pain Relieving Factors rest, medication            [facility] PT Assessment - 11/03/15 0001    Assessment   Medical Diagnosis lumbar radiculopathy, cervical pain, Lt achilles tendonitis   Referring Provider Dr. Georgina Snell    Onset Date/Surgical Date 08/21/15   Hand Dominance Right   Next MD Visit scheduling after nerve conduction (11/19/15/0   ROM / Strength   AROM / PROM / Strength AROM;Strength   AROM   AROM Assessment Site Ankle   Right/Left Ankle Right;Left   Right Ankle Dorsiflexion 7   Left Ankle Dorsiflexion 12   PROM   Right Ankle Dorsiflexion 10   Strength   Overall Strength Comments Rt hip 5/5, Lt hip abd 5-/5              [facility] Adult PT Treatment/Exercise - 11/03/15 0001    Self-Care   Self-Care Posture   Posture Educated pt on body mechanics and posture with activities such as laundry, sweeping, vacuuming. Handout given.    Lumbar Exercises: Stretches   Passive Hamstring Stretch 2 reps;30 seconds   Single Knee to Chest Stretch 1 rep;20 seconds  each side   Double Knee to Chest Stretch 1 rep   Double Knee to Chest Stretch Limitations increased LBP, stopped    ITB Stretch 2 reps;30 seconds  supine with strap, followed by adductor stretch   Piriformis Stretch 2 reps;30 seconds   Lumbar Exercises: Aerobic   Stationary Bike NuStep L5: 6 min    Lumbar Exercises: Standing  Heel Raises 10 reps  heels off edge of step   Forward Lunge 10 reps  with weight shift and core engaged.    Side Lunge 20 reps  with core engaged    Scapular Retraction Both;20 reps;Theraband  with noodle scap squeeze    Theraband Level (Scapular Retraction) Level 3 (Green)  (external rotation)   Scapular Retraction Limitations VC to slow down   Other Standing Lumbar Exercises resisted side stepping with red band x 12 feet Rt/Lt x 2 sets    Modalities   Modalities Iontophoresis;Moist Heat   Moist Heat Therapy   Number Minutes Moist Heat 15 Minutes   Moist Heat Location Cervical;Lumbar Spine   Iontophoresis   Type of Iontophoresis Dexamethasone   Location Lt achilles at calcaneous   Dose 1.0 cc   Time 6 hr patch    Ankle Exercises: Stretches   Soleus Stretch 30 seconds;3 reps  each leg   Gastroc Stretch 30  seconds;3 reps  each leg   Neck Exercises: Stretches   Upper Trapezius Stretch 3 reps;20 seconds   Other Neck Stretches Doorway stretch 3 positions 30 sec each                 PT Education - 11/03/15 1121    Education provided Yes   Education Details handout on body mechanics,   side lunge to HEP, precaution sheet on iontophoresis   Person(s) Educated Patient   Methods Handout;Explanation   Comprehension Verbalized understanding;Returned demonstration          PT Short Term Goals - 11/03/15 1136    PT SHORT TERM GOAL #1   Title I with initial HEP ( 11/08/15)    Time 3   Period Weeks   Status Achieved   PT SHORT TERM GOAL #2   Title increase bilat dorsiflexion =/> 15 degreees. ( 11/08/15)    Time 3   Period Weeks   Status On-going   PT SHORT TERM GOAL #3   Title increase Lt hip abduction =/< 5-/5 ( 11/08/15)    Time 3   Period Weeks   Status Achieved   PT SHORT TERM GOAL #4   Title demo decreased low back pain to allow palpation and spinal mobs. ( 11/08/15)    Time 3   Period Weeks   Status On-going           PT Long Term Goals - 10/27/15 1155    PT LONG TERM GOAL #1   Title I with advanced HEP ( 11/29/15)    Time 6   Period Weeks   Status On-going   PT LONG TERM GOAL #2   Title increase cervical rotation =/> 65 degrees to assist with driving ( 1/70/01)    Period Weeks   Status Achieved   PT LONG TERM GOAL #3   Title report Lt achilles pain =/> 80% improved to allow her to ambulate alternating on stairs (11/29/15)    Time 6   Period Weeks   Status On-going   PT LONG TERM GOAL #4   Title report overall back/neck pain reduced =/> 50% with daily activity ( 11/29/15)    Time 6   Period Weeks   Status On-going   PT LONG TERM GOAL #5   Title improve FOTO =/< 26% limited. ( 11/29/15)    Time 6   Period Weeks   Status On-going               Plan - 11/03/15 1122  Clinical Impression Statement Pt demonstrated improved ankle ROM (Rt tighter than  Lt).  Pt tolerated all exercises without increase in pain.  Initiated trial of ionto to Lt ankle.  Pt has met STG 1 and 3, making good progress towards remaining goals.    Pt will benefit from skilled therapeutic intervention in order to improve on the following deficits Postural dysfunction;Decreased strength;Hypomobility;Pain;Increased edema;Increased muscle spasms;Decreased range of motion   Rehab Potential Good   PT Frequency 2x / week   PT Duration 6 weeks   PT Treatment/Interventions Traction;Ultrasound;Neuromuscular re-education;Patient/family education;Passive range of motion;Dry needling;Stair training;Cryotherapy;Electrical Stimulation;Iontophoresis 79m/ml Dexamethasone;Moist Heat;Therapeutic exercise;Manual techniques;Vasopneumatic Device;Taping   PT Next Visit Plan Continue spinal stabilization and postural strengthening; assess response to ionto treatment.    Consulted and Agree with Plan of Care Patient        Problem List Patient Active Problem List   Diagnosis Date Noted  . Cervical pain (neck) 10/02/2015  . Achilles tendinitis of left lower extremity 09/06/2015  . Paresthesia and pain of right extremity 09/06/2015  . [place] 01/21[place]2013  . Diverticulitis of colon without hemorrhage 01/21/2013  . Chest pain 01/11/2011  . COSTOCHONDRITIS 01/01/2011  . FATIGUE 01/01/2011  . DYSPNEA ON EXERTION 01/01/2011  . BENIGN NEOPLASM OF OTHER SPECIFIED SITES 12/18/2009    Stephanie [name] PTA 11/03/2015 11:42 AM  [place] [address]Phone: [phone]  Fax:  [phone] Name: Stephanie YOKUMMRN: [id number] of Birth: [birth year]

## 2015-11-10 ENCOUNTER — Ambulatory Visit (INDEPENDENT_AMBULATORY_CARE_PROVIDER_SITE_OTHER): Payer: BC Managed Care – PPO | Admitting: Physical Therapy

## 2015-11-10 DIAGNOSIS — R531 Weakness: Secondary | ICD-10-CM | POA: Diagnosis not present

## 2015-11-10 DIAGNOSIS — M436 Torticollis: Secondary | ICD-10-CM

## 2015-11-10 DIAGNOSIS — M25672 Stiffness of left ankle, not elsewhere classified: Secondary | ICD-10-CM | POA: Diagnosis not present

## 2015-11-10 DIAGNOSIS — R52 Pain, unspecified: Secondary | ICD-10-CM

## 2015-11-10 NOTE — Therapy (Addendum)
[place], [place], [address] Phone: [phone]   Fax:  [phone]  Physical Therapy Treatment  Patient Details  Name: Stephanie [name] MRN: [id number] Date of Birth: 04/13/1956 Referring Provider: Dr. Georgina Snell  Encounter Date: 11/10/2015      PT End of Session - 11/10/15 1022    Visit Number 6   Number of Visits 12   Date for PT Re-Evaluation 11/29/15   PT Start Time 1017   PT Stop Time 1117   PT Time Calculation (min) 60 min      Past Medical History  Diagnosis Date  . Plantar fasciitis   . Nephrolithiasis   . Diverticulitis 01/21/13    Past Surgical History  Procedure Laterality Date  . Cholecystectomy  1982  . Tonsilectomy, adenoidectomy, bilateral myringotomy and tubes      at age 60 approx.   . Tubal ligation  1993    There were no vitals filed for this visit.  Visit Diagnosis:  Pain of multiple sites  Stiffness of ankle joint, left  Stiffness of neck  Generalized weakness      Subjective Assessment - 11/10/15 1023    Subjective Pt had a flare up of her low back last weekend, resolved with rest, heat, stretches.  Neck continues to be nagging.  No skin irritation from ionto patch on ankle.    Currently in Pain? Yes   Pain Score 3    Pain Location Neck   Pain Orientation Right;Left   Pain Descriptors / Indicators Dull   Aggravating Factors  reaching, driving   Pain Relieving Factors medicine, heat             [facility] PT Assessment - 11/10/15 0001    Assessment   Medical Diagnosis lumbar radiculopathy, cervical pain, Lt achilles tendonitis   Referring Provider Dr. Georgina Snell   Onset Date/Surgical Date 08/21/15   Hand Dominance Right   AROM   Right/Left Ankle Right;Left   Right Ankle Dorsiflexion 9   Left Ankle Dorsiflexion 10   PROM   Right Ankle Dorsiflexion 15   Left Ankle Dorsiflexion 15          [facility] Adult PT Treatment/Exercise - 11/10/15 0001    Self-Care    Self-Care Other Self-Care Comments   Other Self-Care Comments  Pt instructed in self-massage with ball (MFR/ rolling); pt returned demo    Neck Exercises: Seated   Other Seated Exercise upper trap stretch x 30 sec x 2, levator stretch x 2 reps x 30 sec.    Neck Exercises: Supine   Other Supine Exercise thoracic extension over black soft bolster at various upper segments   Neck Exercises: Sidelying   Other Sidelying Exercise horizontal abd / thoracic rotation x 10 reps each side, with red band   Lumbar Exercises: Stretches   Passive Hamstring Stretch 2 reps;30 seconds   Lumbar Exercises: Aerobic   Stationary Bike NuStep L4: 6 min (arms/legs)   Lumbar Exercises: Standing   Row Both;15 reps;Theraband   Theraband Level (Row) Level 3 (Green)   Modalities   Modalities Iontophoresis;Moist Heat   Moist Heat Therapy   Number Minutes Moist Heat 12 Minutes   Moist Heat Location Cervical  upper thoracic   Iontophoresis   Type of Iontophoresis Dexamethasone   Location Lt achilles at calcaneous   Dose 1.0 cc   Time 6 hr patch    Manual Therapy   Manual Therapy Myofascial release   Myofascial Release Suboccipital  release, MFR to cervical paraspinals, upper trap, levator.  Gentle manual traction x 30 sec 5 reps.    Ankle Exercises: Stretches   Soleus Stretch 30 seconds;3 reps  each leg   Gastroc Stretch 30 seconds;3 reps  each leg   Neck Exercises: Stretches   Other Neck Stretches Doorway stretch 3 positions 30 sec each            PT Short Term Goals - 11/03/15 1136    PT SHORT TERM GOAL #1   Title I with initial HEP ( 11/08/15)    Time 3   Period Weeks   Status Achieved   PT SHORT TERM GOAL #2   Title increase bilat dorsiflexion =/> 15 degreees. ( 11/08/15)    Time 3   Period Weeks   Status On-going   PT SHORT TERM GOAL #3   Title increase Lt hip abduction =/< 5-/5 ( 11/08/15)    Time 3   Period Weeks   Status Achieved   PT SHORT TERM GOAL #4   Title demo decreased low  back pain to allow palpation and spinal mobs. ( 11/08/15)    Time 3   Period Weeks   Status On-going           PT Long Term Goals - 10/27/15 1155    PT LONG TERM GOAL #1   Title I with advanced HEP ( 11/29/15)    Time 6   Period Weeks   Status On-going   PT LONG TERM GOAL #2   Title increase cervical rotation =/> 65 degrees to assist with driving ( 1/47/82)    Period Weeks   Status Achieved   PT LONG TERM GOAL #3   Title report Lt achilles pain =/> 80% improved to allow her to ambulate alternating on stairs (11/29/15)    Time 6   Period Weeks   Status On-going   PT LONG TERM GOAL #4   Title report overall back/neck pain reduced =/> 50% with daily activity ( 11/29/15)    Time 6   Period Weeks   Status On-going   PT LONG TERM GOAL #5   Title improve FOTO =/< 26% limited. ( 11/29/15)    Time 6   Period Weeks   Status On-going               Plan - 11/10/15 1222    Clinical Impression Statement Pt demonstrated improving ankle DF ROM.  Pt tolerated all exercises well, without increase in pain.  Some guarding with manual therapy to upper back/neck.  Pt reported reduced pain and tension in neck at end of session after use of MHP.  Progressing towards goals.    Pt will benefit from skilled therapeutic intervention in order to improve on the following deficits Postural dysfunction;Decreased strength;Hypomobility;Pain;Increased edema;Increased muscle spasms;Decreased range of motion   Rehab Potential Good   PT Frequency 2x / week   PT Duration 6 weeks   PT Treatment/Interventions Traction;Ultrasound;Neuromuscular re-education;Patient/family education;Passive range of motion;Dry needling;Stair training;Cryotherapy;Electrical Stimulation;Iontophoresis 66m/ml Dexamethasone;Moist Heat;Therapeutic exercise;Manual techniques;Vasopneumatic Device;Taping   PT Next Visit Plan Continue spinal stabilization and postural strengthening; continue ionto treatment.    Consulted and Agree with  Plan of Care Patient        Problem List Patient Active Problem List   Diagnosis Date Noted  . Cervical pain (neck) 10/02/2015  . Achilles tendinitis of left lower extremity 09/06/2015  . Paresthesia and pain of right extremity 09/06/2015  . Splenic artery aneurysm ([place] 01/21/2013  .  Diverticulitis of colon without hemorrhage 01/21/2013  . Chest pain 01/11/2011  . COSTOCHONDRITIS 01/01/2011  . FATIGUE 01/01/2011  . DYSPNEA ON EXERTION 01/01/2011  . BENIGN NEOPLASM OF OTHER SPECIFIED SITES 12/18/2009   Kerin Perna, PTA 11/10/2015 12:29 PM  [place], [place], [address] Phone: [phone]   Fax:  [phone]  Name: JAEDIN TRUMBO MRN: [id number] Date of Birth: 1955-11-14    PHYSICAL THERAPY DISCHARGE SUMMARY  Visits from Start of Care: 6 Current functional level related to goals / functional outcomes: unknown   Remaining deficits: unknown   Education / Equipment: Initial HEP Plan:                                                    Patient goals were partially met. Patient is being discharged due to not returning since the last visit.  ?????    Jeral Pinch, PT 12/04/2015 2:19 PM

## 2015-11-17 ENCOUNTER — Ambulatory Visit (INDEPENDENT_AMBULATORY_CARE_PROVIDER_SITE_OTHER): Payer: Self-pay | Admitting: Neurology

## 2015-11-17 ENCOUNTER — Encounter: Payer: Self-pay | Admitting: Neurology

## 2015-11-17 ENCOUNTER — Ambulatory Visit (INDEPENDENT_AMBULATORY_CARE_PROVIDER_SITE_OTHER): Payer: BC Managed Care – PPO | Admitting: Neurology

## 2015-11-17 DIAGNOSIS — G5711 Meralgia paresthetica, right lower limb: Secondary | ICD-10-CM

## 2015-11-17 DIAGNOSIS — R202 Paresthesia of skin: Secondary | ICD-10-CM

## 2015-11-17 DIAGNOSIS — M79609 Pain in unspecified limb: Secondary | ICD-10-CM

## 2015-11-17 NOTE — Procedures (Signed)
     HISTORY:  Stephanie [name] is a 61 year old patient with a history of obesity with a 4-6 month history of some dysesthesias and shocklike sensations in the anterolateral aspect of her right thigh in the distal third. The patient denies any weakness of the right leg, she does have some low back pain at times. She is being evaluated for these symptoms.  NERVE CONDUCTION STUDIES:  Nerve conduction studies were performed on both lower extremities. The distal motor latencies and motor amplitudes for the peroneal and posterior tibial nerves were within normal limits. The nerve conduction velocities for these nerves were also normal. The H reflex latencies were normal. The sensory latencies for the peroneal nerves, and for the saphenous sensory nerves were within normal limits.   EMG STUDIES:  EMG study was performed on the right lower extremity:  The tibialis anterior muscle reveals 2 to 4K motor units with full recruitment. No fibrillations or positive waves were seen. The peroneus tertius muscle reveals 2 to 4K motor units with full recruitment. No fibrillations or positive waves were seen. The medial gastrocnemius muscle reveals 1 to 3K motor units with full recruitment. No fibrillations or positive waves were seen. The vastus lateralis muscle reveals 2 to 4K motor units with full recruitment. No fibrillations or positive waves were seen. The iliopsoas muscle reveals 2 to 4K motor units with full recruitment. No fibrillations or positive waves were seen. The biceps femoris muscle (long head) reveals 2 to 4K motor units with full recruitment. No fibrillations or positive waves were seen. The lumbosacral paraspinal muscles were tested at 3 levels, and revealed no abnormalities of insertional activity at all 3 levels tested. There was good relaxation.   IMPRESSION:  Nerve conduction studies done on both lower extremities were within normal limits. No evidence of a peripheral neuropathy is seen.  EMG evaluation of the right lower extremity is unremarkable, without evidence of an overlying lumbosacral radiculopathy. Clinically, the patient appears to have symptoms within the distal lateral femoral cutaneous nerve on the right thigh.  Jill Alexanders MD 11/17/2015 12:38 PM  [facility] [address] [place], [place] [address]  Phone [phone] Fax [phone]

## 2015-11-17 NOTE — Progress Notes (Signed)
Please refer to EMG and nerve conduction study procedure note. 

## 2015-12-08 ENCOUNTER — Emergency Department (INDEPENDENT_AMBULATORY_CARE_PROVIDER_SITE_OTHER): Payer: BC Managed Care – PPO

## 2015-12-08 ENCOUNTER — Emergency Department (INDEPENDENT_AMBULATORY_CARE_PROVIDER_SITE_OTHER)
Admission: EM | Admit: 2015-12-08 | Discharge: 2015-12-08 | Disposition: A | Discharge status: Home / Self Care | Payer: BC Managed Care – PPO | Source: Home / Self Care | Attending: Family Medicine | Admitting: Family Medicine

## 2015-12-08 DIAGNOSIS — M25541 Pain in joints of right hand: Secondary | ICD-10-CM

## 2015-12-08 DIAGNOSIS — Z23 Encounter for immunization: Secondary | ICD-10-CM | POA: Diagnosis not present

## 2015-12-08 DIAGNOSIS — S60351A Superficial foreign body of right thumb, initial encounter: Secondary | ICD-10-CM

## 2015-12-08 DIAGNOSIS — M79644 Pain in right finger(s): Secondary | ICD-10-CM

## 2015-12-08 MED ORDER — TETANUS-DIPHTH-ACELL PERTUSSIS 5-2.5-18.5 LF-MCG/0.5 IM SUSP
0.5000 mL | Freq: Once | INTRAMUSCULAR | Status: AC
  Administered 2015-12-08: 0.5 mL via INTRAMUSCULAR

## 2015-12-08 MED ORDER — CEPHALEXIN 500 MG PO CAPS: 500.0000 mg | ORAL_CAPSULE | Freq: Two times a day (BID) | ORAL | Status: DC

## 2015-12-08 NOTE — Discharge Instructions (Signed)
You may take 400-600mg  Ibuprofen (Motrin) every 6-8 hours for pain  Alternate with Tylenol  You may take 500mg  Tylenol every 4-6 hours as needed for pain    Please take antibiotics as prescribed and be sure to complete entire course to help prevent infection from forming.

## 2015-12-08 NOTE — ED Notes (Signed)
Pt was pulling up old plant stalks, and one of the stalks pierced through her thumb.

## 2015-12-08 NOTE — ED Provider Notes (Signed)
CSN: [id number]     Arrival date & time 12/08/15  1813 History   First MD Initiated Contact with Patient 12/08/15 1825     Chief Complaint  Patient presents with  . Finger Injury    right thumb   (Consider location/radiation/quality/duration/timing/severity/associated sxs/prior Treatment) HPI  The pt is a 60yo female presenting to [facility] with c/o Right thumb pain that started about 1 hour PTA. Pt states she was pulling up on an old plant stalk and got a splintered piece of stalk stuck in her thumb.  Pain is aching and sore.  Pain is 3/10 at this time. No pain medication PTA.    Past Medical History  Diagnosis Date  . Plantar fasciitis   . Nephrolithiasis   . Diverticulitis 01/21/13   Past Surgical History  Procedure Laterality Date  . Cholecystectomy  1982  . Tonsilectomy, adenoidectomy, bilateral myringotomy and tubes      at age 17 approx.   . Tubal ligation  1993   Family History  Problem Relation Age of Onset  . Coronary artery disease Other     family hx of/ heart valve replacement  . Alcohol abuse Father   . Cancer Father   . Prostate cancer Father   . COPD Father   . Hyperlipidemia Father   . Cancer Mother   . Hyperlipidemia Mother   . Colon cancer Neg Hx    Social History  Substance Use Topics  . Smoking status: Former Smoker -- 0.20 packs/day for 2 years    Types: Cigarettes    Quit date: 10/14/1976  . Smokeless tobacco: Never Used  . Alcohol Use: No   OB History    No data available     Review of Systems  Musculoskeletal: Positive for myalgias. Negative for joint swelling and arthralgias.  Skin: Positive for wound. Negative for color change.       Right thumb  Neurological: Negative for weakness and numbness.  Psychiatric/Behavioral: Negative for self-injury.    Allergies  Review of patient's allergies indicates no known allergies.  Home Medications   Prior to Admission medications   Medication Sig Start Date End Date Taking? Authorizing Provider   cephALEXin (KEFLEX) 500 MG capsule Take 1 capsule (500 mg total) by mouth 2 (two) times daily. For 7 days 12/08/15   Noland Fordyce, PA-C  gabapentin (NEURONTIN) 100 MG capsule Take 1 capsule (100 mg total) by mouth 3 (three) times daily. 10/12/15   Gregor Hams, MD  meloxicam (MOBIC) 15 MG tablet Take 1 tablet (15 mg total) by mouth daily. Take with food each morning 09/06/15   Gregor Hams, MD  nitroGLYCERIN [facility]) 0.2 mg/hr patch Apply 1/4 patch for 12 hours each day 08/21/15   Kandra Nicolas, MD  omeprazole (PRILOSEC) 40 MG capsule Take 1 capsule (40 mg total) by mouth daily. 09/06/15   Gregor Hams, MD   Meds Ordered and Administered this Visit   Medications  Tdap (BOOSTRIX) injection 0.5 mL (0.5 mLs Intramuscular Given 12/08/15 1856)    BP 126/84 mmHg  Pulse 89  Temp(Src) 97.8 F (36.6 C) (Oral)  Ht 5' (1.524 m)  Wt 226 lb (102.513 kg)  BMI 44.14 kg/m2  SpO2 97% No data found.   Physical Exam  Constitutional: She is oriented to person, place, and time. She appears well-developed and well-nourished.  HENT:  Head: Normocephalic and atraumatic.  Eyes: EOM are normal.  Neck: Normal range of motion.  Cardiovascular: Normal rate.   Pulmonary/Chest: Effort  normal.  Musculoskeletal: Normal range of motion. She exhibits edema and tenderness.  Right thumb: foreign body in thumb, mild edema. Tenderness at tight of FB. Full ROM (see skin exam)  Neurological: She is alert and oriented to person, place, and time.  Skin: Skin is warm and dry.  Right thumb, volar aspect: 2cm beige foreign body visualized entry and exit point just under the skin of thumb. No active bleeding or discharge.   Psychiatric: She has a normal mood and affect. Her behavior is normal.  Nursing note and vitals reviewed.   ED Course  .Foreign Body Removal Date/Time: 12/08/2015 7:56 PM Performed by: Noland Fordyce Authorized by: Theone Murdoch A Consent: Verbal consent obtained. Risks and benefits: risks,  benefits and alternatives were discussed Consent given by: patient Patient understanding: patient states understanding of the procedure being performed Patient consent: the patient's understanding of the procedure matches consent given Site marked: the operative site was marked Imaging studies: imaging studies available Required items: required blood products, implants, devices, and special equipment available Patient identity confirmed: verbally with patient Body area: skin General location: upper extremity Location details: right thumb Anesthesia: local infiltration Local anesthetic: lidocaine 2% without epinephrine Anesthetic total: 1 ml Patient sedated: no Localization method: visualized Removal mechanism: forceps and irrigation Dressing: antibiotic ointment Tendon involvement: none Depth: subcutaneous Complexity: simple 1 objects recovered. Objects recovered: wood splinter Post-procedure assessment: foreign body removed Patient tolerance: Patient tolerated the procedure well with no immediate complications   (including critical care time)  Labs Review Labs Reviewed - No data to display  Imaging Review Dg Finger Thumb Right  12/08/2015  CLINICAL DATA:  Splinter stuck in end of right thumb. EXAM: RIGHT THUMB 2+V COMPARISON:  None FINDINGS: No radiopaque foreign bodies within the right thumb soft tissues. There are bone densities adjacent to the base of the distal phalanx of the right thumb, possibly related to old injury. These appear well corticated. Degenerative changes at the right thumb IP joint. No acute fracture, subluxation or dislocation. IMPRESSION: No acute bony abnormality or radiopaque foreign body. Electronically Signed   By: Rolm Baptise M.D.   On: 12/08/2015 18:56     MDM   1. Acute foreign body of right thumb, initial encounter   2. Thumb pain, right     Pt presenting to [facility] with splinter in Right thumb. PMS of Right thumb in tact.   Splinter removed w/o  immediate complication Pt placed on antibiotic to prevent infection as wound was due to a penetration of the skin from a plant.  Rx: keflex May take acetaminophen and ibuprofen for pain.   Home care instructions provided. F/u with PCP in 4-5 days if symptoms not improving, sooner if worsening. Patient verbalized understanding and agreement with treatment plan.     Noland Fordyce, PA-C 12/08/15 [phone]

## 2015-12-21 ENCOUNTER — Emergency Department (INDEPENDENT_AMBULATORY_CARE_PROVIDER_SITE_OTHER)
Admission: EM | Admit: 2015-12-21 | Discharge: 2015-12-21 | Disposition: A | Discharge status: Home / Self Care | Payer: BC Managed Care – PPO | Source: Home / Self Care | Attending: Emergency Medicine | Admitting: Emergency Medicine

## 2015-12-21 ENCOUNTER — Encounter: Payer: Self-pay | Admitting: *Deleted

## 2015-12-21 DIAGNOSIS — H1031 Unspecified acute conjunctivitis, right eye: Secondary | ICD-10-CM | POA: Diagnosis not present

## 2015-12-21 DIAGNOSIS — H01003 Unspecified blepharitis right eye, unspecified eyelid: Secondary | ICD-10-CM | POA: Diagnosis not present

## 2015-12-21 MED ORDER — CEPHALEXIN 500 MG PO CAPS: 500.0000 mg | ORAL_CAPSULE | Freq: Three times a day (TID) | ORAL | Status: AC

## 2015-12-21 MED ORDER — POLYMYXIN B-TRIMETHOPRIM 10000-0.1 UNIT/ML-% OP SOLN: OPHTHALMIC | Status: DC

## 2015-12-21 NOTE — ED Provider Notes (Signed)
CSN: [id number]     Arrival date & time 12/21/15  0805 History   First MD Initiated Contact with Patient 12/21/15 0818     Chief Complaint  Patient presents with  . Eye Drainage   (Consider location/radiation/quality/duration/timing/severity/associated sxs/prior Treatment) Patient is a 60 y.o. female presenting with conjunctivitis.  Conjunctivitis This is a new problem. The current episode started 6 to 12 hours ago. The problem has been gradually worsening. Pertinent negatives include no chest pain, no abdominal pain, no headaches and no shortness of breath. Exacerbated by: Touching her eye. Nothing relieves the symptoms. Treatments tried: Warm compresses. The treatment provided mild (Minimal) relief.   denies foreign body sensation. She does not wear contact lenses. Denies any definite URI prodrome.    Past Medical History  Diagnosis Date  . Plantar fasciitis   . Nephrolithiasis   . Diverticulitis 01/21/13   Past Surgical History  Procedure Laterality Date  . Cholecystectomy  1982  . Tonsilectomy, adenoidectomy, bilateral myringotomy and tubes      at age 4 approx.   . Tubal ligation  1993   Family History  Problem Relation Age of Onset  . Coronary artery disease Other     family hx of/ heart valve replacement  . Alcohol abuse Father   . Cancer Father   . Prostate cancer Father   . COPD Father   . Hyperlipidemia Father   . Cancer Mother   . Hyperlipidemia Mother   . Colon cancer Neg Hx    Social History  Substance Use Topics  . Smoking status: Former Smoker -- 0.20 packs/day for 2 years    Types: Cigarettes    Quit date: 10/14/1976  . Smokeless tobacco: Never Used  . Alcohol Use: No   OB History    No data available     Review of Systems  Respiratory: Negative for shortness of breath.   Cardiovascular: Negative for chest pain.  Gastrointestinal: Negative for abdominal pain.  Neurological: Negative for headaches.  All other systems reviewed and are  negative.   Allergies  Review of patient's allergies indicates no known allergies.  Home Medications   Prior to Admission medications   Medication Sig Start Date End Date Taking? Authorizing Provider  cephALEXin (KEFLEX) 500 MG capsule Take 1 capsule (500 mg total) by mouth 3 (three) times daily. For 10 days 12/21/15 12/31/15  Jacqulyn Cane, MD  trimethoprim-polymyxin b [facility]) ophthalmic solution 2 drop in affected eye(s) every 4 hours (while awake) x 7 days 12/21/15   Jacqulyn Cane, MD   Meds Ordered and Administered this Visit  Medications - No data to display  BP 125/76 mmHg  Pulse 78  Temp(Src) 97.9 F (36.6 C) (Oral)  Resp 16  Wt 229 lb (103.874 kg)  SpO2 95% No data found.   Physical Exam  Constitutional: She is oriented to person, place, and time. She appears well-developed and well-nourished. No distress.  Uncomfortable from right eye pain.  HENT:  Head: Normocephalic and atraumatic. Head is without right periorbital erythema and without left periorbital erythema.  Right Ear: Tympanic membrane, external ear and ear canal normal.  Left Ear: Tympanic membrane, external ear and ear canal normal.  Nose: Nose normal.  Mouth/Throat: Oropharynx is clear and moist. No oral lesions.  Eyes: EOM are normal. Pupils are equal, round, and reactive to light. Lids are everted and swept, no foreign bodies found. Right eye exhibits discharge. Right eye exhibits no hordeolum. No foreign body present in the right eye. Left eye  exhibits no discharge and no hordeolum. No foreign body present in the left eye. Right conjunctiva is injected (Very inflamed. No foreign body seen). Right conjunctiva has no hemorrhage. Left conjunctiva is not injected (Mildly inflamed). Left conjunctiva has no hemorrhage. No scleral icterus. Right eye exhibits normal extraocular motion. Left eye exhibits normal extraocular motion.  Slit lamp exam:      The right eye shows no corneal abrasion and no anterior chamber  bulge.       The left eye shows no corneal abrasion and no anterior chamber bulge.    Anterior chamber clear bilaterally  Neck: Normal range of motion. Neck supple.  Cardiovascular: Normal rate.   Pulmonary/Chest: Effort normal.  Abdominal: She exhibits no distension.  Musculoskeletal: Normal range of motion.  Lymphadenopathy:    She has no cervical adenopathy.  Neurological: She is alert and oriented to person, place, and time.  Skin: Skin is warm. No rash noted.  Psychiatric: She has a normal mood and affect.  Nursing note and vitals reviewed.   ED Course  Procedures (including critical care time)  Labs Review Labs Reviewed - No data to display  Imaging Review No results found.   Visual Acuity Review  Right Eye Distance:   patient declined checking any visual acuity Left Eye Distance:    Bilateral Distance:    Right Eye Near:   Left Eye Near:    Bilateral Near:         MDM   1. Acute conjunctivitis, right eye   2. Blepharitis, right eye    No evidence of abscess or fluctuance or periorbital cellulitis.  Treatment options discussed, as well as risks, benefits, alternatives. Patient voiced understanding and agreement with the following plans: Discharge Medication List as of 12/21/2015  9:22 AM    START taking these medications   Details  cephALEXin (KEFLEX) 500 MG capsule Take 1 capsule (500 mg total) by mouth 3 (three) times daily. For 10 days, Starting 12/21/2015, Until Sun 12/31/15, Normal    trimethoprim-polymyxin b (POLYTRIM) ophthalmic solution 2 drop in affected eye(s) every 4 hours (while awake) x 7 days, Normal       Continue warm compresses and other symptomatic care. Note written to excuse from work for at least 2 days. Follow-up with your eye doctor tomorrow if not improving, or sooner if symptoms become worse. Precautions discussed. Red flags discussed. Questions invited and answered. Patient voiced understanding and agreement.     Jacqulyn Cane, MD 12/21/15 1158

## 2015-12-21 NOTE — ED Notes (Signed)
Pt c/o right eye pain, swelling and drainage x 130am today. Left eye minimal drainage.

## 2016-06-10 ENCOUNTER — Ambulatory Visit: Payer: BC Managed Care – PPO | Admitting: Family Medicine

## 2016-10-04 ENCOUNTER — Telehealth: Payer: Self-pay | Admitting: Vascular Surgery

## 2016-10-04 NOTE — Telephone Encounter (Signed)
As per Recall Report, pt is due for a 3 yr follow up and CT scan. I called the patient to see if she is still interested to come to our office. She said that she is doing ok and will call if something does arise.

## 2016-10-15 ENCOUNTER — Emergency Department (INDEPENDENT_AMBULATORY_CARE_PROVIDER_SITE_OTHER)
Admission: EM | Admit: 2016-10-15 | Discharge: 2016-10-15 | Disposition: A | Discharge status: Home / Self Care | Payer: BC Managed Care – PPO | Source: Home / Self Care | Attending: Family Medicine | Admitting: Family Medicine

## 2016-10-15 DIAGNOSIS — L03213 Periorbital cellulitis: Secondary | ICD-10-CM

## 2016-10-15 DIAGNOSIS — J069 Acute upper respiratory infection, unspecified: Secondary | ICD-10-CM

## 2016-10-15 DIAGNOSIS — H6641 Suppurative otitis media, unspecified, right ear: Secondary | ICD-10-CM

## 2016-10-15 DIAGNOSIS — B9789 Other viral agents as the cause of diseases classified elsewhere: Secondary | ICD-10-CM

## 2016-10-15 MED ORDER — GENTAMICIN SULFATE 0.3 % OP SOLN: 1.0000 [drp] | OPHTHALMIC | 0 refills | Status: DC

## 2016-10-15 MED ORDER — CLINDAMYCIN HCL 300 MG PO CAPS: 300.0000 mg | ORAL_CAPSULE | Freq: Three times a day (TID) | ORAL | 0 refills | Status: DC

## 2016-10-15 NOTE — ED Triage Notes (Signed)
Woke this am with eye drainage and redness

## 2016-10-15 NOTE — ED Provider Notes (Signed)
[facility]    CSN: [id number] Arrival date & time: 10/15/16  1448     History   Chief Complaint Chief Complaint  Patient presents with  . Eye Drainage    HPI Stephanie [name] is a 61 y.o. female.   Patient complains of four day history of typical cold-like symptoms developing over several days, including mild sore throat, sinus congestion, fatigue, and cough.  Yesterday she developed right earache, and last night she developed swelling/redness of her left eye and eyelids.   The history is provided by the patient.    Past Medical History:  Diagnosis Date  . Diverticulitis 01/21/13  . Nephrolithiasis   . Plantar fasciitis     Patient Active Problem List   Diagnosis Date Noted  . Cervical pain (neck) 10/02/2015  . Achilles tendinitis of left lower extremity 09/06/2015  . Paresthesia and pain of right extremity 09/06/2015  . Splenic artery aneurysm ([place]) 01/21/2013  . Diverticulitis of colon without hemorrhage 01/21/2013  . Chest pain 01/11/2011  . COSTOCHONDRITIS 01/01/2011  . FATIGUE 01/01/2011  . DYSPNEA ON EXERTION 01/01/2011  . BENIGN NEOPLASM OF OTHER SPECIFIED SITES 12/18/2009    Past Surgical History:  Procedure Laterality Date  . CHOLECYSTECTOMY  1982  . TONSILECTOMY, ADENOIDECTOMY, BILATERAL MYRINGOTOMY AND TUBES     at age 49 approx.   . TUBAL LIGATION  1993    OB History    No data available       Home Medications    Prior to Admission medications   Medication Sig Start Date End Date Taking? Authorizing Provider  clindamycin (CLEOCIN) 300 MG capsule Take 1 capsule (300 mg total) by mouth 3 (three) times daily. 10/15/16   Kandra Nicolas, MD  gentamicin (GARAMYCIN) 0.3 % ophthalmic solution Place 1 drop into the left eye every 4 (four) hours. 10/15/16   Kandra Nicolas, MD  trimethoprim-polymyxin b (POLYTRIM) ophthalmic solution 2 drop in affected eye(s) every 4 hours (while awake) x 7 days 12/21/15   Jacqulyn Cane, MD    Family  History Family History  Problem Relation Age of Onset  . Coronary artery disease Other     family hx of/ heart valve replacement  . Alcohol abuse Father   . Cancer Father   . Prostate cancer Father   . COPD Father   . Hyperlipidemia Father   . Cancer Mother   . Hyperlipidemia Mother   . Colon cancer Neg Hx     Social History Social History  Substance Use Topics  . Smoking status: Former Smoker    Packs/day: 0.20    Years: 2.00    Types: Cigarettes    Quit date: 10/14/1976  . Smokeless tobacco: Never Used  . Alcohol use No     Allergies   Patient has no known allergies.   Review of Systems Review of Systems + sore throat + cough No pleuritic pain No wheezing + nasal congestion + post-nasal drainage No sinus pain/pressure + itchy/red eyes + right earache No hemoptysis No SOB No fever, + chills No nausea No vomiting No abdominal pain No diarrhea No urinary symptoms No skin rash + fatigue No myalgias No headache Used OTC meds without relief   Physical Exam Triage Vital Signs ED Triage Vitals  Enc Vitals Group     BP 10/15/16 1544 136/71     Pulse Rate 10/15/16 1544 82     Resp --      Temp 10/15/16 1544 97.9 F (36.6 C)  Temp Source 10/15/16 1544 Oral     SpO2 10/15/16 1544 95 %     Weight 10/15/16 1546 228 lb (103.4 kg)     Height 10/15/16 1546 5\' 1"  (1.549 m)     Head Circumference --      Peak Flow --      Pain Score 10/15/16 1547 3     Pain Loc --      Pain Edu? --      Excl. in [place]? --    No data found.   Updated Vital Signs BP 136/71   Pulse 82   Temp 97.9 F (36.6 C) (Oral)   Ht 5\' 1"  (1.549 m)   Wt 228 lb (103.4 kg)   SpO2 95%   BMI 43.08 kg/m   Visual Acuity Right Eye Distance: 20/25 Left Eye Distance: 20/40 Bilateral Distance: 20/25  Right Eye Near:   Left Eye Near:    Bilateral Near:     Physical Exam Nursing notes and Vital Signs reviewed. Appearance:  Patient appears stated age, and in no acute  distress Eyes:  Pupils are equal, round, and reactive to light and accomodation.  Extraocular movement is intact.  Right conjunctivae are not inflamed.  Left conjunctivae injected with mucoid discharge.  Left upper and lower lids are mildly swollen, erythematous, and tender to palpation.  Patient denies pain with eye movement. Ears:  Canals normal.  Right tympanic membrane erythematous with decreased landmarks.  Left tympanic membrane normal.  Nose:  Congested turbinates.  No sinus tenderness.     Pharynx:  Normal Neck:  Supple.  Tender enlarged posterior/lateral nodes are palpated bilaterally  Lungs:  Clear to auscultation.  Breath sounds are equal.  Moving air well. Heart:  Regular rate and rhythm without murmurs, rubs, or gallops.  Abdomen:  Nontender without masses or hepatosplenomegaly.  Bowel sounds are present.  No CVA or flank tenderness.  Extremities:  No edema.  Skin:  No rash present.    UC Treatments / Results  Labs (all labs ordered are listed, but only abnormal results are displayed) Labs Reviewed - No data to display  EKG  EKG Interpretation None       Radiology No results found.  Procedures Procedures (including critical care time)  Medications Ordered in UC Medications - No data to display   Initial Impression / Assessment and Plan / UC Course  I have reviewed the triage vital signs and the nursing notes.  Pertinent labs & imaging results that were available during my care of the patient were reviewed by me and considered in my medical decision making (see chart for details).  Clinical Course   Begin clindamycin 300mg  TID, and Gentamicin ophth. Suspension. Apply warm compress to left eye several times daily. Take plain guaifenesin (1200mg  extended release tabs such as Mucinex) twice daily, with plenty of water, for cough and congestion. Get adequate rest.   May use Afrin nasal spray (or generic oxymetazoline) twice daily for about 5 days and then  discontinue.  Also recommend using saline nasal spray several times daily and saline nasal irrigation (AYR is a common brand).  Use Flonase nasal spray each morning after using Afrin nasal spray and saline nasal irrigation. Try warm salt water gargles for sore throat.  Stop all antihistamines for now, and other non-prescription cough/cold preparations. May take Delsym Cough Suppressant at bedtime for nighttime cough. Followup with ophthalmologist if eye not improved 2 to 3 days.    Follow-up with family doctor if not  improving about one week.     Final Clinical Impressions(s) / UC Diagnoses   Final diagnoses:  Preseptal cellulitis of left eye  Suppurative otitis media of right ear, unspecified chronicity  Viral URI with cough    New Prescriptions New Prescriptions   CLINDAMYCIN (CLEOCIN) 300 MG CAPSULE    Take 1 capsule (300 mg total) by mouth 3 (three) times daily.   GENTAMICIN (GARAMYCIN) 0.3 % OPHTHALMIC SOLUTION    Place 1 drop into the left eye every 4 (four) hours.     Kandra Nicolas, MD 11/04/16 [id number]

## 2016-10-15 NOTE — Discharge Instructions (Signed)
Apply warm compress to left eye several times daily. Take plain guaifenesin (1200mg  extended release tabs such as Mucinex) twice daily, with plenty of water, for cough and congestion. Get adequate rest.   May use Afrin nasal spray (or generic oxymetazoline) twice daily for about 5 days and then discontinue.  Also recommend using saline nasal spray several times daily and saline nasal irrigation (AYR is a common brand).  Use Flonase nasal spray each morning after using Afrin nasal spray and saline nasal irrigation. Try warm salt water gargles for sore throat.  Stop all antihistamines for now, and other non-prescription cough/cold preparations. May take Delsym Cough Suppressant at bedtime for nighttime cough.    Follow-up with family doctor if not improving about one week.

## 2016-12-20 LAB — HM MAMMOGRAPHY

## 2016-12-23 ENCOUNTER — Encounter: Payer: Self-pay | Admitting: Family Medicine

## 2017-04-01 ENCOUNTER — Encounter: Payer: Self-pay | Admitting: *Deleted

## 2017-04-01 ENCOUNTER — Emergency Department (INDEPENDENT_AMBULATORY_CARE_PROVIDER_SITE_OTHER): Payer: BC Managed Care – PPO

## 2017-04-01 ENCOUNTER — Emergency Department (INDEPENDENT_AMBULATORY_CARE_PROVIDER_SITE_OTHER)
Admission: EM | Admit: 2017-04-01 | Discharge: 2017-04-01 | Disposition: A | Discharge status: Home / Self Care | Payer: BC Managed Care – PPO | Source: Home / Self Care | Attending: Family Medicine | Admitting: Family Medicine

## 2017-04-01 DIAGNOSIS — M7050 Other bursitis of knee, unspecified knee: Secondary | ICD-10-CM

## 2017-04-01 DIAGNOSIS — M25561 Pain in right knee: Secondary | ICD-10-CM | POA: Diagnosis not present

## 2017-04-01 DIAGNOSIS — M7631 Iliotibial band syndrome, right leg: Secondary | ICD-10-CM

## 2017-04-01 MED ORDER — MELOXICAM 15 MG PO TABS: 15.0000 mg | ORAL_TABLET | Freq: Every day | ORAL | 1 refills | Status: DC

## 2017-04-01 NOTE — ED Triage Notes (Signed)
Patient c/o 2 months of intermittent right knee pain without injury. Recently worse. No previous injury.

## 2017-04-01 NOTE — Discharge Instructions (Signed)
Apply ice pack for 20 to 30 minutes, 3 to 4 times daily  Continue until pain and swelling decrease.  Begin range of motion and stretching exercises.

## 2017-04-01 NOTE — ED Provider Notes (Signed)
[facility]    CSN: [id number] Arrival date & time: 04/01/17  2841     History   Chief Complaint Chief Complaint  Patient presents with  . Knee Pain    right    HPI Stephanie [name] is a 61 y.o. female.   Patient complains of gradually increasing pain in her right knee for about two months, most noticeable when walking and exiting/entering a car.  She tends to have mild swelling in her knee at the end of a day.  She notices a vague clicking sensation when flexing, and the knee sometimes feels unstable.  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  She recalls injuring her right knee about 15 years ago.    Knee Pain  Location:  Knee Time since incident:  2 months Injury: no   Knee location:  R knee Pain details:    Quality:  Aching   Radiates to:  Does not radiate   Severity:  Mild   Onset quality:  Gradual   Duration:  2 months   Timing:  Intermittent   Progression:  Worsening Chronicity:  New Prior injury to area:  Yes Relieved by:  NSAIDs Worsened by:  Bearing weight and activity Associated symptoms: stiffness and swelling   Associated symptoms: no back pain, no decreased ROM, no muscle weakness, no numbness and no tingling   Risk factors: obesity     Past Medical History:  Diagnosis Date  . Diverticulitis 01/21/13  . Nephrolithiasis   . Plantar fasciitis     Patient Active Problem List   Diagnosis Date Noted  . Cervical pain (neck) 10/02/2015  . Achilles tendinitis of left lower extremity 09/06/2015  . Paresthesia and pain of right extremity 09/06/2015  . Splenic artery aneurysm ([place]) 01/21/2013  . Diverticulitis of colon without hemorrhage 01/21/2013  . Chest pain 01/11/2011  . COSTOCHONDRITIS 01/01/2011  . FATIGUE 01/01/2011  . DYSPNEA ON EXERTION 01/01/2011  . BENIGN NEOPLASM OF OTHER SPECIFIED SITES 12/18/2009    Past Surgical History:  Procedure Laterality Date  . CHOLECYSTECTOMY  1982  . TONSILECTOMY,  ADENOIDECTOMY, BILATERAL MYRINGOTOMY AND TUBES     at age 67 approx.   . TUBAL LIGATION  1993    OB History    No data available       Home Medications    Prior to Admission medications   Medication Sig Start Date End Date Taking? Authorizing Provider  meloxicam (MOBIC) 15 MG tablet Take 1 tablet (15 mg total) by mouth daily. Take with food each morning 04/01/17   Kandra Nicolas, MD    Family History Family History  Problem Relation Age of Onset  . Coronary artery disease Other        family hx of/ heart valve replacement  . Alcohol abuse Father   . Cancer Father   . Prostate cancer Father   . COPD Father   . Hyperlipidemia Father   . Cancer Mother   . Hyperlipidemia Mother   . Colon cancer Neg Hx     Social History Social History  Substance Use Topics  . Smoking status: Former Smoker    Packs/day: 0.20    Years: 2.00    Types: Cigarettes    Quit date: 10/14/1976  . Smokeless tobacco: Never Used  . Alcohol use No     Allergies   Patient has no known allergies.   Review of Systems Review of Systems  Musculoskeletal: Positive for stiffness. Negative for  back pain.  All other systems reviewed and are negative.    Physical Exam Triage Vital Signs ED Triage Vitals [04/01/17 0856]  Enc Vitals Group     BP 106/73     Pulse Rate 97     Resp      Temp      Temp src      SpO2 96 %     Weight 239 lb (108.4 kg)     Height      Head Circumference      Peak Flow      Pain Score 3     Pain Loc      Pain Edu?      Excl. in [place]?    No data found.   Updated Vital Signs BP 106/73 (BP Location: Right Arm)   Pulse 97   Wt 239 lb (108.4 kg)   SpO2 96%   BMI 45.16 kg/m   Visual Acuity Right Eye Distance:   Left Eye Distance:   Bilateral Distance:    Right Eye Near:   Left Eye Near:    Bilateral Near:     Physical Exam  Constitutional: She appears well-developed and well-nourished. No distress.  HENT:  Head: Normocephalic.  Eyes: Pupils are  equal, round, and reactive to light.  Cardiovascular: Normal rate.   Pulmonary/Chest: Effort normal.  Musculoskeletal:       Right knee: She exhibits normal range of motion, no swelling, no effusion, no ecchymosis, no deformity, no erythema, normal alignment, no LCL laxity, normal patellar mobility, normal meniscus and no MCL laxity. Tenderness found. Medial joint line tenderness noted. No patellar tendon tenderness noted.       Legs: Right knee:  No effusion, erythema, or warmth.  Knee stable, negative drawer test.  McMurray test negative.  There is tenderness to palpation over the pes anserine bursa.  There is tenderness to palpation over the iliotibial band and a vague clicking sensation is appreciated with flexion/extension of the knee.  Neurological: She is alert.  Skin: Skin is warm and dry.  Nursing note and vitals reviewed.    UC Treatments / Results  Labs (all labs ordered are listed, but only abnormal results are displayed) Labs Reviewed - No data to display  EKG  EKG Interpretation None       Radiology Dg Knee Complete 4 Views Right  Result Date: 04/01/2017 CLINICAL DATA:  Two months of right knee pain with no known injury EXAM: RIGHT KNEE - COMPLETE 4+ VIEW COMPARISON:  Right knee series of April 21, 2006 FINDINGS: The bones are subjectively adequately mineralized. The joint spaces are well maintained. There is beaking of the tibial spines. A tiny spur arises from the periphery of the articular margin of the medial femoral condyles. There is a spur arising from the superior articular margin of the patella. IMPRESSION: There is no acute bony abnormality of the right knee. 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 Given the persistent symptoms, MRI would be a useful next imaging step. Electronically Signed   By: David  Martinique M.D.   On: 04/01/2017 09:37    Procedures Procedures (including critical care time)  Medications Ordered in UC Medications - No data to  display   Initial Impression / Assessment and Plan / UC Course  I have reviewed the triage vital signs and the nursing notes.  Pertinent labs & imaging results that were available during my care of the patient were reviewed by me and considered in my medical  decision making (see chart for details).      Rx for Mobic 15mg  daily. Apply ice pack for 20 to 30 minutes, 3 to 4 times daily  Continue until pain and swelling decrease. Wear knee brace daytime.  Begin range of motion and stretching exercises. Followup with Dr. Aundria Mems or Dr. Lynne Leader ([facility]) if not improving about two weeks.     Final Clinical Impressions(s) / UC Diagnoses   Final diagnoses:  Iliotibial band tendinitis of right side  Pes anserine bursitis    New Prescriptions New Prescriptions   MELOXICAM (MOBIC) 15 MG TABLET    Take 1 tablet (15 mg total) by mouth daily. Take with food each morning     Kandra Nicolas, MD 04/01/17 1010

## 2017-11-22 ENCOUNTER — Encounter: Payer: Self-pay | Admitting: Emergency Medicine

## 2017-11-22 ENCOUNTER — Emergency Department (INDEPENDENT_AMBULATORY_CARE_PROVIDER_SITE_OTHER)
Admission: EM | Admit: 2017-11-22 | Discharge: 2017-11-22 | Disposition: A | Discharge status: Home / Self Care | Payer: BC Managed Care – PPO | Source: Home / Self Care | Attending: Family Medicine | Admitting: Family Medicine

## 2017-11-22 DIAGNOSIS — R202 Paresthesia of skin: Secondary | ICD-10-CM

## 2017-11-22 DIAGNOSIS — M79622 Pain in left upper arm: Secondary | ICD-10-CM

## 2017-11-22 DIAGNOSIS — R5383 Other fatigue: Secondary | ICD-10-CM

## 2017-11-22 NOTE — Discharge Instructions (Signed)
°  Your neurologic exam today was normal in urgent care but your symptoms could be an early onset of Bell's Palsy or even Shingles.  Please see additional information about both conditions in this packet.  If you develop change in speech, vision, confusion, facial droop, left arm or leg weakness, severe headache, chest pain, return of shortness or breath or other new concerning symptoms develop, please call 911 or have a friend/family member drive you to the closest emergency department.

## 2017-11-22 NOTE — ED Provider Notes (Signed)
[name]    CSN: [id number] Arrival date & time: 11/22/17  1537     History   Chief Complaint Chief Complaint  Patient presents with  . Numbness    HPI Stephanie [name] is a 62 y.o. female.   HPI  Stephanie [name] is a 62 y.o. female presenting to UC with c/o Left sided eye twitching and "numbness" that started earlier this morning.  She also reports feeling fatigued all day and had an episode of SOB while at the grocery store around 10:30AM this morning.  She took a nap when she got home but still felt fatigued and had left eye twitching along with burning pain in Left upper arm.  Hx of intermittent numbness in Left hand for about 3 months but the upper arm pain started today.  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  She denies recent injury or illness. Denies cough, congestion, sore throat, ear pain, fever, chills, n/v/d. Denies chest pain. Denies SOB at this time. Denies hx of CAD or family hx of CAD.  No hx of stroke, shingles or Bell's Palsy. Denies rashes.  Denies decreased sleep or increased stress.  No hx of anxiety or panic attacks.    Past Medical History:  Diagnosis Date  . Diverticulitis 01/21/13  . Nephrolithiasis   . Plantar fasciitis     Patient Active Problem List   Diagnosis Date Noted  . Cervical pain (neck) 10/02/2015  . Achilles tendinitis of left lower extremity 09/06/2015  . Paresthesia and pain of right extremity 09/06/2015  . Splenic artery aneurysm ([place]) 01/21/2013  . Diverticulitis of colon without hemorrhage 01/21/2013  . Chest pain 01/11/2011  . COSTOCHONDRITIS 01/01/2011  . FATIGUE 01/01/2011  . DYSPNEA ON EXERTION 01/01/2011  . BENIGN NEOPLASM OF OTHER SPECIFIED SITES 12/18/2009    Past Surgical History:  Procedure Laterality Date  . CHOLECYSTECTOMY  1982  . TONSILECTOMY, ADENOIDECTOMY, BILATERAL MYRINGOTOMY AND TUBES     at age 71 approx.   . TUBAL LIGATION  1993    OB History    No data available        Home Medications    Prior to Admission medications   Medication Sig Start Date End Date Taking? Authorizing Provider  meloxicam (MOBIC) 15 MG tablet Take 1 tablet (15 mg total) by mouth daily. Take with food each morning 04/01/17   Kandra Nicolas, MD    Family History Family History  Problem Relation Age of Onset  . Coronary artery disease Other        family hx of/ heart valve replacement  . Alcohol abuse Father   . Cancer Father   . Prostate cancer Father   . COPD Father   . Hyperlipidemia Father   . Cancer Mother   . Hyperlipidemia Mother   . Colon cancer Neg Hx     Social History Social History   Tobacco Use  . Smoking status: Former Smoker    Packs/day: 0.20    Years: 2.00    Pack years: 0.40    Types: Cigarettes    Last attempt to quit: 10/14/1976    Years since quitting: 41.1  . Smokeless tobacco: Never Used  Substance Use Topics  . Alcohol use: No  . Drug use: No     Allergies   Patient has no known allergies.   Review of Systems Review of Systems  Constitutional: Positive for fatigue. Negative for chills and fever.  HENT:  Negative for congestion, ear pain, sore throat, trouble swallowing and voice change.   Eyes: Negative for photophobia, pain, discharge, redness, itching and visual disturbance.  Respiratory: Positive for shortness of breath. Negative for cough.   Cardiovascular: Negative for chest pain and palpitations.  Gastrointestinal: Negative for abdominal pain, diarrhea, nausea and vomiting.  Musculoskeletal: Positive for myalgias (Left upper arm). Negative for arthralgias, back pain, neck pain and neck stiffness.  Skin: Negative for color change and rash.  Neurological: Positive for weakness (generalized) and numbness. Negative for dizziness, facial asymmetry, light-headedness and headaches.     Physical Exam Triage Vital Signs ED Triage Vitals  Enc Vitals Group     BP --      Pulse Rate 11/22/17 1618 74     Resp 11/22/17 1618 16      Temp 11/22/17 1618 97.9 F (36.6 C)     Temp Source 11/22/17 1618 Oral     SpO2 11/22/17 1618 99 %     Weight 11/22/17 1619 225 lb (102.1 kg)     Height 11/22/17 1619 5\' 1"  (1.549 m)     Head Circumference --      Peak Flow --      Pain Score 11/22/17 1619 4     Pain Loc --      Pain Edu? --      Excl. in [place]? --    No data found.  Updated Vital Signs Pulse 74   Temp 97.9 F (36.6 C) (Oral)   Resp 16   Ht 5\' 1"  (1.549 m)   Wt 225 lb (102.1 kg)   SpO2 99%   BMI 42.51 kg/m   Visual Acuity Right Eye Distance:   Left Eye Distance:   Bilateral Distance:    Right Eye Near:   Left Eye Near:    Bilateral Near:     Physical Exam  Constitutional: She is oriented to person, place, and time. She appears well-developed and well-nourished. No distress.  Pt sitting on exam bed, appears well, NAD  HENT:  Head: Normocephalic and atraumatic.  Right Ear: Tympanic membrane normal.  Left Ear: Tympanic membrane normal.  Nose: Nose normal.  Mouth/Throat: Uvula is midline, oropharynx is clear and moist and mucous membranes are normal.  Eyes: Conjunctivae and EOM are normal. Pupils are equal, round, and reactive to light. Right eye exhibits no discharge. Left eye exhibits no discharge. No scleral icterus.  Neck: Normal range of motion. Neck supple.  No midline bone tenderness, no crepitus or step-offs.   Cardiovascular: Normal rate and regular rhythm.  Pulmonary/Chest: Effort normal and breath sounds normal. No stridor. No respiratory distress. She has no wheezes. She has no rales. She exhibits no tenderness.  Musculoskeletal: Normal range of motion.  Full ROM upper and lower extremities with 5/5 strength.   Neurological: She is alert and oriented to person, place, and time.  CN II-XII in tact. Speech is clear. Alert to person, place and time. Normal finger to nose coordination. Negative pronator drift. Normal gait.    Skin: Skin is warm and dry. She is not diaphoretic.  Psychiatric:  She has a normal mood and affect. Her behavior is normal.  Nursing note and vitals reviewed.    UC Treatments / Results  Labs (all labs ordered are listed, but only abnormal results are displayed) Labs Reviewed - No data to display  EKG  EKG Interpretation None       Radiology No results found.  Procedures Procedures (including critical care time)  Medications Ordered in UC Medications - No data to display   Initial Impression / Assessment and Plan / UC Course  I have reviewed the triage vital signs and the nursing notes.  Pertinent labs & imaging results that were available during my care of the patient were reviewed by me and considered in my medical decision making (see chart for details).     Doubt CVA, CAD or other emergent process taking place at this time. No evidence of facial droop but vague description of Left sided "numbness" with reported twitching, question early Bell's Palsy. Left upper arm pain/burning- no rash but question early shingles due to vague reports of fatigue today w/o URI symptoms.  Reassured pt of normal neuro exam today.  Discussed red flag symptoms that warrant call to 911 or have family/friend bring her to closest ED  Offered prednisone to help with intermittent Left hand numbness. She has tried before but does not like taking prednisone.  F/u with PCP later this week if needed.   Final Clinical Impressions(s) / UC Diagnoses   Final diagnoses:  Paresthesia  Fatigue, unspecified type  Left upper arm pain    ED Discharge Orders    None       Controlled Substance Prescriptions [place] Controlled Substance Registry consulted? Not Applicable   Tyrell Antonio 11/22/17 1732

## 2017-11-22 NOTE — ED Triage Notes (Signed)
Patient presents to the [facility] with C/O numbness intermittently times 3 months. She drives a bus as an occupation. Today she has felt some numbness in the left side of her face intermittently and since she woke this morning she C/O a twitching in the left eye. Alert and Oriented follows commands, no weakness of her extremities.

## 2018-02-10 ENCOUNTER — Other Ambulatory Visit: Payer: Self-pay

## 2018-02-10 ENCOUNTER — Emergency Department (INDEPENDENT_AMBULATORY_CARE_PROVIDER_SITE_OTHER)
Admission: EM | Admit: 2018-02-10 | Discharge: 2018-02-10 | Disposition: A | Discharge status: Home / Self Care | Payer: BC Managed Care – PPO | Source: Home / Self Care

## 2018-02-10 ENCOUNTER — Emergency Department (INDEPENDENT_AMBULATORY_CARE_PROVIDER_SITE_OTHER): Payer: BC Managed Care – PPO

## 2018-02-10 DIAGNOSIS — M25461 Effusion, right knee: Secondary | ICD-10-CM | POA: Diagnosis not present

## 2018-02-10 DIAGNOSIS — M1711 Unilateral primary osteoarthritis, right knee: Secondary | ICD-10-CM | POA: Diagnosis not present

## 2018-02-10 NOTE — Discharge Instructions (Signed)
See the Sports medicine doctors next door for recheck in 1 week

## 2018-02-10 NOTE — ED Notes (Signed)
Patient cannot drive with knee immobilizer. Stephanie [name] authorized changing to a hinged knee brace.

## 2018-02-10 NOTE — ED Triage Notes (Signed)
Walking on Sunday, felt a pop.  Yesterday and today, the pain has been worse, and more painful when walking.  Soreness is more behind the knee, and she feels like it will not support her at times.  Used ice yesterday.

## 2018-02-10 NOTE — ED Provider Notes (Signed)
[facility]    CSN: [id number] Arrival date & time: 02/10/18  1120     History   Chief Complaint Chief Complaint  Patient presents with  . Knee Pain    right    HPI Stephanie [name] is a 62 y.o. female.   The history is provided by the patient.  Knee Pain  Location:  Knee Time since incident:  3 days Injury: yes   Knee location:  R knee Pain details:    Quality:  Aching   Radiates to:  Does not radiate   Onset quality:  Gradual   Duration:  3 days   Timing:  Constant   Progression:  Worsening Chronicity:  New Relieved by:  Nothing Worsened by:  Nothing Ineffective treatments:  None tried Associated symptoms: no back pain   Pt reports she was walking on Sunday and had a pop in her knee.  Pt complains of swelling and pain   Past Medical History:  Diagnosis Date  . Diverticulitis 01/21/13  . Nephrolithiasis   . Plantar fasciitis     Patient Active Problem List   Diagnosis Date Noted  . Cervical pain (neck) 10/02/2015  . Achilles tendinitis of left lower extremity 09/06/2015  . Paresthesia and pain of right extremity 09/06/2015  . Splenic artery aneurysm ([place]) 01/21/2013  . Diverticulitis of colon without hemorrhage 01/21/2013  . Chest pain 01/11/2011  . COSTOCHONDRITIS 01/01/2011  . FATIGUE 01/01/2011  . DYSPNEA ON EXERTION 01/01/2011  . BENIGN NEOPLASM OF OTHER SPECIFIED SITES 12/18/2009    Past Surgical History:  Procedure Laterality Date  . CHOLECYSTECTOMY  1982  . TONSILECTOMY, ADENOIDECTOMY, BILATERAL MYRINGOTOMY AND TUBES     at age 59 approx.   . TUBAL LIGATION  1993    OB History   None      Home Medications    Prior to Admission medications   Medication Sig Start Date End Date Taking? Authorizing Provider  meloxicam (MOBIC) 15 MG tablet Take 1 tablet (15 mg total) by mouth daily. Take with food each morning 04/01/17   Kandra Nicolas, MD    Family History Family History  Problem Relation Age of Onset  . Coronary artery  disease Other        family hx of/ heart valve replacement  . Alcohol abuse Father   . Cancer Father   . Prostate cancer Father   . COPD Father   . Hyperlipidemia Father   . Cancer Mother   . Hyperlipidemia Mother   . Colon cancer Neg Hx     Social History Social History   Tobacco Use  . Smoking status: Former Smoker    Packs/day: 0.20    Years: 2.00    Pack years: 0.40    Types: Cigarettes    Last attempt to quit: 10/14/1976    Years since quitting: 41.3  . Smokeless tobacco: Never Used  Substance Use Topics  . Alcohol use: No  . Drug use: No     Allergies   Patient has no known allergies.   Review of Systems Review of Systems  Musculoskeletal: Negative for back pain.  All other systems reviewed and are negative.    Physical Exam Triage Vital Signs ED Triage Vitals  Enc Vitals Group     BP 02/10/18 1145 138/90     Pulse Rate 02/10/18 1145 61     Resp --      Temp 02/10/18 1145 (!) 97.5 F (36.4 C)  Temp Source 02/10/18 1145 Oral     SpO2 02/10/18 1145 98 %     Weight 02/10/18 1146 242 lb (109.8 kg)     Height 02/10/18 1146 5\' 1"  (1.549 m)     Head Circumference --      Peak Flow --      Pain Score 02/10/18 1146 5     Pain Loc --      Pain Edu? --      Excl. in [place]? --    No data found.  Updated Vital Signs BP 138/90 (BP Location: Right Arm)   Pulse 61   Temp (!) 97.5 F (36.4 C) (Oral)   Ht 5\' 1"  (1.549 m)   Wt 242 lb (109.8 kg)   SpO2 98%   BMI 45.73 kg/m   Visual Acuity Right Eye Distance:   Left Eye Distance:   Bilateral Distance:    Right Eye Near:   Left Eye Near:    Bilateral Near:     Physical Exam  Constitutional: She appears well-developed and well-nourished.  HENT:  Head: Normocephalic.  Musculoskeletal: She exhibits tenderness.  Swollen right knee,  Pain with range of motion,  No instability nv and ns intact  Neurological: She is alert.  Skin: Skin is warm.  Psychiatric: She has a normal mood and affect.  Nursing  note and vitals reviewed.    UC Treatments / Results  Labs (all labs ordered are listed, but only abnormal results are displayed) Labs Reviewed - No data to display  EKG None  Radiology Dg Knee Complete 4 Views Right  Result Date: 02/10/2018 CLINICAL DATA:  The patient fell a pop in her right knee while walking in her yard 2 days ago. Persistent right anterolateral and popliteal pain especially when straightening the knee and bearing weight. EXAM: RIGHT KNEE - COMPLETE 4+ VIEW COMPARISON:  Right knee series of April 01, 2017 FINDINGS: The bones are subjectively adequately mineralized. The joint spaces are well maintained. There is beaking of the tibial spines. Tiny spurs arise from the lateral articular margin of the lateral femoral condyle. Small spurs arise from the articular margins of the patella. There is a small suprapatellar effusion. IMPRESSION: There is no acute fracture nor dislocation of the right knee. There is a small suprapatellar effusion. Minimal osteoarthritic spurring as described without significant joint space loss. Electronically Signed   By: David  Martinique M.D.   On: 02/10/2018 12:51    Procedures Procedures (including critical care time)  Medications Ordered in UC Medications - No data to display  Initial Impression / Assessment and Plan / UC Course  I have reviewed the triage vital signs and the nursing notes.  Pertinent labs & imaging results that were available during my care of the patient were reviewed by me and considered in my medical decision making (see chart for details).     Pt counseled on srays.   I suspect meniscus injury.   Pt placed in brace.   Pt advised to follow up with Sports Med next door.  Final Clinical Impressions(s) / UC Diagnoses   Final diagnoses:  Effusion of right knee     Discharge Instructions     See the Sports medicine doctors next door for recheck in 1 week    ED Prescriptions    None     Controlled Substance  Prescriptions [place] Controlled Substance Registry consulted? Not Applicable  An After Visit Summary was printed and given to the patient.    [place],  Hollace Kinnier, PA-C 02/10/18 1532

## 2018-03-03 DIAGNOSIS — M25561 Pain in right knee: Secondary | ICD-10-CM | POA: Insufficient documentation

## 2018-03-03 DIAGNOSIS — M1711 Unilateral primary osteoarthritis, right knee: Secondary | ICD-10-CM | POA: Insufficient documentation

## 2018-07-01 LAB — HM MAMMOGRAPHY

## 2018-07-17 ENCOUNTER — Encounter: Payer: Self-pay | Admitting: Family Medicine

## 2018-09-15 ENCOUNTER — Encounter: Payer: Self-pay | Admitting: *Deleted

## 2018-09-15 ENCOUNTER — Emergency Department (INDEPENDENT_AMBULATORY_CARE_PROVIDER_SITE_OTHER)
Admission: EM | Admit: 2018-09-15 | Discharge: 2018-09-15 | Disposition: A | Discharge status: Home / Self Care | Payer: BC Managed Care – PPO | Source: Home / Self Care | Attending: Family Medicine | Admitting: Family Medicine

## 2018-09-15 ENCOUNTER — Emergency Department (INDEPENDENT_AMBULATORY_CARE_PROVIDER_SITE_OTHER): Payer: BC Managed Care – PPO

## 2018-09-15 DIAGNOSIS — M1612 Unilateral primary osteoarthritis, left hip: Secondary | ICD-10-CM

## 2018-09-15 DIAGNOSIS — M869 Osteomyelitis, unspecified: Secondary | ICD-10-CM

## 2018-09-15 DIAGNOSIS — M25552 Pain in left hip: Secondary | ICD-10-CM

## 2018-09-15 DIAGNOSIS — G8929 Other chronic pain: Secondary | ICD-10-CM

## 2018-09-15 DIAGNOSIS — M7062 Trochanteric bursitis, left hip: Secondary | ICD-10-CM | POA: Diagnosis not present

## 2018-09-15 DIAGNOSIS — M533 Sacrococcygeal disorders, not elsewhere classified: Secondary | ICD-10-CM

## 2018-09-15 MED ORDER — PREDNISONE 20 MG PO TABS: ORAL_TABLET | ORAL | 0 refills | Status: DC

## 2018-09-15 NOTE — ED Triage Notes (Signed)
Gradual onset left hip pain x1 month. Pain much worse recently. Denies injury. Pain increases with activity and while lying on left side. Pt states she has recently fallen and landed on that side but states pain began before the fall occurred.

## 2018-09-15 NOTE — ED Provider Notes (Signed)
[facility]    CSN: [id number] Arrival date & time: 09/15/18  1039     History   Chief Complaint Chief Complaint  Patient presents with  . Hip Pain    HPI Stephanie [name] is a 62 y.o. female.   Patient has had gradually increasing left hip pain for about a month.  She recalls no injury or change in physical activities.  She admits that she has fallen recently but her symptoms were present prior to the fall.  She has pain with activity and when supine on her left side.   She denies bowel or bladder dysfunction, and no saddle numbness.  She notes that ibuprofen helps somewhat.  The history is provided by the patient.  Hip Pain  This is a new problem. Episode onset: one month ago. The problem occurs constantly. The problem has been gradually worsening. Pertinent negatives include no abdominal pain and no headaches. The symptoms are aggravated by walking. Nothing relieves the symptoms. Treatments tried: Ibuprofen' The treatment provided moderate relief.    Past Medical History:  Diagnosis Date  . Diverticulitis 01/21/13  . Nephrolithiasis   . Plantar fasciitis     Patient Active Problem List   Diagnosis Date Noted  . Cervical pain (neck) 10/02/2015  . Achilles tendinitis of left lower extremity 09/06/2015  . Paresthesia and pain of right extremity 09/06/2015  . Splenic artery aneurysm ([place]) 01/21/2013  . Diverticulitis of colon without hemorrhage 01/21/2013  . Chest pain 01/11/2011  . COSTOCHONDRITIS 01/01/2011  . FATIGUE 01/01/2011  . DYSPNEA ON EXERTION 01/01/2011  . BENIGN NEOPLASM OF OTHER SPECIFIED SITES 12/18/2009    Past Surgical History:  Procedure Laterality Date  . CHOLECYSTECTOMY  1982  . TONSILECTOMY, ADENOIDECTOMY, BILATERAL MYRINGOTOMY AND TUBES     at age 13 approx.   . TUBAL LIGATION  1993    OB History   None      Home Medications    Prior to Admission medications   Medication Sig Start Date End Date Taking? Authorizing Provider    meloxicam (MOBIC) 15 MG tablet Take 1 tablet (15 mg total) by mouth daily. Take with food each morning 04/01/17   Kandra Nicolas, MD  predniSONE (DELTASONE) 20 MG tablet Take one tab by mouth twice daily for 4 days, then one daily. Take with food. 09/15/18   Kandra Nicolas, MD    Family History Family History  Problem Relation Age of Onset  . Coronary artery disease Other        family hx of/ heart valve replacement  . Alcohol abuse Father   . Cancer Father   . Prostate cancer Father   . COPD Father   . Hyperlipidemia Father   . Cancer Mother   . Hyperlipidemia Mother   . Colon cancer Neg Hx     Social History Social History   Tobacco Use  . Smoking status: Former Smoker    Packs/day: 0.20    Years: 2.00    Pack years: 0.40    Types: Cigarettes    Last attempt to quit: 10/14/1976    Years since quitting: 41.9  . Smokeless tobacco: Never Used  Substance Use Topics  . Alcohol use: No  . Drug use: No     Allergies   Patient has no known allergies.   Review of Systems Review of Systems  Constitutional: Negative.   HENT: Negative.   Eyes: Negative.   Respiratory: Negative.   Cardiovascular: Negative.   Gastrointestinal: Negative.  Negative for abdominal pain.  Genitourinary: Negative.   Musculoskeletal: Positive for back pain.  Skin: Negative.   Neurological: Negative for headaches.     Physical Exam Triage Vital Signs ED Triage Vitals  Enc Vitals Group     BP 09/15/18 1108 123/67     Pulse --      Resp 09/15/18 1108 16     Temp 09/15/18 1108 97.8 F (36.6 C)     Temp Source 09/15/18 1108 Oral     SpO2 09/15/18 1108 97 %     Weight 09/15/18 1109 230 lb (104.3 kg)     Height 09/15/18 1109 5\' 1"  (1.549 m)     Head Circumference --      Peak Flow --      Pain Score 09/15/18 1109 3     Pain Loc --      Pain Edu? --      Excl. in [place]? --    No data found.  Updated Vital Signs BP 123/67 (BP Location: Right Arm)   Temp 97.8 F (36.6 C) (Oral)    Resp 16   Ht 5\' 1"  (1.549 m)   Wt 104.3 kg   SpO2 97%   BMI 43.46 kg/m   Visual Acuity Right Eye Distance:   Left Eye Distance:   Bilateral Distance:    Right Eye Near:   Left Eye Near:    Bilateral Near:     Physical Exam  Constitutional: She appears well-developed and well-nourished. No distress.  HENT:  Head: Normocephalic.  Right Ear: External ear normal.  Left Ear: External ear normal.  Nose: Nose normal.  Mouth/Throat: Oropharynx is clear and moist.  Eyes: Pupils are equal, round, and reactive to light. Conjunctivae are normal.  Neck: Normal range of motion.  Cardiovascular: Normal heart sounds.  Pulmonary/Chest: Breath sounds normal.  Abdominal: There is no tenderness.  Musculoskeletal: She exhibits no edema.       Legs: Patient has distinct tenderness over symphysis pubis on the left.  Palpation there during resisted lateral adduction, and flexion of left hip, recreates pain.  Left hip reveals distinct tenderness over the greater trochanter.  Palpating the greater trochanter during resisted lateral abduction of the hip recreates her pain.  There is distinct tenderness to palpation over the left SI joint.    Neurological: She is alert.  Skin: Skin is warm and dry. No rash noted.  Nursing note and vitals reviewed.    UC Treatments / Results  Labs (all labs ordered are listed, but only abnormal results are displayed) Labs Reviewed - No data to display  EKG None  Radiology Dg Hip Unilat W Or Wo Pelvis 2-3 Views Left  Result Date: 09/15/2018 CLINICAL DATA:  Left-sided hip pain for 1 month, no known injury, initial encounter EXAM: DG HIP (WITH OR WITHOUT PELVIS) 2-3V LEFT COMPARISON:  None. FINDINGS: Pelvic ring is intact. Mild degenerative changes of the hip joints are noted bilaterally. No acute fracture or dislocation is seen. No soft tissue abnormality is noted. IMPRESSION: Degenerative change without acute abnormality. Electronically Signed   By: Inez Catalina M.D.   On: 09/15/2018 12:55    Procedures Procedures (including critical care time)  Medications Ordered in UC Medications - No data to display  Initial Impression / Assessment and Plan / UC Course  I have reviewed the triage vital signs and the nursing notes.  Pertinent labs & imaging results that were available during my care of the patient were reviewed by  me and considered in my medical decision making (see chart for details).    Begin prednisone burst/taper. Followup with Dr. Georgina Snell 09/22/18 at [facility] for further management.  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  May take Tylenol for pain.  Begin range of motion and stretching exercises as tolerated.    ED Prescriptions    Medication Sig Dispense Auth. Provider   predniSONE (DELTASONE) 20 MG tablet Take one tab by mouth twice daily for 4 days, then one daily. Take with food. 12 tablet Kandra Nicolas, MD        Kandra Nicolas, MD 09/17/18 [phone]

## 2018-09-15 NOTE — Discharge Instructions (Addendum)
Apply ice pack for 20 to 30 minutes, 3 to 4 times daily  Continue until pain decreases.  May take Tylenol for pain.  Begin range of motion and stretching exercises as tolerated.

## 2018-09-16 ENCOUNTER — Telehealth: Payer: Self-pay | Admitting: Family Medicine

## 2018-09-16 ENCOUNTER — Encounter: Payer: Self-pay | Admitting: Family Medicine

## 2018-09-16 NOTE — Telephone Encounter (Signed)
Error

## 2018-09-16 NOTE — Telephone Encounter (Signed)
Absolutely. She can come in today at 11:30 if she would like,

## 2018-09-16 NOTE — Telephone Encounter (Signed)
Patient states she is sorry she could not make it in today due to work but has scheduled an appointment for 12/23.

## 2018-09-16 NOTE — Telephone Encounter (Signed)
Patient called in wanting to know if she can re-establish care with Dr.Metheney. Patient was first seen in 2012 and then had an appointment to re-establish on 06/10/16 but canceled. Please advise.

## 2018-09-22 ENCOUNTER — Encounter: Payer: Self-pay | Admitting: Family Medicine

## 2018-09-22 ENCOUNTER — Ambulatory Visit (INDEPENDENT_AMBULATORY_CARE_PROVIDER_SITE_OTHER): Payer: BC Managed Care – PPO | Admitting: Family Medicine

## 2018-09-22 VITALS — BP 122/60 | HR 63 | Ht 61.0 in | Wt 238.0 lb

## 2018-09-22 DIAGNOSIS — G8929 Other chronic pain: Secondary | ICD-10-CM | POA: Diagnosis not present

## 2018-09-22 DIAGNOSIS — M7062 Trochanteric bursitis, left hip: Secondary | ICD-10-CM

## 2018-09-22 DIAGNOSIS — M545 Low back pain: Secondary | ICD-10-CM

## 2018-09-22 NOTE — Patient Instructions (Addendum)
Thank you for coming in today. Work on the side leg raises  Do about 30 reps 2x daily.  Do the standing stretch Do the laying down knee across body stretch after exercises.   Attend PT.  If not better I can do a shot.   OK to use muscle rubs like icy hot.   Recheck with me in 6 weeks if not better.

## 2018-09-22 NOTE — Progress Notes (Signed)
Stephanie [name] is a 62 y.o. female who presents to [facility] today for left lateral hip pain.  Stephanie [name] has had pain in the left lateral hip now for over a month.  She was seen by Dr. Assunta Found in urgent care on December 3rd.  X-rays at the time showed mild degenerative changes in her left hip joint.  She was thought to have trochanteric bursitis.  She was treated with oral prednisone and some home exercises.  She is here today for recheck and reevaluation.  She notes pain mostly in her lateral hip and in her low back.  Pain is worse with activity such as standing from a seated position and climbing stairs.  She also notes pain at that time especially when she lays on her left side.  She denies pain radiating below the level of her knee.  She denies weakness or numbness or injury.  No bowel bladder dysfunction.  She was prescribed prednisone which she notes has been quite helpful.  She notes her pain is diminished and today is her last day of the prednisone course.  She has been taking ibuprofen as well which has been helpful.    ROS:  As above  Exam:  BP 122/60   Pulse 63   Ht 5\' 1"  (1.549 m)   Wt 238 lb (108 kg)   BMI 44.97 kg/m  General: Well Developed, well nourished, and in no acute distress.  Neuro/Psych: Alert and oriented x3, extra-ocular muscles intact, able to move all 4 extremities, sensation grossly intact. Skin: Warm and dry, no rashes noted.  Respiratory: Not using accessory muscles, speaking in full sentences, trachea midline.  Cardiovascular: Pulses palpable, no extremity edema. Abdomen: Does not appear distended. MSK:  Left hip normal-appearing normal motion.  Some pain with flexion and internal rotation. Tender to palpation greater trochanter. Hip abduction strength diminished 4/5 with pain. Hip external rotation strength intact and nontender.  Internal rotation strength and adduction normal strength and nontender.  Right hip  normal-appearing normal motion nontender normal strength.    Lab and Radiology Results Dg Hip Unilat W Or Wo Pelvis 2-3 Views Left  Result Date: 09/15/2018 CLINICAL DATA:  Left-sided hip pain for 1 month, no known injury, initial encounter EXAM: DG HIP (WITH OR WITHOUT PELVIS) 2-3V LEFT COMPARISON:  None. FINDINGS: Pelvic ring is intact. Mild degenerative changes of the hip joints are noted bilaterally. No acute fracture or dislocation is seen. No soft tissue abnormality is noted. IMPRESSION: Degenerative change without acute abnormality. Electronically Signed   By: Inez Catalina M.D.   On: 09/15/2018 12:55   I personally (independently) visualized and performed the interpretation of the images attached in this note.     Assessment and Plan: 62 y.o. female with  Left lateral hip pain and low back pain due to trochanteric bursitis.  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  Plan for home exercise program focused on hip abduction strengthening.  Additionally will proceed with formal physical therapy.  She she would likely benefit from iontophoresis.  Recheck with me in 6 weeks or sooner if needed.  Reasonable to proceed with injection if pain is not well controlled enough to proceed with exercises before her follow-up visit in 6 weeks.    Orders Placed This Encounter  Procedures  . Ambulatory referral to Physical Therapy  Referral Priority:   Routine    Referral Type:   Physical Medicine    Referral Reason:   Specialty Services Required    Requested Specialty:   Physical Therapy   No orders of the defined types were placed in this encounter.   Historical information moved to improve visibility of documentation.  Past Medical History:  Diagnosis Date  . Diverticulitis 01/21/13  . Nephrolithiasis   . Plantar fasciitis    Past Surgical History:  Procedure  Laterality Date  . CHOLECYSTECTOMY  1982  . TONSILECTOMY, ADENOIDECTOMY, BILATERAL MYRINGOTOMY AND TUBES     at age 12 approx.   . TUBAL LIGATION  1993   Social History   Tobacco Use  . Smoking status: Former Smoker    Packs/day: 0.20    Years: 2.00    Pack years: 0.40    Types: Cigarettes    Last attempt to quit: 10/14/1976    Years since quitting: 41.9  . Smokeless tobacco: Never Used  Substance Use Topics  . Alcohol use: No   family history includes Alcohol abuse in her father; COPD in her father; Cancer in her father and mother; Coronary artery disease in her other; Hyperlipidemia in her father and mother; Prostate cancer in her father.  Medications: No current outpatient medications on file.   No current facility-administered medications for this visit.    No Known Allergies    Discussed warning signs or symptoms. Please see discharge instructions. Patient expresses understanding.

## 2018-09-24 ENCOUNTER — Ambulatory Visit (INDEPENDENT_AMBULATORY_CARE_PROVIDER_SITE_OTHER): Payer: BC Managed Care – PPO | Admitting: Physical Therapy

## 2018-09-24 ENCOUNTER — Other Ambulatory Visit: Payer: Self-pay

## 2018-09-24 ENCOUNTER — Encounter: Payer: Self-pay | Admitting: Physical Therapy

## 2018-09-24 DIAGNOSIS — R29898 Other symptoms and signs involving the musculoskeletal system: Secondary | ICD-10-CM

## 2018-09-24 DIAGNOSIS — R252 Cramp and spasm: Secondary | ICD-10-CM

## 2018-09-24 DIAGNOSIS — M25552 Pain in left hip: Secondary | ICD-10-CM

## 2018-09-24 NOTE — Patient Instructions (Addendum)
IONTOPHORESIS PATIENT PRECAUTIONS & CONTRAINDICATIONS:  . Redness under one or both electrodes can occur.  This characterized by a uniform redness that usually disappears within 12 hours of treatment. . Small pinhead size blisters may result in response to the drug.  Contact your physician if the problem persists more than 24 hours. . On rare occasions, iontophoresis therapy can result in temporary skin reactions such as rash, inflammation, irritation or burns.  The skin reactions may be the result of individual sensitivity to the ionic solution used, the condition of the skin at the start of treatment, reaction to the materials in the electrodes, allergies or sensitivity to dexamethasone, or a poor connection between the patch and your skin.  Discontinue using iontophoresis if you have any of these reactions and report to your therapist. . Remove the Patch or electrodes if you have any undue sensation of pain or burning during the treatment and report discomfort to your therapist. . Tell your Therapist if you have had known adverse reactions to the application of electrical current. . If using the Patch, the LED light will turn off when treatment is complete and the patch can be removed.  Approximate treatment time is 1-3 hours.  Remove the patch when light goes off or after 6 hours. . The Patch can be worn during normal activity, however excessive motion where the electrodes have been placed can cause poor contact between the skin and the electrode or uneven electrical current resulting in greater risk of skin irritation. Marland Kitchen Keep out of the reach of children.   . DO NOT use if you have a cardiac pacemaker or any other electrically sensitive implanted device. . DO NOT use if you have a known sensitivity to dexamethasone. . DO NOT use during Magnetic Resonance Imaging (MRI). . DO NOT use over broken or compromised skin (e.g. sunburn, cuts, or acne) due to the increased risk of skin reaction. . DO  NOT SHAVE over the area to be treated:  To establish good contact between the Patch and the skin, excessive hair may be clipped. . DO NOT place the Patch or electrodes on or over your eyes, directly over your heart, or brain. . DO NOT reuse the Patch or electrodes as this may cause burns to occur.   Access Code: [email or web address]  URL: [email or web address]  Date: 09/24/2018  Prepared by: Faustino Congress   Exercises  Standing ITB Stretch - 3 reps - 1 sets - 30 sec hold - 1x daily - 7x weekly  Seated Figure 4 Piriformis Stretch - 3 reps - 1 sets - 30 sec hold - 1x daily - 7x weekly  Sidelying Hip Abduction - 2 sets - 10 reps - 2x daily - 7x weekly

## 2018-09-24 NOTE — Therapy (Signed)
[place], [place], [address] Phone: [phone]   Fax:  [phone]  Physical Therapy Evaluation  Patient Details  Name: Stephanie [name] MRN: [id number] Date of Birth: Feb 06, 1956 Referring Provider (PT): Gregor Hams, MD   Encounter Date: 09/24/2018  PT End of Session - 09/24/18 1301    Visit Number  1    Number of Visits  12    Date for PT Re-Evaluation  11/05/18    Authorization Type  BCBS    PT Start Time  1155    PT Stop Time  1238    PT Time Calculation (min)  43 min    Activity Tolerance  Patient tolerated treatment well    Behavior During Therapy  [facility] for tasks assessed/performed       Past Medical History:  Diagnosis Date  . Diverticulitis 01/21/13  . Nephrolithiasis   . Plantar fasciitis     Past Surgical History:  Procedure Laterality Date  . CHOLECYSTECTOMY  1982  . TONSILECTOMY, ADENOIDECTOMY, BILATERAL MYRINGOTOMY AND TUBES     at age 41 approx.   . TUBAL LIGATION  1993    There were no vitals filed for this visit.   Subjective Assessment - 09/24/18 1157    Subjective  Pt is a 62 y/o female who presents to [facility] for Lt lateral hip pain which began initially with difficulty lying on Lt side and trouble sleeping x 6-8 weeks ago.  Pt without known injury, but did report slipping on wet step and fell (pain prior to fall).  Pt also with anterior hip pain.      Pertinent History  Lt hip OA,     Limitations  Sitting    How long can you sit comfortably?  5-10 min    Diagnostic tests  xrays: OA    Patient Stated Goals  improve pain, not sure "Dr. Georgina Snell told me I needed to come at least twice."    Currently in Pain?  Yes    Pain Score  3    up to 7/10; at best 3/10   Pain Location  Hip    Pain Orientation  Left    Pain Descriptors / Indicators  Stabbing;Aching;Sharp    Pain Type  Acute pain    Pain Onset  More than a month ago    Pain Frequency  Constant    Aggravating  Factors   lying on Lt side, sidelying hip abduction exercise    Pain Relieving Factors  ibuprofen, heat, ice, rest         [facility] PT Assessment - 09/24/18 1205      Assessment   Medical Diagnosis  M70.62 (ICD-10-CM) - Trochanteric bursitis of left hip    Referring Provider (PT)  Gregor Hams, MD    Onset Date/Surgical Date  --   6-8 weeks ago   Hand Dominance  Right    Next MD Visit  6 weeks; not scheduled    Prior Therapy  at this clinic previously for low back      Precautions   Precautions  None      Restrictions   Weight Bearing Restrictions  No      Balance Screen   Has the patient fallen in the past 6 months  Yes    How many times?  1    Has the patient had a decrease in activity level because of a fear of falling?   No  Is the patient reluctant to leave their home because of a fear of falling?   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)    Leisure  camping, reading, no regular exercise      Cognition   Overall Cognitive Status  Within Functional Limits for tasks assessed      Observation/Other Assessments   Focus on Therapeutic Outcomes (FOTO)   68 (32% limited; predicted 28% limited)      Posture/Postural Control   Posture/Postural Control  Postural limitations    Postural Limitations  Rounded Shoulders;Forward head      ROM / Strength   AROM / PROM / Strength  AROM;Strength      Strength   Strength Assessment Site  Hip    Right/Left Hip  Left    Left Hip Flexion  4/5    Left  Hip Extension  3+/5    Left Hip External Rotation  3+/5    Left Hip Internal Rotation  4/5    Left Hip ABduction  3+/5      Flexibility   Soft Tissue Assessment /Muscle Length  yes    ITB  tightness on Lt    Piriformis  tightness on Lt      Palpation   Palpation comment  point tenderness greater trochanter bursa                Objective measurements completed on examination: See above findings.      [facility] Adult PT Treatment/Exercise - 09/24/18 1205      Exercises   Exercises  Knee/Hip      Knee/Hip Exercises: Stretches   ITB Stretch  Left;1 rep;30 seconds    ITB Stretch Limitations  standing    Piriformis Stretch  Left;1 rep;30 seconds    Piriformis Stretch Limitations  sitting (attempted supine but increased discomfort)      Knee/Hip Exercises: Sidelying   Hip ABduction  Left;5 reps    Hip ABduction Limitations  cues for proper technique      Modalities   Modalities  Iontophoresis      Iontophoresis   Type of Iontophoresis  Dexamethasone    Location  Lt greater trochanter    Dose  1.0 cc    Time  6 hour patch             PT Education - 09/24/18 1301    Education Details  HEP, ionto    Person(s) Educated  Patient    Methods  Explanation;Handout    Comprehension  Verbalized understanding          PT Long Term Goals - 09/24/18 1306      PT LONG TERM GOAL #1   Title  independent with HEP    Status  New    Target Date  11/05/18      PT LONG TERM GOAL #2   Title  report 50% improvement in sleeping on Lt side for improved function    Status  New    Target Date  11/05/18      PT LONG TERM GOAL #3   Title  FOTO score improved to </= 28% limited for improved function    Status  New    Target Date  11/05/18      PT LONG TERM GOAL #4   Title  demonstrate at least 4/5 Lt hip strength for improved function    Status  New    Target Date  11/05/18      PT LONG TERM GOAL #5   Title  report ability to sit at least 1 hour without increase in  pain for improved work responsibilities    Status  New    Target Date  11/05/18             Plan - 09/24/18 1250    Clinical Impression Statement  Pt is a 62 y/o female who presents to [facility] for Lt hip pain x 6-8 weeks with unknown onset.  Pt demonstrates decreased strength and flexibility as well as pain and tightness in Lt hip.  Pt will benefit from PT to address deficits listed.    History and Personal Factors relevant to plan of care:  obesity,     Clinical Presentation  Stable    Clinical Decision Making  Low    Rehab Potential  Good    PT Frequency  2x / week   recommend 2x/wk; pt requesting 1x/wk   PT Duration  6 weeks    PT Treatment/Interventions  ADLs/Self Care Home Management;Cryotherapy;Ultrasound;Moist Heat;Iontophoresis 4mg /ml Dexamethasone;Electrical Stimulation;Functional mobility training;Neuromuscular re-education;Therapeutic exercise;Therapeutic activities;Patient/family education;Manual techniques;Dry needling;Taping    PT Next Visit Plan  review HEP and add additional exercises, manual/modalities PRN, may benefit from taping; assess response to ionto    PT Home Exercise Plan  Access Code: [email or web address]    Consulted and Agree with Plan of Care  Patient       Patient will benefit from skilled therapeutic intervention in order to improve the following deficits and impairments:  Decreased mobility, Increased muscle spasms, Increased fascial restricitons, Impaired flexibility, Decreased strength  Visit Diagnosis: Pain in left hip - Plan: PT plan of care cert/re-cert  Cramp and spasm - Plan: PT plan of care cert/re-cert  Other symptoms and signs involving the musculoskeletal system - Plan: PT plan of care cert/re-cert     Problem List Patient Active Problem List   Diagnosis Date Noted  . Pain in right knee 03/03/2018  . Cervical pain (neck) 10/02/2015  . Achilles tendinitis of left lower extremity 09/06/2015  . Paresthesia and pain of right extremity 09/06/2015   . Splenic artery aneurysm ([place]) 01/21/2013  . Diverticulitis of colon without hemorrhage 01/21/2013  . Chest pain 01/11/2011  . COSTOCHONDRITIS 01/01/2011  . FATIGUE 01/01/2011  . DYSPNEA ON EXERTION 01/01/2011  . BENIGN NEOPLASM OF OTHER SPECIFIED SITES 12/18/2009      Laureen Abrahams, PT, DPT 09/24/18 1:11 PM     [facility] [place], [place], [address] Phone: [phone]   Fax:  [phone]  Name: Stephanie [name] MRN: [id number] Date of Birth: 28-Feb-1956

## 2018-10-01 ENCOUNTER — Ambulatory Visit (INDEPENDENT_AMBULATORY_CARE_PROVIDER_SITE_OTHER): Payer: BC Managed Care – PPO | Admitting: Physical Therapy

## 2018-10-01 ENCOUNTER — Encounter: Payer: Self-pay | Admitting: Physical Therapy

## 2018-10-01 DIAGNOSIS — R29898 Other symptoms and signs involving the musculoskeletal system: Secondary | ICD-10-CM | POA: Diagnosis not present

## 2018-10-01 DIAGNOSIS — M25552 Pain in left hip: Secondary | ICD-10-CM

## 2018-10-01 DIAGNOSIS — R252 Cramp and spasm: Secondary | ICD-10-CM | POA: Diagnosis not present

## 2018-10-01 NOTE — Therapy (Addendum)
[place], [place], [address] Phone: [phone]   Fax:  [phone]  Physical Therapy Treatment/Discharge  Patient Details  Name: Stephanie [name] MRN: [id number] Date of Birth: 09-05-1956 Referring Provider (PT): Gregor Hams, MD   Encounter Date: 10/01/2018  PT End of Session - 10/01/18 1019    Visit Number  2    Number of Visits  12    Date for PT Re-Evaluation  11/05/18    Authorization Type  BCBS    PT Start Time  1019    PT Stop Time  1100    PT Time Calculation (min)  41 min       Past Medical History:  Diagnosis Date  . Diverticulitis 01/21/13  . Nephrolithiasis   . Plantar fasciitis     Past Surgical History:  Procedure Laterality Date  . CHOLECYSTECTOMY  1982  . TONSILECTOMY, ADENOIDECTOMY, BILATERAL MYRINGOTOMY AND TUBES     at age 62 approx.   . TUBAL LIGATION  1993    There were no vitals filed for this visit.  Subjective Assessment - 10/01/18 1021    Subjective  Pt reports the ionto patch fell off on way to her car. She told Dr she would give therapy 2 visits.  "The leg lifts are really irritating my hip".  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     Patient Stated Goals  improve pain, not sure "Dr. Georgina Snell told me I needed to come at least twice."    Currently in Pain?  Yes    Pain Score  3     Pain Location  Hip    Pain Orientation  Left    Pain Descriptors / Indicators  Aching;Sharp    Aggravating Factors   lying on Lt side, sidelying hip abduction exercise    Pain Relieving Factors  heat, medicine.          [facility] PT Assessment - 10/01/18 0001      Assessment   Medical Diagnosis  M70.62 (ICD-10-CM) - Trochanteric bursitis of left hip    Referring Provider (PT)  Gregor Hams, MD    Onset Date/Surgical Date  --   6-8 weeks ago   Hand Dominance  Right    Next MD Visit  6 weeks; not scheduled    Prior Therapy  at this clinic previously for low back       [facility]  Adult PT Treatment/Exercise - 10/01/18 0001      Self-Care   Self-Care  Other Self-Care Comments    Other Self-Care Comments   Pt educated on musculature of hip and how it relates to her pain/ mobility, along with rationale for stretches.  Pt educated on self massage ( MFR/TPR) to Lt posterior and anterior hip to decrease fascial tightness and pain. Pt verbalized understanding and returned demo with cues.       Knee/Hip Exercises: Stretches   Passive Hamstring Stretch  Left;Right;2 reps;20 seconds   seated   Quad Stretch  Left;2 reps;20 seconds   prone   Hip Flexor Stretch  Right;20 seconds;4 reps    Hip Flexor Stretch Limitations  trial in sitting, supine, and standing (preferred.)    Piriformis Stretch  Left;3 reps;Right;1 rep;30 seconds    Piriformis Stretch Limitations  seated, bringing knee to opp shoulder.       Modalities   Modalities  Iontophoresis      Iontophoresis   Type of Iontophoresis  Dexamethasone  Location  Lt greater trochanter    Dose  1.0 cc    Time  8 hr patch; 120 mA             PT Education - 10/01/18 1255    Education Details  HEP    Person(s) Educated  Patient    Methods  Explanation    Comprehension  Verbalized understanding          PT Long Term Goals - 09/24/18 1306      PT LONG TERM GOAL #1   Title  independent with HEP    Status  New    Target Date  11/05/18      PT LONG TERM GOAL #2   Title  report 50% improvement in sleeping on Lt side for improved function    Status  New    Target Date  11/05/18      PT LONG TERM GOAL #3   Title  FOTO score improved to </= 28% limited for improved function    Status  New    Target Date  11/05/18      PT LONG TERM GOAL #4   Title  demonstrate at least 4/5 Lt hip strength for improved function    Status  New    Target Date  11/05/18      PT LONG TERM GOAL #5   Title  report ability to sit at least 1 hour without increase in pain for improved work responsibilities    Status  New     Target Date  11/05/18            Plan - 10/01/18 1250    Clinical Impression Statement  Pt unable to tolerate previously issued sidelying hip abduction.  Today's focus was on giving pt comprehensive stretches to address Lt hip tightness and self care with self-massage.  Pt was unable to tolerate supine hip flexor stretch, but was able tolerate all other stretches.  Pt requests to hold therapy while she works on HEP/ self care and verbalizes interest in returning to therapy if ionto patch is helpful. No new goals met; only 2nd visit.     Rehab Potential  Good    PT Frequency  2x / week    PT Duration  6 weeks    PT Treatment/Interventions  ADLs/Self Care Home Management;Cryotherapy;Ultrasound;Moist Heat;Iontophoresis '4mg'$ /ml Dexamethasone;Electrical Stimulation;Functional mobility training;Neuromuscular re-education;Therapeutic exercise;Therapeutic activities;Patient/family education;Manual techniques;Dry needling;Taping    PT Next Visit Plan  assess response of ionto; manual therapy/ DN to Lt hip; progress HEP as tolerated.     PT Home Exercise Plan  Access Code: [email or web address].  Added standing hip flexor stretch, seated hamstring stretch, prone quad stretch, reviewed seated piriformis.  (pt declined handout).     Consulted and Agree with Plan of Care  Patient       Patient will benefit from skilled therapeutic intervention in order to improve the following deficits and impairments:  Decreased mobility, Increased muscle spasms, Increased fascial restricitons, Impaired flexibility, Decreased strength  Visit Diagnosis: Pain in left hip  Cramp and spasm  Other symptoms and signs involving the musculoskeletal system     Problem List Patient Active Problem List   Diagnosis Date Noted  . Pain in right knee 03/03/2018  . Cervical pain (neck) 10/02/2015  . Achilles tendinitis of left lower extremity 09/06/2015  . Paresthesia and pain of right extremity 09/06/2015  . Splenic artery aneurysm  ([place]) 01/21/2013  . Diverticulitis of colon without hemorrhage 01/21/2013  . Chest  pain 01/11/2011  . COSTOCHONDRITIS 01/01/2011  . FATIGUE 01/01/2011  . DYSPNEA ON EXERTION 01/01/2011  . BENIGN NEOPLASM OF OTHER SPECIFIED SITES 12/18/2009   Kerin Perna, PTA 10/01/18 1:04 PM  [place], [place], [address] Phone: [phone]   Fax:  [phone]  Name: Stephanie [name] MRN: [id number] Date of Birth: 1956-08-04     PHYSICAL THERAPY DISCHARGE SUMMARY  Visits from Start of Care: 2  Current functional level related to goals / functional outcomes: See above   Remaining deficits: See above   Education / Equipment: HEP  Plan: Patient agrees to discharge.  Patient goals were not met. Patient is being discharged due to not returning since the last visit.  ?????     Laureen Abrahams, PT, DPT 11/05/18 2:12 PM  [facility] [place], [place] [address]  [phone] (office) [phone] (fax)

## 2018-10-05 ENCOUNTER — Encounter: Payer: BC Managed Care – PPO | Admitting: Physical Therapy

## 2018-10-05 ENCOUNTER — Ambulatory Visit: Payer: BC Managed Care – PPO | Admitting: Family Medicine

## 2018-10-12 ENCOUNTER — Ambulatory Visit (INDEPENDENT_AMBULATORY_CARE_PROVIDER_SITE_OTHER): Payer: BC Managed Care – PPO | Admitting: Family Medicine

## 2018-10-12 ENCOUNTER — Encounter: Payer: Self-pay | Admitting: Family Medicine

## 2018-10-12 ENCOUNTER — Encounter: Payer: BC Managed Care – PPO | Admitting: Physical Therapy

## 2018-10-12 VITALS — BP 109/79 | HR 78 | Ht 61.0 in | Wt 234.0 lb

## 2018-10-12 DIAGNOSIS — Z13220 Encounter for screening for lipoid disorders: Secondary | ICD-10-CM

## 2018-10-12 DIAGNOSIS — I781 Nevus, non-neoplastic: Secondary | ICD-10-CM

## 2018-10-12 DIAGNOSIS — D1721 Benign lipomatous neoplasm of skin and subcutaneous tissue of right arm: Secondary | ICD-10-CM

## 2018-10-12 DIAGNOSIS — D1801 Hemangioma of skin and subcutaneous tissue: Secondary | ICD-10-CM

## 2018-10-12 NOTE — Patient Instructions (Signed)
Can schedule your pap smear down the hall Can schedule a skin tag removal anytime.

## 2018-10-12 NOTE — Progress Notes (Signed)
New Patient Office Visit  Subjective:  Patient ID: Stephanie [name], female    DOB: [birth year]  Age: 62 y.o. MRN: [id number]  CC:  Chief Complaint  Patient presents with  . Establish Care    ? cyst on inside of R arm    HPI Stephanie [name] presents for cyst on her right arm.  She is here to re-establish care.  She was a patient of mine back in 2012. She is seeing Dr. Georgina Snell for her MSK concerns.   She has not had blood work and or a Pap smear in well over 10 years.  She is interested in establishing with an OB/GYN as well.  Also mention that she had had a cough for a couple of weeks.  But she feels like it is been getting better.  Couple of family members have been sick as well.  Past Medical History:  Diagnosis Date  . Diverticulitis 01/21/13  . Nephrolithiasis   . Plantar fasciitis     Past Surgical History:  Procedure Laterality Date  . CHOLECYSTECTOMY  1982  . TONSILECTOMY, ADENOIDECTOMY, BILATERAL MYRINGOTOMY AND TUBES     at age 76 approx.   . TUBAL LIGATION  1993    Family History  Problem Relation Age of Onset  . Coronary artery disease Other        family hx of/ heart valve replacement  . Alcohol abuse Father   . Cancer Father   . Prostate cancer Father   . COPD Father   . Hyperlipidemia Father   . Cancer Mother   . Hyperlipidemia Mother   . Colon cancer Neg Hx     Social History   Socioeconomic History  . Marital status: Married    Spouse name: Canaan Prue  . Number of children: 2  . Years of education: HS  . Highest education level: Not on file  Occupational History  . Occupation: BUS DRIVER     Employer: [place]: [employer]  Social Needs  . Financial resource strain: Not on file  . Food insecurity:    Worry: Not on file    Inability: Not on file  . Transportation needs:    Medical: Not on file    Non-medical: Not on file  Tobacco Use  . Smoking status: Former Smoker    Packs/day: 0.20    Years:  2.00    Pack years: 0.40    Types: Cigarettes    Last attempt to quit: 10/14/1976    Years since quitting: 42.0  . Smokeless tobacco: Never Used  Substance and Sexual Activity  . Alcohol use: No  . Drug use: No  . Sexual activity: Yes  Lifestyle  . Physical activity:    Days per week: Not on file    Minutes per session: Not on file  . Stress: Not on file  Relationships  . Social connections:    Talks on phone: Not on file    Gets together: Not on file    Attends religious service: Not on file    Active member of club or organization: Not on file    Attends meetings of clubs or organizations: Not on file    Relationship status: Not on file  . Intimate partner violence:    Fear of current or ex partner: Not on file    Emotionally abused: Not on file    Physically abused: Not on file    Forced sexual  activity: Not on file  Other Topics Concern  . Not on file  Social History Narrative   2-4 caffeinated drinks ( Mtn Dew)  per day. No regular exercise.       ROS Review of Systems  Constitutional: Negative for diaphoresis, fever and unexpected weight change.  HENT: Negative for hearing loss, postnasal drip, sneezing and tinnitus.   Eyes: Negative for visual disturbance.  Respiratory: Positive for cough. Negative for wheezing.   Cardiovascular: Negative for chest pain and palpitations.  Genitourinary: Negative for vaginal bleeding and vaginal discharge.  Musculoskeletal: Positive for arthralgias and myalgias.  Neurological: Negative for headaches.  Hematological: Negative for adenopathy. Does not bruise/bleed easily.    Objective:   Today's Vitals: BP 109/79   Pulse 78   Ht 5\' 1"  (1.549 m)   Wt 234 lb (106.1 kg)   SpO2 99%   BMI 44.21 kg/m   Physical Exam Constitutional:      Appearance: She is well-developed.  HENT:     Head: Normocephalic and atraumatic.     Right Ear: External ear normal.     Left Ear: External ear normal.     Nose: Nose normal.  Eyes:      Conjunctiva/sclera: Conjunctivae normal.     Pupils: Pupils are equal, round, and reactive to light.  Neck:     Musculoskeletal: Neck supple.     Thyroid: No thyromegaly.  Cardiovascular:     Rate and Rhythm: Normal rate and regular rhythm.     Heart sounds: Normal heart sounds.  Pulmonary:     Effort: Pulmonary effort is normal.     Breath sounds: Normal breath sounds. No wheezing.  Lymphadenopathy:     Cervical: No cervical adenopathy.  Skin:    General: Skin is warm and dry.     Comments: Cherry angiomas on her neck and arms.  She also has a couple of skin tags on her neck as well.  She has a lipoma on her right upper inner arm.    Neurological:     Mental Status: She is alert and oriented to person, place, and time.  Psychiatric:        Behavior: Behavior normal.     Assessment & Plan:   Problem List Items Addressed This Visit    None    Visit Diagnoses    Screening, lipid    -  Primary   Relevant Orders   COMPLETE METABOLIC PANEL WITH GFR   Lipid panel   Lipoma of right upper extremity       Cherry angioma         Lipoma  - Gave reassurance about the lipoma. She will contact me if she decided to have it surgically removed.    Cherry Angioma - gave reassurance.   Screen lipids, etc.    Encouraged her to schedule her Pap smear.  Either at our office or with OB/GYN down the hall.  Her mammogram is up-to-date.  No outpatient encounter medications on file as of 10/12/2018.   No facility-administered encounter medications on file as of 10/12/2018.     Follow-up: Return if symptoms worsen or fail to improve.   Beatrice Lecher, MD

## 2018-10-13 LAB — LIPID PANEL
CHOLESTEROL: 205 mg/dL — AB (ref <200)
HDL: 54 mg/dL (ref >50)
LDL CHOLESTEROL (CALC): 127 mg/dL — AB
Non-HDL Cholesterol (Calc): 151 mg/dL (calc) — ABNORMAL HIGH (ref <130)
TRIGLYCERIDES: 125 mg/dL (ref <150)
Total CHOL/HDL Ratio: 3.8 (calc) (ref <5.0)

## 2018-10-13 LAB — COMPLETE METABOLIC PANEL WITH GFR
AG Ratio: 2 (calc) (ref 1.0–2.5)
ALKALINE PHOSPHATASE (APISO): 146 U/L — AB (ref 33–130)
ALT: 24 U/L (ref 6–29)
AST: 16 U/L (ref 10–35)
Albumin: 4.3 g/dL (ref 3.6–5.1)
BILIRUBIN TOTAL: 0.3 mg/dL (ref 0.2–1.2)
BUN: 18 mg/dL (ref 7–25)
CHLORIDE: 107 mmol/L (ref 98–110)
CO2: 27 mmol/L (ref 20–32)
Calcium: 9.3 mg/dL (ref 8.6–10.4)
Creat: 0.75 mg/dL (ref 0.50–0.99)
GFR, Est African American: 100 mL/min/{1.73_m2} (ref >=60)
GFR, Est Non African American: 86 mL/min/{1.73_m2} (ref >=60)
Globulin: 2.2 g/dL (calc) (ref 1.9–3.7)
Glucose, Bld: 100 mg/dL — ABNORMAL HIGH (ref 65–99)
Potassium: 4.4 mmol/L (ref 3.5–5.3)
Sodium: 143 mmol/L (ref 135–146)
Total Protein: 6.5 g/dL (ref 6.1–8.1)

## 2018-10-15 ENCOUNTER — Other Ambulatory Visit: Payer: Self-pay

## 2018-10-15 DIAGNOSIS — R748 Abnormal levels of other serum enzymes: Secondary | ICD-10-CM

## 2018-10-21 ENCOUNTER — Ambulatory Visit (INDEPENDENT_AMBULATORY_CARE_PROVIDER_SITE_OTHER): Payer: BC Managed Care – PPO | Admitting: Certified Nurse Midwife

## 2018-10-21 ENCOUNTER — Encounter: Payer: Self-pay | Admitting: Certified Nurse Midwife

## 2018-10-21 VITALS — BP 125/56 | HR 77 | Resp 16 | Ht 61.0 in | Wt 235.0 lb

## 2018-10-21 DIAGNOSIS — Z01419 Encounter for gynecological examination (general) (routine) without abnormal findings: Secondary | ICD-10-CM

## 2018-10-21 DIAGNOSIS — Z1151 Encounter for screening for human papillomavirus (HPV): Secondary | ICD-10-CM | POA: Diagnosis not present

## 2018-10-21 DIAGNOSIS — Z124 Encounter for screening for malignant neoplasm of cervix: Secondary | ICD-10-CM

## 2018-10-21 NOTE — Progress Notes (Signed)
Gynecology Annual Exam   History of Present Illness: Patient is a 63 y.o. G2P2 presents for annual exam. The patient has no complaints today. The patient is sexually active. She denies dyspareunia. The patient does not perform self breast exams. There is no notable family history of breast or ovarian cancer in her family.   Past Medical History:  Past Medical History:  Diagnosis Date  . Diverticulitis 01/21/13  . Nephrolithiasis   . Plantar fasciitis     Past Surgical History:  Past Surgical History:  Procedure Laterality Date  . CHOLECYSTECTOMY  1982  . TONSILECTOMY, ADENOIDECTOMY, BILATERAL MYRINGOTOMY AND TUBES     at age 37 approx.   . TUBAL LIGATION  1993    Gynecologic History:  LMP: No LMP recorded. Patient is postmenopausal. Last Pap: 10-11 years ago, normal per pt Mammogram: last completed on 06/2018, result was normal.  Obstetric History: G2P2  Family History:  Family History  Problem Relation Age of Onset  . Coronary artery disease Other        family hx of/ heart valve replacement  . Alcohol abuse Father   . Cancer Father   . Prostate cancer Father   . COPD Father   . Hyperlipidemia Father   . Cancer Mother   . Hyperlipidemia Mother   . Colon cancer Neg Hx     Social History:  Social History   Socioeconomic History  . Marital status: Married    Spouse name: Darline Faith  . Number of children: 2  . Years of education: HS  . Highest education level: Not on file  Occupational History  . Occupation: BUS DRIVER     Employer: [place]: [employer]  Social Needs  . Financial resource strain: Not on file  . Food insecurity:    Worry: Not on file    Inability: Not on file  . Transportation needs:    Medical: Not on file    Non-medical: Not on file  Tobacco Use  . Smoking status: Former Smoker    Packs/day: 0.20    Years: 2.00    Pack years: 0.40    Types: Cigarettes    Last attempt to quit: 10/14/1976   Years since quitting: 42.0  . Smokeless tobacco: Never Used  Substance and Sexual Activity  . Alcohol use: No  . Drug use: No  . Sexual activity: Yes    Birth control/protection: None  Lifestyle  . Physical activity:    Days per week: Not on file    Minutes per session: Not on file  . Stress: Not on file  Relationships  . Social connections:    Talks on phone: Not on file    Gets together: Not on file    Attends religious service: Not on file    Active member of club or organization: Not on file    Attends meetings of clubs or organizations: Not on file    Relationship status: Not on file  . Intimate partner violence:    Fear of current or ex partner: Not on file    Emotionally abused: Not on file    Physically abused: Not on file    Forced sexual activity: Not on file  Other Topics Concern  . Not on file  Social History Narrative   2-4 caffeinated drinks ( Mtn Dew)  per day. No regular exercise.     Allergies:  No Known Allergies  Medications: Prior to Admission medications  Not on File    Review of Systems: negative except noted in HPI  Physical Exam Vitals: Blood pressure (!) 125/56, pulse 77, resp. rate 16, height 5\' 1"  (1.549 m), weight 106.6 kg. General: NAD HEENT: normocephalic, atraumatic Thyroid: no enlargement, no palpable nodules Pulmonary: Normal rate and effort, CTAB Cardiovascular: RRR Breast: Breast symmetrical, no tenderness, no palpable nodules or masses, no skin or nipple retraction present, no nipple discharge. No axillary or supraclavicular lymphadenopathy. Abdomen: soft, non-tender, non-distended. No hepatomegaly, splenomegaly or masses palpable. No evidence of hernia  Genitourinary:  External: Normal external female genitalia. Normal urethral meatus  Vagina: Normal vaginal mucosa, no evidence of prolapse   Cervix: Grossly normal in appearance, no bleeding  Uterus: Non-enlarged, mobile, normal contour. No CMT  Adnexa: non-enlarged, no  masses  Rectal: deferred Extremities: no edema, erythema, or tenderness Neurologic: Grossly intact Psychiatric: mood appropriate, affect full  Female chaperone present for pelvic and breast portions of the physical exam  Assessment:  1. Well woman exam     Plan: Pap today Follow up with GYN in 1 year or prn Follow up with PCP as scheduled  Julianne Handler, CNM 10/21/2018 10:12 AM

## 2018-10-21 NOTE — Addendum Note (Signed)
Addended by: Asencion Islam on: 10/21/2018 11:47 AM   Modules accepted: Orders

## 2018-10-23 ENCOUNTER — Encounter: Payer: Self-pay | Admitting: Certified Nurse Midwife

## 2018-10-23 LAB — CYTOLOGY - PAP
ADEQUACY: ABSENT
Diagnosis: NEGATIVE
HPV (WINDOPATH): NOT DETECTED

## 2018-11-05 LAB — ALKALINE PHOSPHATASE: ALKALINE PHOSPHATASE (APISO): 119 U/L (ref 33–130)

## 2018-11-05 LAB — GAMMA GT: GGT: 17 U/L (ref 3–65)

## 2019-03-15 ENCOUNTER — Other Ambulatory Visit: Payer: Self-pay

## 2019-03-15 ENCOUNTER — Emergency Department (INDEPENDENT_AMBULATORY_CARE_PROVIDER_SITE_OTHER): Payer: BC Managed Care – PPO

## 2019-03-15 ENCOUNTER — Encounter: Payer: Self-pay | Admitting: Emergency Medicine

## 2019-03-15 ENCOUNTER — Emergency Department (INDEPENDENT_AMBULATORY_CARE_PROVIDER_SITE_OTHER)
Admission: EM | Admit: 2019-03-15 | Discharge: 2019-03-15 | Disposition: A | Discharge status: Home / Self Care | Payer: BC Managed Care – PPO | Source: Home / Self Care

## 2019-03-15 DIAGNOSIS — M25571 Pain in right ankle and joints of right foot: Secondary | ICD-10-CM

## 2019-03-15 DIAGNOSIS — S93401A Sprain of unspecified ligament of right ankle, initial encounter: Secondary | ICD-10-CM | POA: Diagnosis not present

## 2019-03-15 DIAGNOSIS — M7731 Calcaneal spur, right foot: Secondary | ICD-10-CM | POA: Diagnosis not present

## 2019-03-15 NOTE — ED Triage Notes (Signed)
RTankle pain x 1 week

## 2019-03-16 NOTE — ED Provider Notes (Signed)
[facility]    CSN: [id number] Arrival date & time: 03/15/19  1058     History   Chief Complaint Chief Complaint  Patient presents with  . Ankle Pain    HPI Stephanie [name] is a 63 y.o. female.   The history is provided by the patient. No language interpreter was used.  Ankle Pain  Location:  Ankle and foot Time since incident:  1 week Ankle location:  R ankle Foot location:  R foot Pain details:    Quality:  Aching   Radiates to:  Does not radiate   Severity:  Moderate   Onset quality:  Gradual   Duration:  1 week   Timing:  Constant   Progression:  Worsening Prior injury to area:  No Worsened by:  Nothing Ineffective treatments:  None tried Associated symptoms: swelling   Associated symptoms: no fever   Risk factors: no concern for non-accidental trauma     Past Medical History:  Diagnosis Date  . Diverticulitis 01/21/13  . Nephrolithiasis   . Plantar fasciitis     Patient Active Problem List   Diagnosis Date Noted  . Pain in right knee 03/03/2018  . Cervical pain (neck) 10/02/2015  . Achilles tendinitis of left lower extremity 09/06/2015  . Paresthesia and pain of right extremity 09/06/2015  . Splenic artery aneurysm ([place]) 01/21/2013  . Diverticulitis of colon without hemorrhage 01/21/2013  . COSTOCHONDRITIS 01/01/2011  . FATIGUE 01/01/2011  . DYSPNEA ON EXERTION 01/01/2011  . BENIGN NEOPLASM OF OTHER SPECIFIED SITES 12/18/2009    Past Surgical History:  Procedure Laterality Date  . CHOLECYSTECTOMY  1982  . TONSILECTOMY, ADENOIDECTOMY, BILATERAL MYRINGOTOMY AND TUBES     at age 58 approx.   . TUBAL LIGATION  1993    OB History    Gravida  2   Para  2   Term      Preterm      AB      Living        SAB      TAB      Ectopic      Multiple      Live Births  2            Home Medications    Prior to Admission medications   Not on File    Family History Family History  Problem Relation Age of Onset  .  Coronary artery disease Other        family hx of/ heart valve replacement  . Alcohol abuse Father   . Cancer Father   . Prostate cancer Father   . COPD Father   . Hyperlipidemia Father   . Cancer Mother   . Hyperlipidemia Mother   . Colon cancer Neg Hx     Social History Social History   Tobacco Use  . Smoking status: Former Smoker    Packs/day: 0.20    Years: 2.00    Pack years: 0.40    Types: Cigarettes    Last attempt to quit: 10/14/1976    Years since quitting: 42.4  . Smokeless tobacco: Never Used  Substance Use Topics  . Alcohol use: No  . Drug use: No     Allergies   Patient has no known allergies.   Review of Systems Review of Systems  Constitutional: Negative for fever.  Musculoskeletal: Positive for myalgias.  All other systems reviewed and are negative.    Physical Exam Triage Vital Signs ED Triage Vitals [03/15/19  1136]  Enc Vitals Group     BP 134/76     Pulse Rate 71     Resp      Temp 98.6 F (37 C)     Temp Source Oral     SpO2 97 %     Weight 227 lb (103 kg)     Height 5\' 1"  (1.549 m)     Head Circumference      Peak Flow      Pain Score 3     Pain Loc      Pain Edu?      Excl. in [place]?    No data found.  Updated Vital Signs BP 134/76 (BP Location: Right Arm)   Pulse 71   Temp 98.6 F (37 C) (Oral)   Ht 5\' 1"  (1.549 m)   Wt 103 kg   SpO2 97%   BMI 42.89 kg/m   Visual Acuity Right Eye Distance:   Left Eye Distance:   Bilateral Distance:    Right Eye Near:   Left Eye Near:    Bilateral Near:     Physical Exam Vitals signs reviewed.  HENT:     Head: Normocephalic.  Neck:     Musculoskeletal: Normal range of motion.  Pulmonary:     Effort: Pulmonary effort is normal.  Musculoskeletal:        General: Swelling present.  Skin:    General: Skin is warm.  Neurological:     General: No focal deficit present.     Mental Status: She is alert.  Psychiatric:        Mood and Affect: Mood normal.      UC  Treatments / Results  Labs (all labs ordered are listed, but only abnormal results are displayed) Labs Reviewed - No data to display  EKG None  Radiology Dg Ankle Complete Right  Result Date: 03/15/2019 CLINICAL DATA:  Right ankle pain for the past 2 weeks. No known injury. EXAM: RIGHT ANKLE - COMPLETE 3+ VIEW COMPARISON:  None. FINDINGS: No acute fracture or dislocation. The ankle mortise is symmetric. The talar dome is intact. No tibiotalar joint effusion. Joint spaces are preserved. Bone mineralization is normal. Large calcaneal and plantar enthesophytes. IMPRESSION: 1. No acute osseous abnormality or significant degenerative changes. 2. Prominent calcaneal enthesopathy. Electronically Signed   By: Titus Dubin M.D.   On: 03/15/2019 11:58    Procedures Procedures (including critical care time)  Medications Ordered in UC Medications - No data to display  Initial Impression / Assessment and Plan / UC Course  I have reviewed the triage vital signs and the nursing notes.  Pertinent labs & imaging results that were available during my care of the patient were reviewed by me and considered in my medical decision making (see chart for details).     MDM   I suspect pt may have ligamentous strain or possible tendonitis.  I advised follow up with sportsmed.  Pt advised to obtain a splint from pharmacy  Final Clinical Impressions(s) / UC Diagnoses   Final diagnoses:  Sprain of right ankle, unspecified ligament, initial encounter   Discharge Instructions   None    ED Prescriptions    None     Controlled Substance Prescriptions [place] Controlled Substance Registry consulted? Not Applicable   Fransico Meadow, [place] 03/16/19 1502

## 2019-06-14 ENCOUNTER — Other Ambulatory Visit: Payer: Self-pay

## 2019-06-14 ENCOUNTER — Ambulatory Visit (INDEPENDENT_AMBULATORY_CARE_PROVIDER_SITE_OTHER): Payer: BC Managed Care – PPO

## 2019-06-14 ENCOUNTER — Encounter: Payer: Self-pay | Admitting: Family Medicine

## 2019-06-14 ENCOUNTER — Ambulatory Visit (INDEPENDENT_AMBULATORY_CARE_PROVIDER_SITE_OTHER): Payer: BC Managed Care – PPO | Admitting: Family Medicine

## 2019-06-14 VITALS — BP 123/52 | HR 67 | Temp 98.1°F | Ht 61.0 in | Wt 235.0 lb

## 2019-06-14 DIAGNOSIS — M25552 Pain in left hip: Secondary | ICD-10-CM

## 2019-06-14 DIAGNOSIS — G8929 Other chronic pain: Secondary | ICD-10-CM

## 2019-06-14 DIAGNOSIS — M7062 Trochanteric bursitis, left hip: Secondary | ICD-10-CM

## 2019-06-14 DIAGNOSIS — M461 Sacroiliitis, not elsewhere classified: Secondary | ICD-10-CM | POA: Diagnosis not present

## 2019-06-14 DIAGNOSIS — Z23 Encounter for immunization: Secondary | ICD-10-CM | POA: Diagnosis not present

## 2019-06-14 DIAGNOSIS — M545 Low back pain, unspecified: Secondary | ICD-10-CM

## 2019-06-14 NOTE — Progress Notes (Addendum)
Acute Office Visit  Subjective:    Patient ID: Stephanie [name], female    DOB: 04-Dec-1955, 63 y.o.   MRN: [id number]  Chief Complaint  Patient presents with  . Hip Pain    L hip was seen by Dr. Georgina [name] in December    HPI Patient is in today for Left hip pain on and off since at least December of last year.  In fact she actually saw Dr. Georgina [name] for it back in December 2019 for chronic bilateral low back pain and trochanteric bursitis.  She says most of her pain is in the left lateral buttock area and over the left outer hip.  She says sometimes the upper thigh just feels a little bit numb.  Is not just on the outside of just on the inside it just feels like the whole upper leg but that comes and goes as well.  She denies any weakness.  She says sometimes the hip area feels tender to touch.  No pain in the groin crease.  She says it does seem to get worse with excessive walking or when she tried to ride her bike last week for exercise it seemed to aggravate it.  She is fairly sedentary.  She denies any pain directly over the spine.  She is tried heat and ice and ice seems to work better.  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  So had some facet hypertrophy and stenosis.  Past Medical History:  Diagnosis Date  . Diverticulitis 01/21/13  . Nephrolithiasis   . Plantar fasciitis     Past Surgical History:  Procedure Laterality Date  . CHOLECYSTECTOMY  1982  . TONSILECTOMY, ADENOIDECTOMY, BILATERAL MYRINGOTOMY AND TUBES     at age 27 approx.   . TUBAL LIGATION  1993    Family History  Problem Relation Age of Onset  . Coronary artery disease Other        family hx of/ heart valve replacement  . Alcohol abuse Father   . Cancer Father   . Prostate cancer Father   . COPD Father   . Hyperlipidemia Father   . Cancer Mother   . Hyperlipidemia Mother   . Colon  cancer Neg Hx     Social History   Socioeconomic History  . Marital status: Married    Spouse name: Stephanie [name]  . Number of children: 2  . Years of education: HS  . Highest education level: Not on file  Occupational History  . Occupation: BUS DRIVER     Employer: [place]: [employer]  Social Needs  . Financial resource strain: Not on file  . Food insecurity    Worry: Not on file    Inability: Not on file  . Transportation needs    Medical: Not on file    Non-medical: Not on file  Tobacco Use  . Smoking status: Former Smoker    Packs/day: 0.20    Years: 2.00    Pack years: 0.40    Types: Cigarettes    Quit date: 10/14/1976    Years since quitting: 42.6  . Smokeless tobacco: Never Used  Substance and Sexual Activity  . Alcohol use: No  . Drug use: No  . Sexual activity: Yes    Birth control/protection: None  Lifestyle  . Physical activity  Days per week: Not on file    Minutes per session: Not on file  . Stress: Not on file  Relationships  . Social Herbalist on phone: Not on file    Gets together: Not on file    Attends religious service: Not on file    Active member of club or organization: Not on file    Attends meetings of clubs or organizations: Not on file    Relationship status: Not on file  . Intimate partner violence    Fear of current or ex partner: Not on file    Emotionally abused: Not on file    Physically abused: Not on file    Forced sexual activity: Not on file  Other Topics Concern  . Not on file  Social History Narrative   2-4 caffeinated drinks ( Mtn Dew)  per day. No regular exercise.     No outpatient medications prior to visit.   No facility-administered medications prior to visit.     No Known Allergies  ROS     Objective:    Physical Exam  Constitutional: She is oriented to person, place, and time. She appears well-developed and well-nourished.  HENT:  Head:  Normocephalic and atraumatic.  Eyes: Conjunctivae and EOM are normal.  Cardiovascular: Normal rate.  Pulmonary/Chest: Effort normal.  Musculoskeletal:     Comments: Or over the lumbar spine tender over the left SI joint as well as the posterior hip and over the greater trochanteric bursa.  Hip, knee, ankle strength is 5-5 bilaterally.  She has very weak abductors in the left leg.  Neurological: She is alert and oriented to person, place, and time.  Skin: Skin is dry. No pallor.  Psychiatric: She has a normal mood and affect. Her behavior is normal.  Vitals reviewed.   BP (!) 123/52   Pulse 67   Temp 98.1 F (36.7 C)   Ht 5\' 1"  (1.549 m)   Wt 235 lb (106.6 kg)   SpO2 99%   BMI 44.40 kg/m  Wt Readings from Last 3 Encounters:  06/14/19 235 lb (106.6 kg)  03/15/19 227 lb (103 kg)  10/21/18 235 lb (106.6 kg)    Health Maintenance Due  Topic Date Due  . Hepatitis C Screening  November 23, 1955  . HIV Screening  08/25/1972    There are no preventive care reminders to display for this patient.   Lab Results  Component Value Date   TSH 1.356 01/02/2011   No results found for: WBC, HGB, HCT, MCV, PLT Lab Results  Component Value Date   NA 143 10/13/2018   K 4.4 10/13/2018   CO2 27 10/13/2018   GLUCOSE 100 (H) 10/13/2018   BUN 18 10/13/2018   CREATININE 0.75 10/13/2018   BILITOT 0.3 10/13/2018   ALKPHOS 160 (H) 01/02/2011   AST 16 10/13/2018   ALT 24 10/13/2018   PROT 6.5 10/13/2018   ALBUMIN 4.5 01/02/2011   CALCIUM 9.3 10/13/2018   Lab Results  Component Value Date   CHOL 205 (H) 10/13/2018   Lab Results  Component Value Date   HDL 54 10/13/2018   Lab Results  Component Value Date   LDLCALC 127 (H) 10/13/2018   Lab Results  Component Value Date   TRIG 125 10/13/2018   Lab Results  Component Value Date   CHOLHDL 3.8 10/13/2018   No results found for: HGBA1C     Assessment & Plan:   Problem List Items Addressed This Visit  None    Visit Diagnoses     Left hip pain    -  Primary   Need for immunization against influenza       Relevant Orders   Flu Vaccine QUAD 36+ mos IM (Completed)   SI (sacroiliac) joint inflammation (HCC)       Chronic bilateral low back pain without sciatica       Relevant Orders   DG Lumbar Spine Complete (Completed)   Trochanteric bursitis of left hip         Left lateral hip pain consistent with trochanteric bursitis-discussed diagnosis.  Discussed treatment options.  She did do PT for a few sessions.  Given injection today.  Given handout to do home PT again.  If not improving after 6 weeks of daily home PT then please let us know.  Chronic bilateral low back pain-we will get lumbar x-ray today.  MRI was approximately 5 years ago now that she is getting some numbness in that left thigh could be related.  SI joint tenderness-she says she does not tend to get a lot of pain there but it is very tender to touch.  Given handout with stretches to do on her own at home.  Aspiration/Injection Procedure Note Stephanie [name] [id number] 06/10/1956  Procedure: Injection Indications: pain in the left trochanteric bursa   Procedure Details Consent: Risks of procedure as well as the alternatives and risks of each were explained to the (patient/caregiver).  Consent for procedure obtained. Time Out: Verified patient identification, verified procedure, site/side was marked, verified correct patient position, special equipment/implants available, medications/allergies/relevent history reviewed, required imaging and test results available.  Performed   Local Anesthesia Used:Ethyl Chloride Spray Amount of Fluid Aspirated: minimal amount Character of Fluid: clear 1 cc of 50mg  kenalog with 9 cc of lidocaine injected into the bursa.  A sterile dressing was applied.  Patient did tolerate procedure well. Estimated blood loss: none  Stephanie [name] 06/15/2019, 12:43 PM   No orders of the defined types were placed in this  encounter.    Stephanie Lecher, MD

## 2019-07-07 LAB — HM MAMMOGRAPHY

## 2019-08-26 ENCOUNTER — Encounter: Payer: Self-pay | Admitting: Family Medicine

## 2019-09-27 ENCOUNTER — Ambulatory Visit (INDEPENDENT_AMBULATORY_CARE_PROVIDER_SITE_OTHER): Payer: BC Managed Care – PPO | Admitting: Family Medicine

## 2019-09-27 ENCOUNTER — Other Ambulatory Visit: Payer: Self-pay

## 2019-09-27 ENCOUNTER — Encounter: Payer: Self-pay | Admitting: Family Medicine

## 2019-09-27 ENCOUNTER — Ambulatory Visit (INDEPENDENT_AMBULATORY_CARE_PROVIDER_SITE_OTHER): Payer: BC Managed Care – PPO

## 2019-09-27 VITALS — Wt 227.0 lb

## 2019-09-27 DIAGNOSIS — M25552 Pain in left hip: Secondary | ICD-10-CM

## 2019-09-27 DIAGNOSIS — M7062 Trochanteric bursitis, left hip: Secondary | ICD-10-CM

## 2019-09-27 DIAGNOSIS — M461 Sacroiliitis, not elsewhere classified: Secondary | ICD-10-CM

## 2019-09-27 MED ORDER — DULOXETINE HCL 30 MG PO CPEP: 30.0000 mg | ORAL_CAPSULE | Freq: Every day | ORAL | 3 refills | Status: DC

## 2019-09-27 MED ORDER — DICLOFENAC SODIUM 1 % EX GEL: 4.0000 g | Freq: Four times a day (QID) | CUTANEOUS | 1 refills | Status: DC

## 2019-09-27 NOTE — Progress Notes (Signed)
Established Patient Office Visit  Subjective:  Patient ID: Stephanie [name], female    DOB: 1956-02-20  Age: 63 y.o. MRN: [id number]  CC:  Chief Complaint  Patient presents with  . Hip Pain    chronic left hip pain, progressively getting worse    HPI [name] presents for left hip pain.  She was last seen in August by me for left hip pain and given a trochanteric bursa injection.  Previously she had been seen in December 2019 and diagnosed with trochanteric bursitis of the left hip as well as some chronic bilateral low back pain by Dr. Lynne Leader.  He referred her for physical therapy.  She went to physical therapy twice.  Feels like her pain is getting progressively worse.  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  So had some facet hypertrophy and stenosis.  She says especially when she is been sitting for a long period of time and tries to get up and walk she will get a sharp pain in her hip.  She says it was feels like it is getting give out but has not.  Is no not locking or clicking.  She said when we did the injection back in August it did help for about a week.  But she is also been getting some pain over her low back she thinks it is close to the SI joints.  She has been alternating heat and ice which does provide some temporary relief.  She is not really had any major pain in the groin crease area.  She has been mostly relying on oral ibuprofen.  Past Medical History:  Diagnosis Date  . Diverticulitis 01/21/13  . Nephrolithiasis   . Plantar fasciitis     Past Surgical History:  Procedure Laterality Date  . CHOLECYSTECTOMY  1982  . TONSILECTOMY, ADENOIDECTOMY, BILATERAL MYRINGOTOMY AND TUBES     at age 68 approx.   . TUBAL LIGATION  1993    Family History  Problem Relation Age of Onset  . Coronary artery disease Other        family hx of/  heart valve replacement  . Alcohol abuse Father   . Cancer Father   . Prostate cancer Father   . COPD Father   . Hyperlipidemia Father   . Cancer Mother   . Hyperlipidemia Mother   . Colon cancer Neg Hx     Social History   Socioeconomic History  . Marital status: Married    Spouse name: [name]  . Number of children: 2  . Years of education: HS  . Highest education level: Not on file  Occupational History  . Occupation: BUS DRIVER     Employer: [place]: [employer]  Tobacco Use  . Smoking status: Former Smoker    Packs/day: 0.20    Years: 2.00    Pack years: 0.40    Types: Cigarettes    Quit date: 10/14/1976    Years since quitting: 42.9  . Smokeless tobacco: Never Used  Substance and Sexual Activity  . Alcohol use: No  . Drug use: No  . Sexual activity: Yes    Birth control/protection: None  Other Topics Concern  . Not on file  Social History Narrative   2-4 caffeinated drinks ( Mtn Dew)  per  day. No regular exercise.    Social Determinants of Health   Financial Resource Strain:   . Difficulty of Paying Living Expenses: Not on file  Food Insecurity:   . Worried About Charity fundraiser in the Last Year: Not on file  . Ran Out of Food in the Last Year: Not on file  Transportation Needs:   . Lack of Transportation (Medical): Not on file  . Lack of Transportation (Non-Medical): Not on file  Physical Activity:   . Days of Exercise per Week: Not on file  . Minutes of Exercise per Session: Not on file  Stress:   . Feeling of Stress : Not on file  Social Connections:   . Frequency of Communication with Friends and Family: Not on file  . Frequency of Social Gatherings with Friends and Family: Not on file  . Attends Religious Services: Not on file  . Active Member of Clubs or Organizations: Not on file  . Attends Archivist Meetings: Not on file  . Marital Status: Not on file  Intimate Partner  Violence:   . Fear of Current or Ex-Partner: Not on file  . Emotionally Abused: Not on file  . Physically Abused: Not on file  . Sexually Abused: Not on file    Outpatient Medications Prior to Visit  Medication Sig Dispense Refill  . ibuprofen (ADVIL) 200 MG tablet Take 200-600 mg by mouth every 8 (eight) hours as needed.     No facility-administered medications prior to visit.    No Known Allergies  ROS Review of Systems    Objective:    Physical Exam  Constitutional: She is oriented to person, place, and time. She appears well-developed and well-nourished.  HENT:  Head: Normocephalic and atraumatic.  Eyes: Conjunctivae and EOM are normal.  Pulmonary/Chest: Effort normal.  Neurological: She is alert and oriented to person, place, and time.  Skin: Skin is dry. No pallor.  Psychiatric: She has a normal mood and affect. Her behavior is normal.  Vitals reviewed.   Wt 227 lb (103 kg) Comment: Pt reported  BMI 42.89 kg/m  Wt Readings from Last 3 Encounters:  09/27/19 227 lb (103 kg)  06/14/19 235 lb (106.6 kg)  03/15/19 227 lb (103 kg)     Health Maintenance Due  Topic Date Due  . Hepatitis C Screening  Feb 11, 1956  . HIV Screening  08/25/1972    There are no preventive care reminders to display for this patient.  Lab Results  Component Value Date   TSH 1.356 01/02/2011   No results found for: WBC, HGB, HCT, MCV, PLT Lab Results  Component Value Date   NA 143 10/13/2018   K 4.4 10/13/2018   CO2 27 10/13/2018   GLUCOSE 100 (H) 10/13/2018   BUN 18 10/13/2018   CREATININE 0.75 10/13/2018   BILITOT 0.3 10/13/2018   ALKPHOS 160 (H) 01/02/2011   AST 16 10/13/2018   ALT 24 10/13/2018   PROT 6.5 10/13/2018   ALBUMIN 4.5 01/02/2011   CALCIUM 9.3 10/13/2018   Lab Results  Component Value Date   CHOL 205 (H) 10/13/2018   Lab Results  Component Value Date   HDL 54 10/13/2018   Lab Results  Component Value Date   LDLCALC 127 (H) 10/13/2018   Lab  Results  Component Value Date   TRIG 125 10/13/2018   Lab Results  Component Value Date   CHOLHDL 3.8 10/13/2018   No results found for: HGBA1C    Assessment &  Plan:   Problem List Items Addressed This Visit    None    Visit Diagnoses    Left hip pain    -  Primary   Relevant Medications   diclofenac Sodium (VOLTAREN) 1 % GEL   DULoxetine (CYMBALTA) 30 MG capsule   Other Relevant Orders   DG Hip Unilat W OR W/O Pelvis 2-3 Views Left   Ambulatory referral to Orthopedic Surgery   SI (sacroiliac) joint inflammation (HCC)       Relevant Medications   ibuprofen (ADVIL) 200 MG tablet   DULoxetine (CYMBALTA) 30 MG capsule   Other Relevant Orders   Ambulatory referral to Orthopedic Surgery   Trochanteric bursitis of left hip       Relevant Orders   Ambulatory referral to Orthopedic Surgery     Left hip pain-we will go ahead and get plain films today just to rule out any other causes but I think there is still a significant element of trochanteric bursitis and probably SI joint inflammation going on.  We discussed starting Cymbalta at low dose to help relieve her pain to get her more comfortable as her pain is interfering with her sleep.  We also discussed a trial of topical Voltaren gel to the hip.  Not indicated for the spine.  We also discussed referral to Ortho for further evaluation and treatment options.  Meds ordered this encounter  Medications  . diclofenac Sodium (VOLTAREN) 1 % GEL    Sig: Apply 4 g topically 4 (four) times daily. [place] for generic    Dispense:  350 g    Refill:  1  . DULoxetine (CYMBALTA) 30 MG capsule    Sig: Take 1 capsule (30 mg total) by mouth daily.    Dispense:  30 capsule    Refill:  3    Follow-up: Return if symptoms worsen or fail to improve.    Beatrice Lecher, MD

## 2019-10-01 ENCOUNTER — Telehealth: Payer: Self-pay | Admitting: Osteopathic Medicine

## 2019-10-01 NOTE — Telephone Encounter (Signed)
Patient called and is having some nausea and fatigue while taking the Cymbalta. She has been taking the medication with food and it still making her feel bad. She reports that her husband told her she looked pale. She has stopped taking the medication. She has follow up next week with PCP. Do you have any recommendation before then?

## 2019-10-01 NOTE — Telephone Encounter (Signed)
Patient has decided to just send a mychart to the PCP about her medication changes. No other questions.

## 2019-10-01 NOTE — Telephone Encounter (Signed)
See below

## 2019-10-01 NOTE — Telephone Encounter (Signed)
No further recommendations from me, keep appt w/ PCP

## 2019-10-04 ENCOUNTER — Other Ambulatory Visit: Payer: Self-pay

## 2019-10-04 ENCOUNTER — Ambulatory Visit: Payer: BC Managed Care – PPO | Admitting: Family Medicine

## 2019-10-04 ENCOUNTER — Ambulatory Visit (INDEPENDENT_AMBULATORY_CARE_PROVIDER_SITE_OTHER): Payer: BC Managed Care – PPO | Admitting: Sports Medicine

## 2019-10-04 ENCOUNTER — Ambulatory Visit (INDEPENDENT_AMBULATORY_CARE_PROVIDER_SITE_OTHER): Payer: BC Managed Care – PPO

## 2019-10-04 DIAGNOSIS — M4807 Spinal stenosis, lumbosacral region: Secondary | ICD-10-CM

## 2019-10-04 DIAGNOSIS — M48062 Spinal stenosis, lumbar region with neurogenic claudication: Secondary | ICD-10-CM | POA: Diagnosis not present

## 2019-10-04 DIAGNOSIS — M48061 Spinal stenosis, lumbar region without neurogenic claudication: Secondary | ICD-10-CM | POA: Insufficient documentation

## 2019-10-04 MED ORDER — MELOXICAM 15 MG PO TABS: ORAL_TABLET | ORAL | 3 refills | Status: DC

## 2019-10-04 MED ORDER — KETOROLAC TROMETHAMINE 60 MG/2ML IM SOLN
60.0000 mg | Freq: Once | INTRAMUSCULAR | Status: AC
  Administered 2019-10-04: 60 mg via INTRAMUSCULAR

## 2019-10-04 MED ORDER — PREDNISONE 50 MG PO TABS: ORAL_TABLET | ORAL | 0 refills | Status: DC

## 2019-10-04 MED ORDER — GABAPENTIN 300 MG PO CAPS: ORAL_CAPSULE | ORAL | 3 refills | Status: DC

## 2019-10-04 NOTE — Progress Notes (Signed)
Subjective:    I'm seeing this patient as a consultation for: Dr. Beatrice Lecher  CC: Low back pain  HPI: Stephanie [name] is a pleasant 63 year old female, she has on and off low back pain, now having a severe worsening of pain in her back, with radiation down the lateral thigh, to the anterolateral thigh bilaterally, left worse than right.  Better with slight flexion.  She did have a trochanteric bursa injection without much improvement.  No progressive weakness, she has had formal physical therapy, x-rays, nothing is helping.  I reviewed the past medical history, family history, social history, surgical history, and allergies today and no changes were needed.  Please see the problem list section below in epic for further details.  Past Medical History: Past Medical History:  Diagnosis Date  . Diverticulitis 01/21/13  . Nephrolithiasis   . Plantar fasciitis    Past Surgical History: Past Surgical History:  Procedure Laterality Date  . CHOLECYSTECTOMY  1982  . TONSILECTOMY, ADENOIDECTOMY, BILATERAL MYRINGOTOMY AND TUBES     at age 7 approx.   . TUBAL LIGATION  1993   Social History: Social History   Socioeconomic History  . Marital status: Married    Spouse name: Jocie Salmeri  . Number of children: 2  . Years of education: HS  . Highest education level: Not on file  Occupational History  . Occupation: BUS DRIVER     Employer: [place]: [employer]  Tobacco Use  . Smoking status: Former Smoker    Packs/day: 0.20    Years: 2.00    Pack years: 0.40    Types: Cigarettes    Quit date: 10/14/1976    Years since quitting: 43.0  . Smokeless tobacco: Never Used  Substance and Sexual Activity  . Alcohol use: No  . Drug use: No  . Sexual activity: Yes    Birth control/protection: None  Other Topics Concern  . Not on file  Social History Narrative   2-4 caffeinated drinks ( Mtn Dew)  per day. No regular exercise.    Social  Determinants of Health   Financial Resource Strain:   . Difficulty of Paying Living Expenses: Not on file  Food Insecurity:   . Worried About Charity fundraiser in the Last Year: Not on file  . Ran Out of Food in the Last Year: Not on file  Transportation Needs:   . Lack of Transportation (Medical): Not on file  . Lack of Transportation (Non-Medical): Not on file  Physical Activity:   . Days of Exercise per Week: Not on file  . Minutes of Exercise per Session: Not on file  Stress:   . Feeling of Stress : Not on file  Social Connections:   . Frequency of Communication with Friends and Family: Not on file  . Frequency of Social Gatherings with Friends and Family: Not on file  . Attends Religious Services: Not on file  . Active Member of Clubs or Organizations: Not on file  . Attends Archivist Meetings: Not on file  . Marital Status: Not on file   Family History: Family History  Problem Relation Age of Onset  . Coronary artery disease Other        family hx of/ heart valve replacement  . Alcohol abuse Father   . Cancer Father   . Prostate cancer Father   . COPD Father   . Hyperlipidemia Father   . Cancer Mother   .  Hyperlipidemia Mother   . Colon cancer Neg Hx    Allergies: Allergies  Allergen Reactions  . Duloxetine Nausea Only   Medications: See med rec.  Review of Systems: No headache, visual changes, nausea, vomiting, diarrhea, constipation, dizziness, abdominal pain, skin rash, fevers, chills, night sweats, weight loss, swollen lymph nodes, body aches, joint swelling, muscle aches, chest pain, shortness of breath, mood changes, visual or auditory hallucinations.   Objective:   General: Well Developed, well nourished, and in no acute distress.  Neuro:  Extra-ocular muscles intact, able to move all 4 extremities, sensation grossly intact.  Deep tendon reflexes tested were normal. Psych: Alert and oriented, mood congruent with affect. ENT:  Ears and nose  appear unremarkable.  Hearing grossly normal. Neck: Unremarkable overall appearance, trachea midline.  No visible thyroid enlargement. Eyes: Conjunctivae and lids appear unremarkable.  Pupils equal and round. Skin: Warm and dry, no rashes noted.  Cardiovascular: Pulses palpable, no extremity edema. Back Exam:  Inspection: Unremarkable  Motion: Flexion 45 deg, Extension 45 deg, Side Bending to 45 deg bilaterally,  Rotation to 45 deg bilaterally  SLR laying: Negative  XSLR laying: Negative  Palpable tenderness: Severe tenderness to palpation in the left low back. FABER: negative. Sensory change: Gross sensation intact to all lumbar and sacral dermatomes.  Reflexes: 2+ at both patellar tendons, 2+ at achilles tendons, Babinski's downgoing.  Strength at foot  Plantar-flexion: 5/5 Dorsi-flexion: 5/5 Eversion: 5/5 Inversion: 5/5  Leg strength  Quad: 5/5 Hamstring: 5/5 Hip flexor: 5/5 Hip abductors: 5/5  Gait unremarkable.  Impression and Recommendations:   This case required medical decision making of moderate complexity.  Lumbar spinal stenosis Suspect dominance at the L4-L5 level. She has failed greater than 6 weeks of conservative measures including formal physical therapy, x-rays were unrevealing. Adding an MRI for epidural planning. Adding 5 days of prednisone, Toradol intramuscular, switching from ibuprofen to meloxicam, we will likely order an epidural once I see the results of her MRI. Also adding gabapentin at night.   ___________________________________________ Gwen Her. Dianah Field, M.D., ABFM., CAQSM. Primary Care and Sports Medicine [facility]  Adjunct Professor of [place] of [facility] of Medicine

## 2019-10-04 NOTE — Patient Instructions (Signed)
Spinal Stenosis  Spinal stenosis occurs when the open space (spinal canal) between the bones of your spine (vertebrae) narrows, putting pressure on the spinal cord or nerves. What are the causes? This condition is caused by areas of bone pushing into the central canals of your vertebrae. This condition may be present at birth (congenital), or it may be caused by:  Arthritic deterioration of your vertebrae (spinal degeneration). This usually starts around age 50.  Injury or trauma to the spine.  Tumors in the spine.  Calcium deposits in the spine. What are the signs or symptoms? Symptoms of this condition include:  Pain in the neck or back that is generally worse with activities, particularly when standing and walking.  Numbness, tingling, hot or cold sensations, weakness, or weariness in your legs.  Pain going up and down the leg (sciatica).  Frequent episodes of falling.  A foot-slapping gait that leads to muscle weakness. In more serious cases, you may develop:  Problemspassing stool or passing urine.  Difficulty having sex.  Loss of feeling in part or all of your leg. Symptoms may come on slowly and get worse over time. How is this diagnosed? This condition is diagnosed based on your medical history and a physical exam. Tests will also be done, such as:  MRI.  CT scan.  X-ray. How is this treated? Treatment for this condition often focuses on managing your pain and any other symptoms. Treatment may include:  Practicing good posture to lessen pressure on your nerves.  Exercising to strengthen muscles, build endurance, improve balance, and maintain good joint movement (range of motion).  Losing weight, if needed.  Taking medicines to reduce swelling, inflammation, or pain.  Assistive devices, such as a corset or brace. In some cases, surgery may be needed. The most common procedure is decompression laminectomy. This is done to remove excess bone that puts  pressure on your nerve roots. Follow these instructions at home: Managing pain, stiffness, and swelling  Do all exercises and stretches as told by your health care provider.  Practice good posture. If you were given a brace or a corset, wear it as told by your health care provider.  Do not do any activities that cause pain. Ask your health care provider what activities are safe for you.  Do not lift anything that is heavier than 10 lb (4.5 kg) or the limit that your health care provider tells you.  Maintain a healthy weight. Talk with your health care provider if you need help losing weight.  If directed, apply heat to the affected area as often as told by your health care provider. Use the heat source that your health care provider recommends, such as a moist heat pack or a heating pad. ? Place a towel between your skin and the heat source. ? Leave the heat on for 20-30 minutes. ? Remove the heat if your skin turns bright red. This is especially important if you are not able to feel pain, heat, or cold. You may have a greater risk of getting burned. General instructions  Take over-the-counter and prescription medicines only as told by your health care provider.  Do not use any products that contain nicotine or tobacco, such as cigarettes and e-cigarettes. If you need help quitting, ask your health care provider.  Eat a healthy diet. This includes plenty of fruits and vegetables, whole grains, and low-fat (lean) protein.  Keep all follow-up visits as told by your health care provider. This is important. Contact   a health care provider if:  Your symptoms do not get better or they get worse.  You have a fever. Get help right away if:  You have new or worse pain in your neck or upper back.  You have severe pain that cannot be controlled with medicines.  You are dizzy.  You have vision problems, blurred vision, or double vision.  You have a severe headache that is worse when you  stand.  You have nausea or you vomit.  You develop new or worse numbness or tingling in your back or legs.  You have pain, redness, swelling, or warmth in your arm or leg. Summary  Spinal stenosis occurs when the open space (spinal canal) between the bones of your spine (vertebrae) narrows. This narrowing puts pressure on the spinal cord or nerves.  Spinal stenosis can cause numbness, weakness, or pain in the neck, back, and legs.  This condition may be caused by a birth defect, arthritic deterioration of your vertebrae, injury, tumors, or calcium deposits.  This condition is usually diagnosed with MRIs, CT scans, and X-rays. This information is not intended to replace advice given to you by your health care provider. Make sure you discuss any questions you have with your health care provider. Document Released: 12/21/2003 Document Revised: 09/12/2017 Document Reviewed: 09/04/2016 Elsevier Patient Education  2020 [employer].

## 2019-10-04 NOTE — Assessment & Plan Note (Signed)
Suspect dominance at the L4-L5 level. She has failed greater than 6 weeks of conservative measures including formal physical therapy, x-rays were unrevealing. Adding an MRI for epidural planning. Adding 5 days of prednisone, Toradol intramuscular, switching from ibuprofen to meloxicam, we will likely order an epidural once I see the results of her MRI. Also adding gabapentin at night.

## 2019-11-01 ENCOUNTER — Ambulatory Visit (INDEPENDENT_AMBULATORY_CARE_PROVIDER_SITE_OTHER): Payer: BC Managed Care – PPO | Admitting: Sports Medicine

## 2019-11-01 DIAGNOSIS — M48062 Spinal stenosis, lumbar region with neurogenic claudication: Secondary | ICD-10-CM

## 2019-11-01 NOTE — Progress Notes (Signed)
    Procedures performed today:    None.  Independent interpretation of tests performed by another provider:   None.  Impression and Recommendations:    Lumbar spinal stenosis Stephanie [name] returns, she is a pleasant 64 year old female, we have been treating her for lumbar spinal stenosis with gabapentin currently at 300 mg 3 times daily as well as an occasional meloxicam. Overall doing much better, pain is livable. Added some cervical and lumbar rehab exercises. Return to see me as needed, certainly the next step would be an L4-L5 epidural if needed.    ___________________________________________ Gwen Her. Dianah Field, M.D., ABFM., CAQSM. Primary Care and [place] Instructor of [place] of [facility] of Medicine

## 2019-11-01 NOTE — Assessment & Plan Note (Signed)
Stephanie [name] returns, she is a pleasant 64 year old female, we have been treating her for lumbar spinal stenosis with gabapentin currently at 300 mg 3 times daily as well as an occasional meloxicam. Overall doing much better, pain is livable. Added some cervical and lumbar rehab exercises. Return to see me as needed, certainly the next step would be an L4-L5 epidural if needed.

## 2020-01-03 ENCOUNTER — Other Ambulatory Visit: Payer: Self-pay

## 2020-01-03 ENCOUNTER — Ambulatory Visit (INDEPENDENT_AMBULATORY_CARE_PROVIDER_SITE_OTHER): Payer: BC Managed Care – PPO | Admitting: Sports Medicine

## 2020-01-03 DIAGNOSIS — M1711 Unilateral primary osteoarthritis, right knee: Secondary | ICD-10-CM

## 2020-01-03 NOTE — Assessment & Plan Note (Signed)
Stephanie [name] is a pleasant 64 year old female with severe worsening pain in her right knee, medial joint line, mild swelling. No trauma. She has a history of left knee arthritis, that responded extremely well to an intra-articular injection with Dr. Theda Sers. Today we injected her right knee, return to see me in 1 month.

## 2020-01-03 NOTE — Progress Notes (Signed)
    Procedures performed today:    Procedure: Real-time Ultrasound Guided injection of the right knee Device: Samsung HS60  Verbal informed consent obtained.  Time-out conducted.  Noted no overlying erythema, induration, or other signs of local infection.  Skin prepped in a sterile fashion.  Local anesthesia: Topical Ethyl chloride.  With sterile technique and under real time ultrasound guidance: 1 cc Kenalog 40, 2 cc lidocaine, 2 cc bupivacaine injected easily Completed without difficulty  Pain immediately resolved suggesting accurate placement of the medication.  Advised to call if fevers/chills, erythema, induration, drainage, or persistent bleeding.  Images permanently stored and available for review in the ultrasound unit.  Impression: Technically successful ultrasound guided injection.  Independent interpretation of notes and tests performed by another provider:   None.  Impression and Recommendations:    Primary osteoarthritis of right knee Stephanie [name] is a pleasant 64 year old female with severe worsening pain in her right knee, medial joint line, mild swelling. No trauma. She has a history of left knee arthritis, that responded extremely well to an intra-articular injection with Dr. Theda Sers. Today we injected her right knee, return to see me in 1 month.    ___________________________________________ Gwen Her. Dianah Field, M.D., ABFM., CAQSM. Primary Care and [place] Instructor of [place] of [facility] of Medicine

## 2020-01-04 NOTE — Telephone Encounter (Signed)
The should probably go to her PCP, not her sports provider.

## 2020-01-06 NOTE — Telephone Encounter (Signed)
She needs appt if worse HA ever.  What has she already tried.  Any other sxs. This needs to be triaged.

## 2020-01-07 NOTE — Telephone Encounter (Signed)
Spoke w/pt and she stated that she had sent a my chart to Dr. Dianah Field about this because she had received a knee injection from him on 01/03/2020 and  By 10 or 1030 pm that night she began to experience the "worse" headache ever. She stated that she had sent the my chart and she had also left a voice mail for Amber to call her back and never heard anything back regarding this.   She takes Gabapentin 300 mg and Mobic 15 mg and wasn't sure if anything else would interfere with these medications.   She looked up if the headache was a side effect of the injection and it is. She decided to take Tylenol ES which helped her some but she reports that she still hasn't felt good. She denies any bodyaches,fevers,sweats,or chills she does have some flushing in her face.   Will fwd to pcp and Dr. Tildon Husky, [place], [place]

## 2020-01-07 NOTE — Telephone Encounter (Signed)
Some people can get a flushing sensation after steroid injections but headache would be atypical, continue over-the-counter analgesics, and please keep follow-up with primary care provider for full evaluation of a new headache.

## 2020-01-28 ENCOUNTER — Other Ambulatory Visit: Payer: Self-pay | Admitting: Sports Medicine

## 2020-01-28 DIAGNOSIS — M48062 Spinal stenosis, lumbar region with neurogenic claudication: Secondary | ICD-10-CM

## 2020-01-31 ENCOUNTER — Encounter: Payer: Self-pay | Admitting: Sports Medicine

## 2020-01-31 ENCOUNTER — Ambulatory Visit (INDEPENDENT_AMBULATORY_CARE_PROVIDER_SITE_OTHER): Payer: BC Managed Care – PPO | Admitting: Sports Medicine

## 2020-01-31 ENCOUNTER — Ambulatory Visit (INDEPENDENT_AMBULATORY_CARE_PROVIDER_SITE_OTHER): Payer: BC Managed Care – PPO

## 2020-01-31 ENCOUNTER — Other Ambulatory Visit: Payer: Self-pay

## 2020-01-31 DIAGNOSIS — M1711 Unilateral primary osteoarthritis, right knee: Secondary | ICD-10-CM | POA: Diagnosis not present

## 2020-01-31 DIAGNOSIS — M48062 Spinal stenosis, lumbar region with neurogenic claudication: Secondary | ICD-10-CM

## 2020-01-31 MED ORDER — GABAPENTIN 300 MG PO CAPS: 300.0000 mg | ORAL_CAPSULE | Freq: Three times a day (TID) | ORAL | 3 refills | Status: DC

## 2020-01-31 NOTE — Assessment & Plan Note (Signed)
Stephanie [name] returns, she continues with gabapentin 300 mg 3 times daily. She has lumbar spinal stenosis. Overall she is doing good, happy with her back discomfort level.

## 2020-01-31 NOTE — Assessment & Plan Note (Addendum)
Stephanie [name] returns, she had some relief in her right knee pain after the injection but it was short-lived, she now has a recurrence of pain, medial joint line, pain with twisting consistent with meniscal injury. We discussed the natural history of knee arthritis, at this point she is a candidate for an MRI, she is also can get more consistent with her meloxicam. Hopefully we can get the MRI today, I did discuss if we see a large meniscal tear I would refer to her arthroscopy, if we simply see more arthritis we would proceed with viscosupplementation. She is going to go and get updated x-rays today.  MRI confirms lateral meniscal tear, referral to Dr. Theda Sers for knee arthroscopy per her request.

## 2020-01-31 NOTE — Progress Notes (Addendum)
    Procedures performed today:    None.  Independent interpretation of notes and tests performed by another provider:   None.  Brief History, Exam, Impression, and Recommendations:    Primary osteoarthritis of right knee Stephanie [name] returns, she had some relief in her right knee pain after the injection but it was short-lived, she now has a recurrence of pain, medial joint line, pain with twisting consistent with meniscal injury. We discussed the natural history of knee arthritis, at this point she is a candidate for an MRI, she is also can get more consistent with her meloxicam. Hopefully we can get the MRI today, I did discuss if we see a large meniscal tear I would refer to her arthroscopy, if we simply see more arthritis we would proceed with viscosupplementation. She is going to go and get updated x-rays today.  MRI confirms lateral meniscal tear, referral to Dr. Theda Sers for knee arthroscopy per her request.  Lumbar spinal stenosis Stephanie [name] returns, she continues with gabapentin 300 mg 3 times daily. She has lumbar spinal stenosis. Overall she is doing good, happy with her back discomfort level.    ___________________________________________ Stephanie Her. Dianah Field, M.D., ABFM., CAQSM. Primary Care and [place] Instructor of [place] of [facility] of Medicine

## 2020-02-01 NOTE — Addendum Note (Signed)
Addended by: Silverio Decamp on: 02/01/2020 10:43 AM   Modules accepted: Orders

## 2020-02-04 ENCOUNTER — Ambulatory Visit: Payer: BC Managed Care – PPO | Admitting: Sports Medicine

## 2020-02-12 HISTORY — PX: KNEE ARTHROSCOPY: SHX127

## 2020-05-30 ENCOUNTER — Emergency Department (INDEPENDENT_AMBULATORY_CARE_PROVIDER_SITE_OTHER)
Admission: EM | Admit: 2020-05-30 | Discharge: 2020-05-30 | Disposition: A | Discharge status: Home / Self Care | Payer: BC Managed Care – PPO | Source: Home / Self Care

## 2020-05-30 ENCOUNTER — Other Ambulatory Visit: Payer: Self-pay

## 2020-05-30 ENCOUNTER — Emergency Department (INDEPENDENT_AMBULATORY_CARE_PROVIDER_SITE_OTHER): Payer: BC Managed Care – PPO

## 2020-05-30 DIAGNOSIS — M79672 Pain in left foot: Secondary | ICD-10-CM

## 2020-05-30 DIAGNOSIS — M79671 Pain in right foot: Secondary | ICD-10-CM

## 2020-05-30 DIAGNOSIS — M7732 Calcaneal spur, left foot: Secondary | ICD-10-CM

## 2020-05-30 NOTE — Discharge Instructions (Signed)
°  Try to elevate your foot and apply a cool compress 2-3 times daily to help with discomfort.  Call to schedule a follow up appointment with sports medicine or your orthopedist for further evaluation and treatment of your foot pain.

## 2020-05-30 NOTE — ED Triage Notes (Signed)
Patient presents to Urgent Care with complaints of left lateral foot pain since 3-4 days ago. Patient reports it is intermittent, has been trying ice and heat at home as well as tylenol. Pt had recent surgery on her right knee, not sure if she has been overcompensating for the knee pain and that has caused the foot to hurt, has been trying more supportive shoes.

## 2020-05-30 NOTE — ED Provider Notes (Signed)
[name]    CSN: [id number] Arrival date & time: 05/30/20  0856      History   Chief Complaint Chief Complaint  Patient presents with  . Foot Pain    Left    HPI Stephanie [name] is a 64 y.o. female.   HPI Stephanie [name] is a 64 y.o. female presenting to UC with c/o gradually worsening Left lateral foot pain that last 3-4 days. Pain is sharp and burning, worse with weightbearing.  No known injury but reports having to overcompensate with Left leg/foot due to Right knee pain. She has Right knee surgery in May for a lateral meniscal tear, was doing well, but a few weeks later she developed pain in the medial aspect of her Right knee. She has f/u with her orthopedist mid September but wanted to be seen for her foot today.  She has tried more supportive shoes without relief.   Past Medical History:  Diagnosis Date  . Diverticulitis 01/21/13  . Nephrolithiasis   . Plantar fasciitis     Patient Active Problem List   Diagnosis Date Noted  . Lumbar spinal stenosis 10/04/2019  . Primary osteoarthritis of right knee 03/03/2018  . Cervical pain (neck) 10/02/2015  . Achilles tendinitis of left lower extremity 09/06/2015  . Paresthesia and pain of right extremity 09/06/2015  . Splenic artery aneurysm ([place]) 01/21/2013  . Diverticulitis of colon without hemorrhage 01/21/2013  . COSTOCHONDRITIS 01/01/2011  . FATIGUE 01/01/2011  . DYSPNEA ON EXERTION 01/01/2011  . BENIGN NEOPLASM OF OTHER SPECIFIED SITES 12/18/2009    Past Surgical History:  Procedure Laterality Date  . CHOLECYSTECTOMY  1982  . TONSILECTOMY, ADENOIDECTOMY, BILATERAL MYRINGOTOMY AND TUBES     at age 11 approx.   . TUBAL LIGATION  1993    OB History    Gravida  2   Para  2   Term      Preterm      AB      Living        SAB      TAB      Ectopic      Multiple      Live Births  2            Home Medications    Prior to Admission medications   Medication Sig Start Date End Date  Taking? Authorizing Provider  gabapentin (NEURONTIN) 300 MG capsule Take 1 capsule (300 mg total) by mouth 3 (three) times daily. 01/31/20   Silverio Decamp, MD  ibuprofen (ADVIL) 200 MG tablet Take 200-600 mg by mouth every 8 (eight) hours as needed.    [provider]    Family History Family History  Problem Relation Age of Onset  . Coronary artery disease Other        family hx of/ heart valve replacement  . Alcohol abuse Father   . Cancer Father   . Prostate cancer Father   . COPD Father   . Hyperlipidemia Father   . Cancer Mother   . Hyperlipidemia Mother   . Colon cancer Neg Hx     Social History Social History   Tobacco Use  . Smoking status: Former Smoker    Packs/day: 0.20    Years: 2.00    Pack years: 0.40    Types: Cigarettes    Quit date: 10/14/1976    Years since quitting: 43.6  . Smokeless tobacco: Never Used  Vaping Use  . Vaping Use: Never used  Substance Use Topics  . Alcohol use: No  . Drug use: No     Allergies   Duloxetine   Review of Systems Review of Systems  Musculoskeletal: Positive for arthralgias, gait problem and joint swelling.  Skin: Negative for color change and wound.  Neurological: Negative for weakness and numbness.     Physical Exam Triage Vital Signs ED Triage Vitals  Enc Vitals Group     BP --      Pulse Rate 05/30/20 0939 76     Resp 05/30/20 0939 18     Temp 05/30/20 0939 97.8 F (36.6 C)     Temp Source 05/30/20 0939 Oral     SpO2 05/30/20 0939 97 %     Weight --      Height --      Head Circumference --      Peak Flow --      Pain Score 05/30/20 0937 5     Pain Loc --      Pain Edu? --      Excl. in [place]? --    No data found.  Updated Vital Signs Pulse 76   Temp 97.8 F (36.6 C) (Oral)   Resp 18   SpO2 97%   Visual Acuity Right Eye Distance:   Left Eye Distance:   Bilateral Distance:    Right Eye Near:   Left Eye Near:    Bilateral Near:     Physical Exam Vitals and nursing  note reviewed.  Constitutional:      Appearance: Normal appearance. She is well-developed.  HENT:     Head: Normocephalic and atraumatic.  Cardiovascular:     Rate and Rhythm: Normal rate and regular rhythm.     Pulses:          Dorsalis pedis pulses are 2+ on the left side.       Posterior tibial pulses are 2+ on the left side.  Pulmonary:     Effort: Pulmonary effort is normal.  Musculoskeletal:        General: Tenderness present. No swelling. Normal range of motion.     Cervical back: Normal range of motion.       Feet:  Skin:    General: Skin is warm and dry.     Capillary Refill: Capillary refill takes less than 2 seconds.     Findings: No bruising or erythema.  Neurological:     Mental Status: She is alert and oriented to person, place, and time.     Sensory: No sensory deficit.  Psychiatric:        Behavior: Behavior normal.      UC Treatments / Results  Labs (all labs ordered are listed, but only abnormal results are displayed) Labs Reviewed - No data to display  EKG   Radiology DG Foot Complete Left  Result Date: 05/30/2020 CLINICAL DATA:  Left lateral foot pain.  No known injury. EXAM: LEFT FOOT - COMPLETE 3+ VIEW COMPARISON:  Left ankle series 08/21/2015. FINDINGS: Cortical thickening left second metatarsal consistent with scratch prior stress fracture. Tiny well-circumscribed bony densities noted the base of the left fifth metatarsal, possibly representing old fracture fragments or secondary ossification centers. Prominent calcaneal spurring. Fascia calcification. IMPRESSION: 1. Mild cortical thickening left second metatarsal consistent with prior stress fracture. 2. Tiny bony densities noted the base of the left fifth metatarsal possibly representing old tiny fracture fragments or secondary ossification centers. 3.  Prominent calcaneal spurring.  Plantar fascia ossification. Electronically Signed  By: [place]   On: 05/30/2020 10:24     Procedures Procedures (including critical care time)  Medications Ordered in UC Medications - No data to display  Initial Impression / Assessment and Plan / UC Course  I have reviewed the triage vital signs and the nursing notes.  Pertinent labs & imaging results that were available during my care of the patient were reviewed by me and considered in my medical decision making (see chart for details).    Reviewed imaging Pt placed in short walking boot for comfort. Encouraged to keep appointment with orthopedist, may call sports medicine or orthopedist to see if she can be scheduled sooner than mid-September Home care instructions discussed. AVS given  Final Clinical Impressions(s) / UC Diagnoses   Final diagnoses:  Calcaneal spur of foot, left  Acute foot pain, left     Discharge Instructions      Try to elevate your foot and apply a cool compress 2-3 times daily to help with discomfort.  Call to schedule a follow up appointment with sports medicine or your orthopedist for further evaluation and treatment of your foot pain.    ED Prescriptions    None     PDMP not reviewed this encounter.   Noe Gens, PA-C 05/30/20 1057

## 2020-05-31 ENCOUNTER — Encounter: Payer: Self-pay | Admitting: Sports Medicine

## 2020-05-31 ENCOUNTER — Ambulatory Visit (INDEPENDENT_AMBULATORY_CARE_PROVIDER_SITE_OTHER): Payer: BC Managed Care – PPO | Admitting: Sports Medicine

## 2020-05-31 DIAGNOSIS — M79672 Pain in left foot: Secondary | ICD-10-CM

## 2020-05-31 MED ORDER — CALCIUM CARBONATE-VITAMIN D 600-400 MG-UNIT PO TABS: 1.0000 | ORAL_TABLET | Freq: Two times a day (BID) | ORAL | 11 refills | Status: DC

## 2020-05-31 NOTE — Assessment & Plan Note (Signed)
This is a pleasant 64 year old female, she is post right knee arthroscopy, since then she signs of pain in her left foot, localized at the shaft and base of the fifth metatarsal. She was seen in urgent care, x-rays were obtained that were unremarkable for the most part, she was placed in a boot. On exam she has tenderness at the shaft of the fifth metatarsal. She is having difficulty tolerating her boot so we will transition her into a postop shoe which is all that is needed for immobilization of anatomical structures distal to the subtalar joint. Adding a calcium and vitamin D supplement, we discussed the natural history of stress fractures and the likelihood of prolonged recovery, if no better at the 1 month point we will get an MRI.

## 2020-05-31 NOTE — Progress Notes (Signed)
    Procedures performed today:    None.  Independent interpretation of notes and tests performed by another provider:   X-rays personally reviewed, there is evidence of an old stress fracture of the second metatarsal, fifth metatarsal is mostly normal with the exception of some small ossicles just past the base.  Brief History, Exam, Impression, and Recommendations:    Left foot pain This is a pleasant 64 year old female, she is post right knee arthroscopy, since then she signs of pain in her left foot, localized at the shaft and base of the fifth metatarsal. She was seen in urgent care, x-rays were obtained that were unremarkable for the most part, she was placed in a boot. On exam she has tenderness at the shaft of the fifth metatarsal. She is having difficulty tolerating her boot so we will transition her into a postop shoe which is all that is needed for immobilization of anatomical structures distal to the subtalar joint. Adding a calcium and vitamin D supplement, we discussed the natural history of stress fractures and the likelihood of prolonged recovery, if no better at the 1 month point we will get an MRI.    ___________________________________________ Gwen Her. Dianah Field, M.D., ABFM., CAQSM. Primary Care and [place] Instructor of [place] of [facility] of Medicine

## 2020-06-05 ENCOUNTER — Other Ambulatory Visit: Payer: Self-pay | Admitting: Sports Medicine

## 2020-06-05 DIAGNOSIS — M48062 Spinal stenosis, lumbar region with neurogenic claudication: Secondary | ICD-10-CM

## 2020-06-28 ENCOUNTER — Other Ambulatory Visit: Payer: Self-pay

## 2020-06-28 ENCOUNTER — Ambulatory Visit (INDEPENDENT_AMBULATORY_CARE_PROVIDER_SITE_OTHER): Payer: BC Managed Care – PPO | Admitting: Sports Medicine

## 2020-06-28 DIAGNOSIS — M79672 Pain in left foot: Secondary | ICD-10-CM | POA: Diagnosis not present

## 2020-06-28 MED ORDER — TRAMADOL HCL 50 MG PO TABS: 50.0000 mg | ORAL_TABLET | Freq: Three times a day (TID) | ORAL | 0 refills | Status: DC | PRN

## 2020-06-28 NOTE — Progress Notes (Signed)
    Procedures performed today:    None.  Independent interpretation of notes and tests performed by another provider:   None.  Brief History, Exam, Impression, and Recommendations:    Left foot pain Stephanie [name] returns, she is a pleasant 64 year old female post right knee arthroscopy, she also had some pain in her left foot, it has always been localized at the shaft and the base of the fifth metatarsal. X-rays were for the most part unremarkable with the exception of evidence of a prior stress fracture in a different metatarsal. We had initially placed her in a boot but we transition her into a postop shoe at the last visit approximately a month ago. She has had some mild improvement, but insufficiently so. Adding a lateral heel wedge, to help unload her fifth metatarsal, and I am going to proceed with MRI as I do think she has a stress injury. Meloxicam does not seem sufficient for her pain at night so we will add some tramadol to use nightly. Return to see me to go over MRI results.    ___________________________________________ Gwen Her. Dianah Field, M.D., ABFM., CAQSM. Primary Care and [place] Instructor of [place] of [facility] of Medicine

## 2020-06-28 NOTE — Assessment & Plan Note (Signed)
Drake returns, she is a pleasant 64 year old female post right knee arthroscopy, she also had some pain in her left foot, it has always been localized at the shaft and the base of the fifth metatarsal. X-rays were for the most part unremarkable with the exception of evidence of a prior stress fracture in a different metatarsal. We had initially placed her in a boot but we transition her into a postop shoe at the last visit approximately a month ago. She has had some mild improvement, but insufficiently so. Adding a lateral heel wedge, to help unload her fifth metatarsal, and I am going to proceed with MRI as I do think she has a stress injury. Meloxicam does not seem sufficient for her pain at night so we will add some tramadol to use nightly. Return to see me to go over MRI results.

## 2020-07-03 ENCOUNTER — Other Ambulatory Visit: Payer: Self-pay | Admitting: Specialist

## 2020-07-03 DIAGNOSIS — M25561 Pain in right knee: Secondary | ICD-10-CM

## 2020-07-04 ENCOUNTER — Other Ambulatory Visit: Payer: Self-pay

## 2020-07-04 ENCOUNTER — Ambulatory Visit (INDEPENDENT_AMBULATORY_CARE_PROVIDER_SITE_OTHER): Payer: BC Managed Care – PPO

## 2020-07-04 DIAGNOSIS — M79672 Pain in left foot: Secondary | ICD-10-CM | POA: Diagnosis not present

## 2020-07-04 DIAGNOSIS — M25561 Pain in right knee: Secondary | ICD-10-CM | POA: Diagnosis not present

## 2020-07-13 ENCOUNTER — Encounter: Payer: Self-pay | Admitting: Family Medicine

## 2020-07-13 ENCOUNTER — Ambulatory Visit (INDEPENDENT_AMBULATORY_CARE_PROVIDER_SITE_OTHER): Payer: BC Managed Care – PPO | Admitting: Family Medicine

## 2020-07-13 VITALS — BP 113/39 | HR 63 | Ht 61.0 in | Wt 231.0 lb

## 2020-07-13 DIAGNOSIS — Z6841 Body Mass Index (BMI) 40.0 and over, adult: Secondary | ICD-10-CM | POA: Diagnosis not present

## 2020-07-13 DIAGNOSIS — Z0000 Encounter for general adult medical examination without abnormal findings: Secondary | ICD-10-CM | POA: Diagnosis not present

## 2020-07-13 DIAGNOSIS — Z23 Encounter for immunization: Secondary | ICD-10-CM | POA: Diagnosis not present

## 2020-07-13 DIAGNOSIS — Z1231 Encounter for screening mammogram for malignant neoplasm of breast: Secondary | ICD-10-CM

## 2020-07-13 LAB — LIPID PANEL W/REFLEX DIRECT LDL
Cholesterol: 197 mg/dL (ref <200)
HDL: 47 mg/dL — ABNORMAL LOW (ref >=50)
LDL Cholesterol (Calc): 120 mg/dL (calc) — ABNORMAL HIGH
Non-HDL Cholesterol (Calc): 150 mg/dL (calc) — ABNORMAL HIGH (ref <130)
Total CHOL/HDL Ratio: 4.2 (calc) (ref <5.0)
Triglycerides: 180 mg/dL — ABNORMAL HIGH (ref <150)

## 2020-07-13 LAB — COMPLETE METABOLIC PANEL WITH GFR
AG Ratio: 1.9 (calc) (ref 1.0–2.5)
ALT: 22 U/L (ref 6–29)
AST: 17 U/L (ref 10–35)
Albumin: 4.4 g/dL (ref 3.6–5.1)
Alkaline phosphatase (APISO): 121 U/L (ref 37–153)
BUN: 18 mg/dL (ref 7–25)
CO2: 28 mmol/L (ref 20–32)
Calcium: 9.6 mg/dL (ref 8.6–10.4)
Chloride: 106 mmol/L (ref 98–110)
Creat: 0.72 mg/dL (ref 0.50–0.99)
GFR, Est African American: 104 mL/min/{1.73_m2} (ref >=60)
GFR, Est Non African American: 90 mL/min/{1.73_m2} (ref >=60)
Globulin: 2.3 g/dL (calc) (ref 1.9–3.7)
Glucose, Bld: 106 mg/dL — ABNORMAL HIGH (ref 65–99)
Potassium: 4.4 mmol/L (ref 3.5–5.3)
Sodium: 142 mmol/L (ref 135–146)
Total Bilirubin: 0.4 mg/dL (ref 0.2–1.2)
Total Protein: 6.7 g/dL (ref 6.1–8.1)

## 2020-07-13 NOTE — Patient Instructions (Addendum)
Recommend downloading a calorie apps such as my fitness pal or lose it to track your calories and set goals.  Encourage you to cut out or artificial sweeteners and really work on cutting out carbohydrates to see if you feel like some of the inflammation and joint pain in your body is actually better. Return to see back in 4 weeks so that we can make sure that you are meeting your goals for weight loss and if you are not then we can talk about alternative options at that time.

## 2020-07-13 NOTE — Assessment & Plan Note (Signed)
Recommend downloading a calorie apps such as my fitness pal or lose it to track your calories and set goals.  Encourage you to cut out or artificial sweeteners and really work on cutting out carbohydrates to see if you feel like some of the inflammation and joint pain in your body is actually better. Return to see back in 4 weeks so that we can make sure that you are meeting your goals for weight loss and if you are not then we can talk about alternative options at that time.

## 2020-07-13 NOTE — Progress Notes (Signed)
Subjective:     Stephanie [name] is a 64 y.o. female and is here for a comprehensive physical exam. The patient reports problems - continue to have knee problems. .  She is supposed to have surgery for knee replacement but has to lose about 13 lbs to get her BMI under 40. Want to work on this so can have surgery.   Social History   Socioeconomic History  . Marital status: Married    Spouse name: Ayriel Texidor  . Number of children: 2  . Years of education: HS  . Highest education level: Not on file  Occupational History  . Occupation: BUS DRIVER     Employer: [place]: [employer]  Tobacco Use  . Smoking status: Former Smoker    Packs/day: 0.20    Years: 2.00    Pack years: 0.40    Types: Cigarettes    Quit date: 10/14/1976    Years since quitting: 43.7  . Smokeless tobacco: Never Used  Vaping Use  . Vaping Use: Never used  Substance and Sexual Activity  . Alcohol use: No  . Drug use: No  . Sexual activity: Yes    Birth control/protection: None  Other Topics Concern  . Not on file  Social History Narrative   2-4 caffeinated drinks ( Mtn Dew)  per day. No regular exercise.    Social Determinants of Health   Financial Resource Strain:   . Difficulty of Paying Living Expenses: Not on file  Food Insecurity:   . Worried About Charity fundraiser in the Last Year: Not on file  . Ran Out of Food in the Last Year: Not on file  Transportation Needs:   . Lack of Transportation (Medical): Not on file  . Lack of Transportation (Non-Medical): Not on file  Physical Activity:   . Days of Exercise per Week: Not on file  . Minutes of Exercise per Session: Not on file  Stress:   . Feeling of Stress : Not on file  Social Connections:   . Frequency of Communication with Friends and Family: Not on file  . Frequency of Social Gatherings with Friends and Family: Not on file  . Attends Religious Services: Not on file  . Active Member of Clubs or  Organizations: Not on file  . Attends Archivist Meetings: Not on file  . Marital Status: Not on file  Intimate Partner Violence:   . Fear of Current or Ex-Partner: Not on file  . Emotionally Abused: Not on file  . Physically Abused: Not on file  . Sexually Abused: Not on file   Health Maintenance  Topic Date Due  . Hepatitis C Screening  Never done  . HIV Screening  Never done  . MAMMOGRAM  07/06/2021  . PAP SMEAR-Modifier  10/21/2021  . COLONOSCOPY  03/30/2023  . TETANUS/TDAP  12/07/2025  . INFLUENZA VACCINE  Completed  . COVID-19 Vaccine  Completed    The following portions of the patient's history were reviewed and updated as appropriate: allergies, current medications, past family history, past medical history, past social history, past surgical history and problem list.  Review of Systems A comprehensive review of systems was negative.   Objective:    BP (!) 113/39   Pulse 63   Ht 5\' 1"  (1.549 m)   Wt 231 lb (104.8 kg)   BMI 43.65 kg/m  General appearance: alert, cooperative and appears stated age Head: Normocephalic, without  obvious abnormality, atraumatic Eyes: conj claer, EOMi, PEERLA Ears: normal TM's and external ear canals both ears Nose: Nares normal. Septum midline. Mucosa normal. No drainage or sinus tenderness. Throat: lips, mucosa, and tongue normal; teeth and gums normal Neck: no adenopathy, no carotid bruit, no JVD, supple, symmetrical, trachea midline and thyroid not enlarged, symmetric, no tenderness/mass/nodules Back: symmetric, no curvature. ROM normal. No CVA tenderness. Lungs: clear to auscultation bilaterally Heart: regular rate and rhythm, S1, S2 normal, no murmur, click, rub or gallop Abdomen: soft, non-tender; bowel sounds normal; no masses,  no organomegaly Extremities: extremities normal, atraumatic, no cyanosis or edema Pulses: 2+ and symmetric Skin: Skin color, texture, turgor normal. No rashes or lesions Lymph nodes: Cervical  adenopathy: nl and Supraclavicular adenopathy: nl Neurologic: Alert and oriented X 3, normal strength and tone. Normal symmetric reflexes. Normal coordination and gait    Assessment:    Healthy female exam.      Plan:     See After Visit Summary for Counseling Recommendations   Keep up a regular exercise program and make sure you are eating a healthy diet Try to eat 4 servings of dairy a day, or if you are lactose intolerant take a calcium with vitamin D daily.  Your vaccines are up to date. Shingles vaccine given today.  Due for mammo and labs.      BMI 40.0-44.9, adult ([place]) Recommend downloading a calorie apps such as my fitness pal or lose it to track your calories and set goals.  Encourage you to cut out or artificial sweeteners and really work on cutting out carbohydrates to see if you feel like some of the inflammation and joint pain in your body is actually better. Return to see back in 4 weeks so that we can make sure that you are meeting your goals for weight loss and if you are not then we can talk about alternative options at that time.

## 2020-07-17 ENCOUNTER — Ambulatory Visit (INDEPENDENT_AMBULATORY_CARE_PROVIDER_SITE_OTHER): Payer: BC Managed Care – PPO

## 2020-07-17 ENCOUNTER — Ambulatory Visit (INDEPENDENT_AMBULATORY_CARE_PROVIDER_SITE_OTHER): Payer: BC Managed Care – PPO | Admitting: Sports Medicine

## 2020-07-17 ENCOUNTER — Encounter: Payer: Self-pay | Admitting: Sports Medicine

## 2020-07-17 ENCOUNTER — Other Ambulatory Visit: Payer: Self-pay

## 2020-07-17 DIAGNOSIS — M79672 Pain in left foot: Secondary | ICD-10-CM | POA: Diagnosis not present

## 2020-07-17 NOTE — Progress Notes (Signed)
    Procedures performed today:    Procedure: Real-time Ultrasound Guided injection of the left distal peroneal sheath Device: Samsung HS60  Verbal informed consent obtained.  Time-out conducted.  Noted no overlying erythema, induration, or other signs of local infection.  Skin prepped in a sterile fashion.  Local anesthesia: Topical Ethyl chloride.  With sterile technique and under real time ultrasound guidance: 1 cc Kenalog 40, 1 cc lidocaine, 1 cc bupivacaine injected easily Completed without difficulty  Pain immediately resolved suggesting accurate placement of the medication.  Advised to call if fevers/chills, erythema, induration, drainage, or persistent bleeding.  Images permanently stored and available for review in PACS.  Impression: Technically successful ultrasound guided injection.  Independent interpretation of notes and tests performed by another provider:   None.  Brief History, Exam, Impression, and Recommendations:    Left foot pain This is a pleasant 64 year old female, she has had a long history of pain in her left foot, mostly at the base of the fifth metatarsal, we tried a lateral heel wedge, postop shoe, oral analgesics without much improvement. We obtained an MRI which showed T2 edema in the abductor digiti minimi muscle, we are going to going to an injection today, this time around the distal peroneals and the abductor digiti minimi, return to see me in a month.    ___________________________________________ Gwen Her. Dianah Field, M.D., ABFM., CAQSM. Primary Care and [place] Instructor of [place] of [facility] of Medicine

## 2020-07-17 NOTE — Assessment & Plan Note (Signed)
This is a pleasant 64 year old female, she has had a long history of pain in her left foot, mostly at the base of the fifth metatarsal, we tried a lateral heel wedge, postop shoe, oral analgesics without much improvement. We obtained an MRI which showed T2 edema in the abductor digiti minimi muscle, we are going to going to an injection today, this time around the distal peroneals and the abductor digiti minimi, return to see me in a month.

## 2020-07-20 ENCOUNTER — Ambulatory Visit (INDEPENDENT_AMBULATORY_CARE_PROVIDER_SITE_OTHER): Payer: BC Managed Care – PPO

## 2020-07-20 ENCOUNTER — Other Ambulatory Visit: Payer: Self-pay

## 2020-07-20 DIAGNOSIS — Z1231 Encounter for screening mammogram for malignant neoplasm of breast: Secondary | ICD-10-CM | POA: Diagnosis not present

## 2020-08-03 ENCOUNTER — Ambulatory Visit (INDEPENDENT_AMBULATORY_CARE_PROVIDER_SITE_OTHER): Payer: BC Managed Care – PPO | Admitting: Family Medicine

## 2020-08-03 ENCOUNTER — Encounter: Payer: Self-pay | Admitting: Family Medicine

## 2020-08-03 VITALS — BP 108/55 | HR 65 | Ht 62.0 in | Wt 216.0 lb

## 2020-08-03 DIAGNOSIS — Z01818 Encounter for other preprocedural examination: Secondary | ICD-10-CM

## 2020-08-03 NOTE — Progress Notes (Signed)
Established Patient Office Visit  Subjective:  Patient ID: Stephanie [name], female    DOB: [birth year]  Age: 64 y.o. MRN: [id number]  CC:  Chief Complaint  Patient presents with  . Pre-op Exam    HPI Stephanie [name] presents for pre-op for right knee replacement quiring spinal anesthesia.  Is actually worked really hard to get her weight down so she can get her BMI less than 40.  She is down about 17 pounds.  She has been mostly limiting her caloric intake and increasing her vegetable intake.  She is doing really well in fact she actually just had a complete physical bout 3-1/2 weeks ago.  CMP and lipids look good overall.  Glucose was just borderline so we will check a hemoglobin A1c as well as a hemoglobin today.  She denies any recent chest pain shortness of breath or palpitations or swelling.  No prior history of cardiac disease, pulmonary disease or hypertension or diabetes.  She is never had any problems with anesthesia in the past.  Past Medical History:  Diagnosis Date  . Diverticulitis 01/21/13  . Nephrolithiasis   . Plantar fasciitis     Past Surgical History:  Procedure Laterality Date  . CHOLECYSTECTOMY  1982  . TONSILECTOMY, ADENOIDECTOMY, BILATERAL MYRINGOTOMY AND TUBES     at age 33 approx.   . TUBAL LIGATION  1993    Family History  Problem Relation Age of Onset  . Coronary artery disease Other        family hx of/ heart valve replacement  . Alcohol abuse Father   . Cancer Father   . Prostate cancer Father   . COPD Father   . Hyperlipidemia Father   . Cancer Mother   . Hyperlipidemia Mother   . Colon cancer Neg Hx     Social History   Socioeconomic History  . Marital status: Married    Spouse name: Kimiya Brunelle  . Number of children: 2  . Years of education: HS  . Highest education level: Not on file  Occupational History  . Occupation: BUS DRIVER     Employer: [place]: [employer]  Tobacco Use  .  Smoking status: Former Smoker    Packs/day: 0.20    Years: 2.00    Pack years: 0.40    Types: Cigarettes    Quit date: 10/14/1976    Years since quitting: 43.8  . Smokeless tobacco: Never Used  Vaping Use  . Vaping Use: Never used  Substance and Sexual Activity  . Alcohol use: No  . Drug use: No  . Sexual activity: Yes    Birth control/protection: None  Other Topics Concern  . Not on file  Social History Narrative   2-4 caffeinated drinks ( Mtn Dew)  per day. No regular exercise.    Social Determinants of Health   Financial Resource Strain:   . Difficulty of Paying Living Expenses: Not on file  Food Insecurity:   . Worried About Charity fundraiser in the Last Year: Not on file  . Ran Out of Food in the Last Year: Not on file  Transportation Needs:   . Lack of Transportation (Medical): Not on file  . Lack of Transportation (Non-Medical): Not on file  Physical Activity:   . Days of Exercise per Week: Not on file  . Minutes of Exercise per Session: Not on file  Stress:   . Feeling of Stress : Not  on file  Social Connections:   . Frequency of Communication with Friends and Family: Not on file  . Frequency of Social Gatherings with Friends and Family: Not on file  . Attends Religious Services: Not on file  . Active Member of Clubs or Organizations: Not on file  . Attends Archivist Meetings: Not on file  . Marital Status: Not on file  Intimate Partner Violence:   . Fear of Current or Ex-Partner: Not on file  . Emotionally Abused: Not on file  . Physically Abused: Not on file  . Sexually Abused: Not on file    Outpatient Medications Prior to Visit  Medication Sig Dispense Refill  . aspirin 81 MG chewable tablet Chew by mouth daily.    . Calcium Carbonate-Vitamin D 600-400 MG-UNIT tablet Take 1 tablet by mouth 2 (two) times daily. 60 tablet 11  . gabapentin (NEURONTIN) 300 MG capsule TAKE 1 CAPSULE BY MOUTH THREE TIMES A DAY 90 capsule 3  . ibuprofen (ADVIL)  200 MG tablet Take 200-600 mg by mouth every 8 (eight) hours as needed.    Marland Kitchen CALCIUM + VITAMIN D3 600-10 MG-MCG TABS Take 1 tablet by mouth 2 (two) times daily.  10  . meloxicam (MOBIC) 15 MG tablet Take 15 mg by mouth. (Patient not taking: Reported on 08/03/2020)    . traMADol (ULTRAM) 50 MG tablet  (Patient not taking: Reported on 08/03/2020)     No facility-administered medications prior to visit.    Allergies  Allergen Reactions  . Duloxetine Nausea Only    ROS Review of Systems    Objective:    Physical Exam Constitutional:      Appearance: She is well-developed.  HENT:     Head: Normocephalic and atraumatic.     Right Ear: Tympanic membrane, ear canal and external ear normal.     Left Ear: Tympanic membrane, ear canal and external ear normal.     Nose: Nose normal.     Mouth/Throat:     Mouth: Mucous membranes are moist.     Pharynx: Oropharynx is clear. No oropharyngeal exudate or posterior oropharyngeal erythema.  Eyes:     Conjunctiva/sclera: Conjunctivae normal.     Pupils: Pupils are equal, round, and reactive to light.  Neck:     Thyroid: No thyromegaly.  Cardiovascular:     Rate and Rhythm: Normal rate and regular rhythm.     Heart sounds: Normal heart sounds.  Pulmonary:     Effort: Pulmonary effort is normal.     Breath sounds: Normal breath sounds. No wheezing.  Abdominal:     General: Abdomen is flat. Bowel sounds are normal.     Palpations: Abdomen is soft.  Musculoskeletal:     Cervical back: Neck supple.  Lymphadenopathy:     Cervical: No cervical adenopathy.  Skin:    General: Skin is warm and dry.  Neurological:     Mental Status: She is alert and oriented to person, place, and time.  Psychiatric:        Behavior: Behavior normal.     BP (!) 108/55   Pulse 65   Ht 5\' 2"  (1.575 m)   Wt 216 lb (98 kg)   SpO2 98%   BMI 39.51 kg/m  Wt Readings from Last 3 Encounters:  08/03/20 216 lb (98 kg)  07/17/20 227 lb (103 kg)  07/13/20 231  lb (104.8 kg)     Health Maintenance Due  Topic Date Due  . Hepatitis C Screening  Never done  . HIV Screening  Never done    There are no preventive care reminders to display for this patient.  Lab Results  Component Value Date   TSH 1.356 01/02/2011   No results found for: WBC, HGB, HCT, MCV, PLT Lab Results  Component Value Date   NA 142 07/13/2020   K 4.4 07/13/2020   CO2 28 07/13/2020   GLUCOSE 106 (H) 07/13/2020   BUN 18 07/13/2020   CREATININE 0.72 07/13/2020   BILITOT 0.4 07/13/2020   ALKPHOS 160 (H) 01/02/2011   AST 17 07/13/2020   ALT 22 07/13/2020   PROT 6.7 07/13/2020   ALBUMIN 4.5 01/02/2011   CALCIUM 9.6 07/13/2020   Lab Results  Component Value Date   CHOL 197 07/13/2020   Lab Results  Component Value Date   HDL 47 (L) 07/13/2020   Lab Results  Component Value Date   LDLCALC 120 (H) 07/13/2020   Lab Results  Component Value Date   TRIG 180 (H) 07/13/2020   Lab Results  Component Value Date   CHOLHDL 4.2 07/13/2020   No results found for: HGBA1C    Assessment & Plan:   Problem List Items Addressed This Visit    None    Visit Diagnoses    Preop examination    -  Primary   Relevant Orders   EKG 12-Lead   CBC   Hemoglobin A1c     Preoperative examination-patient is cleared for surgery just pending her hemoglobin A1c results.  She is done a fantastic job in getting her BMI down less than 40.  Her albumin was normal.  And she is not a smoker.   EKG today shows rate of 58 bpm, sinus bradycardia with no acute ST-T wave changes.  She will need to avoid all NSAIDs 1 week prior to surgery.   We will complete form and fax additional information [facility] to [facility] so that she can get scheduled for her knee replacement.  No orders of the defined types were placed in this encounter.   Follow-up: No follow-ups on file.    Beatrice Lecher, MD    [facility]     Component Value Date/Time   NA 142 07/13/2020 1045   K 4.4 07/13/2020 1045    CL 106 07/13/2020 1045   CO2 28 07/13/2020 1045   GLUCOSE 106 (H) 07/13/2020 1045   BUN 18 07/13/2020 1045   CREATININE 0.72 07/13/2020 1045   CALCIUM 9.6 07/13/2020 1045   PROT 6.7 07/13/2020 1045   ALBUMIN 4.5 01/02/2011 0103   AST 17 07/13/2020 1045   ALT 22 07/13/2020 1045   ALKPHOS 160 (H) 01/02/2011 0103   BILITOT 0.4 07/13/2020 1045   GFRNONAA 90 07/13/2020 1045   GFRAA 104 07/13/2020 1045

## 2020-08-04 ENCOUNTER — Telehealth: Payer: Self-pay | Admitting: Neurology

## 2020-08-04 LAB — CBC
HCT: 42.8 % (ref 35.0–45.0)
Hemoglobin: 14.2 g/dL (ref 11.7–15.5)
MCH: 28.9 pg (ref 27.0–33.0)
MCHC: 33.2 g/dL (ref 32.0–36.0)
MCV: 87 fL (ref 80.0–100.0)
MPV: 12.4 fL (ref 7.5–12.5)
Platelets: 178 10*3/uL (ref 140–400)
RBC: 4.92 10*6/uL (ref 3.80–5.10)
RDW: 12.3 % (ref 11.0–15.0)
WBC: 4.8 10*3/uL (ref 3.8–10.8)

## 2020-08-04 LAB — HEMOGLOBIN A1C
Hgb A1c MFr Bld: 5.7 % of total Hgb — ABNORMAL HIGH (ref <5.7)
Mean Plasma Glucose: 117 (calc)
eAG (mmol/L): 6.5 (calc)

## 2020-08-04 NOTE — Telephone Encounter (Signed)
Medical clearance, last office note, ekg, and labs faxed to Dr. Theda Sers in Orthopedics at [phone] with confirmation received.   Copy of clearance to scanning.

## 2020-08-18 ENCOUNTER — Ambulatory Visit: Payer: BC Managed Care – PPO | Admitting: Family Medicine

## 2020-08-18 ENCOUNTER — Ambulatory Visit: Payer: BC Managed Care – PPO | Admitting: Sports Medicine

## 2020-09-22 ENCOUNTER — Ambulatory Visit (INDEPENDENT_AMBULATORY_CARE_PROVIDER_SITE_OTHER): Payer: BC Managed Care – PPO | Admitting: Family Medicine

## 2020-09-22 ENCOUNTER — Other Ambulatory Visit: Payer: Self-pay

## 2020-09-22 VITALS — BP 120/78 | Ht 62.0 in | Wt 201.0 lb

## 2020-09-22 DIAGNOSIS — M7062 Trochanteric bursitis, left hip: Secondary | ICD-10-CM

## 2020-09-22 NOTE — Assessment & Plan Note (Signed)
Pt history and physical exam consistent with trochanteric bursitis of the left hip (pain localized over the left greater trochanter w/ TTP and worse with applying pressure to the left side.) this is likely exacerbated and prolonged by her right knee pain that causes her to put more pressure on the left hip when walking.  Pt has surgery for knee replacement next week, therefore we will not do a steroid injection today.   - hip exercises provided to patient, which she can incorporate into her knee rehabilitation w/ PT.   - pt can continue using tylenol and can apply topical pain relievers as well including voltaren gel, aspercreme, etc.  - follow up with [place] or other sports med provider after rehabilitation is complete if she is continuing to have pain for possible injection.

## 2020-09-22 NOTE — Patient Instructions (Signed)
It was nice to meet you today,  Your pain is caused by trochanteric bursitis due to the strain you are putting on it related to your knee pain on the right side.  We made the following recommendations today:  -You can put ice, heat, use Tylenol, and topical medication such as Voltaren gel or Aspercreme for pain relief.  -We have given you some exercises to do.  You can work on these while you are doing physical therapy for your knee after replacement.  -If you are still having pain after your rehab process and surgery we can consider doing an injection at that time or you can have your other provider perform the injection as well.  have a great day,  Clemetine Marker, MD

## 2020-09-22 NOTE — Progress Notes (Signed)
[place] Attending Note: I have seen and examined this patient. I have discussed this patient with the resident and reviewed the assessment and plan as documented above. I agree with the resident's findings and plan. Left hip greater trochanteric bursitis.  Most likely from antalgic gait secondary to right knee osteoarthritis.  Has planned TKR next week.  With that in mind, I would avoid corticosteroid injection today.  We did give her exercises to start now for hip strengthening.  I urged her to clear these through her orthopedist and if that is okay then she can add them to her rehab program post TKR.  If she does this for 6 weeks and still has no improvement, I would be happy to see her back and consider corticosteroid injection in the right hip.  In the interim she can use topical diclofenac gel which she has for pain relief.

## 2020-09-22 NOTE — Progress Notes (Signed)
    SUBJECTIVE:   CHIEF COMPLAINT / HPI:   L hip pain: Two year ago she started having L hip pain. She has been to urgent care and was told it was bursitis. It has become slightly worse during the past two years.  She cannot lay on the bed on the left side at all or else she will feel a sharp pain.  Sitting on hard surfaces also hurts.  She is taking tylenol but has previously been taking ibuprofen.  She has taken meloxicam as well in the past.  She has voltaren gel but has never used it on the hip.  No snapping or clicking sensations.  Left side does not give out on her. There is always some underlying pain "3/10".  Dr. Madilyn Fireman gave her an injection previously that helped for a week or two. She has right knee replacement surgery next week.    PERTINENT  PMH / PSH: spondylosis, cervical pain, right knee meniscal injury.   OBJECTIVE:   BP 120/78   Ht 5\' 2"  (1.575 m)   Wt 201 lb (91.2 kg)   BMI 36.76 kg/m   General: Alert, oriented.  No acute distress.  Middle-age female, appears stated age. MSK: No tenderness palpation over the right hip.  Tender to palpation over the lateral left hip at the greater trochanter as well as posterior along the gluteus.  Patient has pain in the same area with Corky Sox and FADIR test.  Straight leg test on left side with normal range of motion, but patient endorsed paresthesia below the knee with extension.  Crepitus over the knees bilaterally Gait: pt's left hip laterally displaces when walking.    ASSESSMENT/PLAN:   Trochanteric bursitis of left hip Pt history and physical exam consistent with trochanteric bursitis of the left hip (pain localized over the left greater trochanter w/ TTP and worse with applying pressure to the left side.) this is likely exacerbated and prolonged by her right knee pain that causes her to put more pressure on the left hip when walking.  Pt has surgery for knee replacement next week, therefore we will not do a steroid injection today.    - hip exercises provided to patient, which she can incorporate into her knee rehabilitation w/ PT.   - pt can continue using tylenol and can apply topical pain relievers as well including voltaren gel, aspercreme, etc.  - follow up with [place] or other sports med provider after rehabilitation is complete if she is continuing to have pain for possible injection.       Benay Pike, MD [place]

## 2020-09-27 ENCOUNTER — Ambulatory Visit: Payer: BC Managed Care – PPO | Admitting: Sports Medicine

## 2020-09-28 HISTORY — PX: JOINT REPLACEMENT: SHX530

## 2020-12-12 HISTORY — PX: MANIPULATION KNEE JOINT: SUR841

## 2021-01-03 ENCOUNTER — Ambulatory Visit (INDEPENDENT_AMBULATORY_CARE_PROVIDER_SITE_OTHER): Payer: BC Managed Care – PPO | Admitting: Family Medicine

## 2021-01-03 ENCOUNTER — Other Ambulatory Visit: Payer: Self-pay

## 2021-01-03 VITALS — BP 115/66 | HR 83 | Resp 20 | Ht 62.0 in | Wt 201.0 lb

## 2021-01-03 DIAGNOSIS — Z23 Encounter for immunization: Secondary | ICD-10-CM | POA: Diagnosis not present

## 2021-01-03 NOTE — Progress Notes (Signed)
Agree with documentation as above.   Cormac Wint, MD  

## 2021-01-03 NOTE — Progress Notes (Signed)
Established Patient Office Visit  Subjective:  Patient ID: Stephanie [name], female    DOB: July 06, 1956  Age: 65 y.o. MRN: [id number]  CC:  Chief Complaint  Patient presents with  . Immunizations    HPI Stephanie [name] presents for her second Shingrix vaccine. Injection given in right deltoid without complications. Pt tolerated well.   Past Medical History:  Diagnosis Date  . Diverticulitis 01/21/13  . Nephrolithiasis   . Plantar fasciitis     Past Surgical History:  Procedure Laterality Date  . CHOLECYSTECTOMY  1982  . TONSILECTOMY, ADENOIDECTOMY, BILATERAL MYRINGOTOMY AND TUBES     at age 89 approx.   . TUBAL LIGATION  1993    Family History  Problem Relation Age of Onset  . Coronary artery disease Other        family hx of/ heart valve replacement  . Alcohol abuse Father   . Cancer Father   . Prostate cancer Father   . COPD Father   . Hyperlipidemia Father   . Cancer Mother   . Hyperlipidemia Mother   . Colon cancer Neg Hx     Social History   Socioeconomic History  . Marital status: Married    Spouse name: Magaline Steinberg  . Number of children: 2  . Years of education: HS  . Highest education level: Not on file  Occupational History  . Occupation: BUS DRIVER     Employer: [place]: [employer]  Tobacco Use  . Smoking status: Former Smoker    Packs/day: 0.20    Years: 2.00    Pack years: 0.40    Types: Cigarettes    Quit date: 10/14/1976    Years since quitting: 44.2  . Smokeless tobacco: Never Used  Vaping Use  . Vaping Use: Never used  Substance and Sexual Activity  . Alcohol use: No  . Drug use: No  . Sexual activity: Yes    Birth control/protection: None  Other Topics Concern  . Not on file  Social History Narrative   2-4 caffeinated drinks ( Mtn Dew)  per day. No regular exercise.    Social Determinants of Health   Financial Resource Strain: Not on file  Food Insecurity: Not on file   Transportation Needs: Not on file  Physical Activity: Not on file  Stress: Not on file  Social Connections: Not on file  Intimate Partner Violence: Not on file    Outpatient Medications Prior to Visit  Medication Sig Dispense Refill  . aspirin 81 MG chewable tablet Chew by mouth daily.    . Calcium Carbonate-Vitamin D 600-400 MG-UNIT tablet Take 1 tablet by mouth 2 (two) times daily. 60 tablet 11  . ibuprofen (ADVIL) 200 MG tablet Take 200-600 mg by mouth every 8 (eight) hours as needed.     No facility-administered medications prior to visit.    Allergies  Allergen Reactions  . Duloxetine Nausea Only    ROS Review of Systems    Objective:    Physical Exam  BP 115/66 (BP Location: Left Arm, Patient Position: Sitting, Cuff Size: Normal)   Pulse 83   Resp 20   Ht 5\' 2"  (1.575 m)   Wt 201 lb (91.2 kg)   SpO2 97%   BMI 36.76 kg/m  Wt Readings from Last 3 Encounters:  01/03/21 201 lb (91.2 kg)  09/22/20 201 lb (91.2 kg)  08/03/20 216 lb (98 kg)     Health Maintenance Due  Topic Date Due  . Hepatitis C Screening  Never done  . HIV Screening  Never done  . COVID-19 Vaccine (3 - Booster for Pfizer series) 08/04/2020    There are no preventive care reminders to display for this patient.  Lab Results  Component Value Date   TSH 1.356 01/02/2011   Lab Results  Component Value Date   WBC 4.8 08/03/2020   HGB 14.2 08/03/2020   HCT 42.8 08/03/2020   MCV 87.0 08/03/2020   PLT 178 08/03/2020   Lab Results  Component Value Date   NA 142 07/13/2020   K 4.4 07/13/2020   CO2 28 07/13/2020   GLUCOSE 106 (H) 07/13/2020   BUN 18 07/13/2020   CREATININE 0.72 07/13/2020   BILITOT 0.4 07/13/2020   ALKPHOS 160 (H) 01/02/2011   AST 17 07/13/2020   ALT 22 07/13/2020   PROT 6.7 07/13/2020   ALBUMIN 4.5 01/02/2011   CALCIUM 9.6 07/13/2020   Lab Results  Component Value Date   CHOL 197 07/13/2020   Lab Results  Component Value Date   HDL 47 (L) 07/13/2020    Lab Results  Component Value Date   LDLCALC 120 (H) 07/13/2020   Lab Results  Component Value Date   TRIG 180 (H) 07/13/2020   Lab Results  Component Value Date   CHOLHDL 4.2 07/13/2020   Lab Results  Component Value Date   HGBA1C 5.7 (H) 08/03/2020      Assessment & Plan:  Injection given in right deltoid without complications. Pt tolerated well.  Problem List Items Addressed This Visit   None   Visit Diagnoses    Need for shingles vaccine    -  Primary   Relevant Orders   Varicella-zoster vaccine IM (Shingrix) (Completed)      No orders of the defined types were placed in this encounter.   Follow-up: No follow-ups on file.    Ninfa Meeker, CMA

## 2021-03-25 ENCOUNTER — Other Ambulatory Visit: Payer: Self-pay | Admitting: Sports Medicine

## 2021-03-25 DIAGNOSIS — M79672 Pain in left foot: Secondary | ICD-10-CM

## 2021-03-25 MED ORDER — CALCIUM CARBONATE-VITAMIN D 600-400 MG-UNIT PO TABS: 1.0000 | ORAL_TABLET | Freq: Two times a day (BID) | ORAL | 3 refills | Status: DC

## 2021-07-19 ENCOUNTER — Other Ambulatory Visit: Payer: Self-pay | Admitting: Family Medicine

## 2021-07-19 DIAGNOSIS — Z1231 Encounter for screening mammogram for malignant neoplasm of breast: Secondary | ICD-10-CM

## 2021-07-26 ENCOUNTER — Ambulatory Visit: Payer: BC Managed Care – PPO

## 2021-08-01 ENCOUNTER — Ambulatory Visit (INDEPENDENT_AMBULATORY_CARE_PROVIDER_SITE_OTHER): Payer: BC Managed Care – PPO

## 2021-08-01 ENCOUNTER — Other Ambulatory Visit: Payer: Self-pay

## 2021-08-01 DIAGNOSIS — Z1231 Encounter for screening mammogram for malignant neoplasm of breast: Secondary | ICD-10-CM

## 2021-08-06 NOTE — Progress Notes (Signed)
Please call patient. Normal mammogram.  Repeat in 1 year.  

## 2021-08-21 ENCOUNTER — Telehealth: Payer: Self-pay | Admitting: Family Medicine

## 2021-08-21 NOTE — Telephone Encounter (Signed)
Yes she will need to schedule a pre-op with med.  I have not seen her in a year.

## 2021-08-21 NOTE — Telephone Encounter (Signed)
Pt called. She's getting knee surgery and wants to know if Dr Madilyn Fireman got the form to  complete from the Orthopedic surgeon's office.She needs this form completed before Nov 30th. I've also asked pt to have that office to refax the form.

## 2021-08-22 NOTE — Telephone Encounter (Signed)
Pt scheduled for 09/04/2021.  Charyl Bigger, CMA

## 2021-08-23 NOTE — Telephone Encounter (Signed)
Thank you :)

## 2021-09-04 ENCOUNTER — Encounter: Payer: Self-pay | Admitting: Family Medicine

## 2021-09-04 ENCOUNTER — Ambulatory Visit: Payer: BC Managed Care – PPO | Admitting: Family Medicine

## 2021-09-04 ENCOUNTER — Other Ambulatory Visit: Payer: Self-pay

## 2021-09-04 VITALS — BP 115/42 | HR 62 | Ht 62.0 in | Wt 202.0 lb

## 2021-09-04 DIAGNOSIS — Z01818 Encounter for other preprocedural examination: Secondary | ICD-10-CM

## 2021-09-04 NOTE — Progress Notes (Signed)
Established Patient Office Visit  Subjective:  Patient ID: Stephanie [name], female    DOB: 1956-04-27  Age: 65 y.o. MRN: [id number]  CC:  Chief Complaint  Patient presents with   Pre-op Exam    R knee revision 12/12 Dr. 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 Paralee Cancel.    She is otherwise doing well.  No recent chest pain or shortness of breath.  No significant new onset lower extremity swelling.  She has not had any prior difficulties or complications from anesthesia.  She denies any excess bruising or bleeding she has not had any bleeding problems during surgery.  Her Tdap is up-to-date last given in February 2017.  He says that she will have blood work done at her preop most likely including a CBC and CMP, and blood type and screen.  She reports that without her current knee limitations that she would easily be able to walk 3 city blocks without shortness of breath or chest pain.  No history of cardiac or pulmonary disease.  Currently only taking gabapentin.  Not on any blood thinners.  Past Medical History:  Diagnosis Date   Diverticulitis 01/21/13   Nephrolithiasis    Plantar fasciitis     Past Surgical History:  Procedure Laterality Date   CHOLECYSTECTOMY  1982   TONSILECTOMY, ADENOIDECTOMY, BILATERAL MYRINGOTOMY AND TUBES     at age 75 approx.    TUBAL LIGATION  1993    Family History  Problem Relation Age of Onset   Coronary artery disease Other        family hx of/ heart valve replacement   Alcohol abuse Father    Cancer Father    Prostate cancer Father    COPD Father    Hyperlipidemia Father    Cancer Mother    Hyperlipidemia Mother    Colon cancer Neg Hx     Social History   Socioeconomic History   Marital status: Married    Spouse name: Keyla Milone   Number of children: 2   Years of education: HS   Highest education level: Not on file   Occupational History   Occupation: BUS DRIVER     Employer: [employer]: [place]  Tobacco Use   Smoking status: Former    Packs/day: 0.20    Years: 2.00    Pack years: 0.40    Types: Cigarettes    Quit date: 10/14/1976    Years since quitting: 44.9   Smokeless tobacco: Never  Vaping Use   Vaping Use: Never used  Substance and Sexual Activity   Alcohol use: No   Drug use: No   Sexual activity: Yes    Birth control/protection: None  Other Topics Concern   Not on file  Social History Narrative   2-4 caffeinated drinks ( Mtn Dew)  per day. No regular exercise.    Social Determinants of Health   Financial Resource Strain: Not on file  Food Insecurity: Not on file  Transportation Needs: Not on file  Physical Activity: Not on file  Stress: Not on file  Social Connections: Not on file  Intimate Partner Violence: Not on file    Outpatient Medications Prior to Visit  Medication Sig Dispense Refill   gabapentin (NEURONTIN) 300 MG capsule Take 300 mg by mouth in the morning and at bedtime.  Ibuprofen (ADVIL) 200 MG CAPS Take by mouth as needed.     gabapentin (NEURONTIN) 300 MG capsule Take 1 capsule by mouth in the morning, at noon, and at bedtime.     aspirin 81 MG chewable tablet Chew by mouth daily.     Calcium Carbonate-Vitamin D 600-400 MG-UNIT tablet Take 1 tablet by mouth 2 (two) times daily. 180 tablet 3   ibuprofen (ADVIL) 200 MG tablet Take 200-600 mg by mouth every 8 (eight) hours as needed.     No facility-administered medications prior to visit.    Allergies  Allergen Reactions   Duloxetine Nausea Only    ROS Review of Systems    Objective:    Physical Exam Vitals reviewed.  Constitutional:      Appearance: Normal appearance. She is well-developed.  HENT:     Head: Normocephalic and atraumatic.     Right Ear: Tympanic membrane, ear canal and external ear normal.     Left Ear: Tympanic membrane, ear canal  and external ear normal.     Nose: Nose normal.     Mouth/Throat:     Mouth: Mucous membranes are moist.     Pharynx: Oropharynx is clear. No oropharyngeal exudate or posterior oropharyngeal erythema.  Eyes:     Conjunctiva/sclera: Conjunctivae normal.     Pupils: Pupils are equal, round, and reactive to light.  Neck:     Thyroid: No thyromegaly.     Vascular: No carotid bruit.  Cardiovascular:     Rate and Rhythm: Normal rate and regular rhythm.     Heart sounds: Normal heart sounds.  Pulmonary:     Effort: Pulmonary effort is normal.     Breath sounds: Normal breath sounds. No wheezing.  Abdominal:     General: Abdomen is flat. Bowel sounds are normal. There is no distension.     Palpations: Abdomen is soft. There is no mass.     Tenderness: There is no abdominal tenderness.  Musculoskeletal:        General: No swelling.     Cervical back: Neck supple.  Lymphadenopathy:     Cervical: No cervical adenopathy.  Skin:    General: Skin is warm and dry.  Neurological:     Mental Status: She is alert and oriented to person, place, and time.  Psychiatric:        Mood and Affect: Mood normal.        Behavior: Behavior normal.    BP (!) 115/42   Pulse 62   Ht 5\' 2"  (1.575 m)   Wt 202 lb (91.6 kg)   SpO2 100%   BMI 36.95 kg/m  Wt Readings from Last 3 Encounters:  09/04/21 202 lb (91.6 kg)  01/03/21 201 lb (91.2 kg)  09/22/20 201 lb (91.2 kg)     Health Maintenance Due  Topic Date Due   HIV Screening  Never done   Hepatitis C Screening  Never done    There are no preventive care reminders to display for this patient.  Lab Results  Component Value Date   TSH 1.356 01/02/2011   Lab Results  Component Value Date   WBC 4.8 08/03/2020   HGB 14.2 08/03/2020   HCT 42.8 08/03/2020   MCV 87.0 08/03/2020   PLT 178 08/03/2020   Lab Results  Component Value Date   NA 142 07/13/2020   K 4.4 07/13/2020   CO2 28 07/13/2020   GLUCOSE 106 (H) 07/13/2020   BUN 18  07/13/2020  CREATININE 0.72 07/13/2020   BILITOT 0.4 07/13/2020   ALKPHOS 160 (H) 01/02/2011   AST 17 07/13/2020   ALT 22 07/13/2020   PROT 6.7 07/13/2020   ALBUMIN 4.5 01/02/2011   CALCIUM 9.6 07/13/2020   Lab Results  Component Value Date   CHOL 197 07/13/2020   Lab Results  Component Value Date   HDL 47 (L) 07/13/2020   Lab Results  Component Value Date   LDLCALC 120 (H) 07/13/2020   Lab Results  Component Value Date   TRIG 180 (H) 07/13/2020   Lab Results  Component Value Date   CHOLHDL 4.2 07/13/2020   Lab Results  Component Value Date   HGBA1C 5.7 (H) 08/03/2020      Assessment & Plan:   Problem List Items Addressed This Visit   None Visit Diagnoses     Preop general physical exam    -  Primary   Relevant Orders   Lipid Panel w/reflex Direct LDL   Hepatitis C Antibody   Hemoglobin A1c      She is cleared for surgery based on history and exam and EKG testing from today.  She is overall low risk.  Blood work will be drawn during her preop at the hospital and so she declined to do it today.  Though I would like to get an up-to-date lipid panel and A1c on her in the next couple months if possible.  She is not currently on any blood thinning medications.  Continue to avoid any aspirin, fish oil etc. 2 weeks prior to surgery.  EKG  shows rate of 58 bpm, sinus bradycardia.  No acute ST-T wave changes.  Inverted T wave in lead III.  No change from prior EKG in 2021.   No orders of the defined types were placed in this encounter.   Follow-up: No follow-ups on file.    Beatrice Lecher, MD

## 2021-09-05 LAB — LIPID PANEL W/REFLEX DIRECT LDL
Cholesterol: 204 mg/dL — ABNORMAL HIGH (ref <200)
HDL: 54 mg/dL (ref >=50)
LDL Cholesterol (Calc): 127 mg/dL (calc) — ABNORMAL HIGH
Non-HDL Cholesterol (Calc): 150 mg/dL (calc) — ABNORMAL HIGH (ref <130)
Total CHOL/HDL Ratio: 3.8 (calc) (ref <5.0)
Triglycerides: 120 mg/dL (ref <150)

## 2021-09-05 LAB — HEPATITIS C ANTIBODY
Hepatitis C Ab: NONREACTIVE
SIGNAL TO CUT-OFF: 0.04 (ref <1.00)

## 2021-09-05 LAB — HEMOGLOBIN A1C
Hgb A1c MFr Bld: 5.6 % of total Hgb (ref <5.7)
Mean Plasma Glucose: 114 mg/dL
eAG (mmol/L): 6.3 mmol/L

## 2021-09-10 NOTE — Progress Notes (Signed)
Stephanie [name], your LDL is still mildly elevated similar to the last couple of years.  Just continue work on [employer] and regular exercise.  Negative for hepatitis C.  Your A1c is 5.6 which is great.  Good luck with your surgery I hope everything goes well.

## 2021-09-12 ENCOUNTER — Encounter (HOSPITAL_COMMUNITY): Admission: RE | Admit: 2021-09-12 | Payer: BC Managed Care – PPO | Source: Ambulatory Visit

## 2021-09-17 NOTE — Patient Instructions (Signed)
DUE TO COVID-19 ONLY ONE VISITOR IS ALLOWED TO COME WITH YOU AND STAY IN THE WAITING ROOM ONLY DURING PRE OP AND PROCEDURE DAY OF SURGERY IF YOU ARE GOING HOME AFTER SURGERY. IF YOU ARE SPENDING THE NIGHT 2 PEOPLE MAY VISIT WITH YOU IN YOUR PRIVATE ROOM AFTER SURGERY UNTIL VISITING  HOURS ARE OVER AT 800 PM AND 1  VISITOR  MAY  SPEND THE NIGHT.   YOU NEED TO HAVE A COVID 19 TEST ON__12/8_THIS TEST MUST BE DONE BEFORE SURGERY,  COVID TESTING SITE  IS LOCATED AT [place], . REMAIN IN YOUR CAR THIS IS A DRIVE UP TEST. AFTER YOUR COVID TEST PLEASE WEAR A MASK OUT IN PUBLIC AND SOCIAL DISTANCE AND [place] YOUR HANDS FREQUENTLY, ALSO ASK ALL YOUR CLOSE CONTACT PERSONS TO WEAR A MASK AND SOCIAL DISTANCE AND [place] THEIR HANDS FREQUENTLY ALSO.               [name]     Your procedure is scheduled on: 09/24/21   Report to [facility] Main  Entrance   Report to admitting at  9:40 AM     Call this number if you have problems the morning of surgery [phone]    No food after midnight.    You may have clear liquid until 9:25 AM.    At 9:00 AM drink pre surgery drink.   Nothing by mouth after 9:25 AM.   CLEAR LIQUID DIET   Foods Allowed                                                                     Foods Excluded  Coffee and tea, regular and decaf                             liquids that you cannot  Plain Jell-O any favor except red or purple                                           see through such as: Fruit ices (not with fruit pulp)                                     milk, soups, orange juice  Iced Popsicles                                    All solid food Carbonated beverages, regular and diet                                    Cranberry, grape and apple juices Sports drinks like Gatorade Lightly seasoned clear broth or consume(fat free) Sugar     BRUSH YOUR TEETH MORNING OF SURGERY AND RINSE YOUR MOUTH OUT, NO CHEWING GUM CANDY OR  MINTS.     Take these medicines the morning of surgery with  A SIP OF WATER: Gabapentin                                You may not have any metal on your body including hair pins and              piercings  Do not wear jewelry, make-up, lotions, powders or perfumes, deodorant             Do not wear nail polish on your fingernails.  Do not shave  48 hours prior to surgery.                Do not bring valuables to the hospital. [place].  Contacts, dentures or bridgework may not be worn into surgery.  Leave suitcase in the car. After surgery it may be brought to your room.                 [place] - Preparing for Surgery Before surgery, you can play an important role.  Because skin is not sterile, your skin needs to be as free of germs as possible.  You can reduce the number of germs on your skin by washing with CHG (chlorahexidine gluconate) soap before surgery.  CHG is an antiseptic cleaner which kills germs and bonds with the skin to continue killing germs even after washing. Please DO NOT use if you have an allergy to CHG or antibacterial soaps.  If your skin becomes reddened/irritated stop using the CHG and inform your nurse when you arrive at Short Stay. Do not shave (including legs and underarms) for at least 48 hours prior to the first CHG shower.   Please follow these instructions carefully:  1.  Shower with CHG Soap the night before surgery and the  morning of Surgery.  2.  If you choose to wash your hair, wash your hair first as usual with your  normal  shampoo.  3.  After you shampoo, rinse your hair and body thoroughly to remove the  shampoo.                            4.  Use CHG as you would any other liquid soap.  You can apply chg directly  to the skin and wash                       Gently with a scrungie or clean washcloth.  5.  Apply the CHG Soap to your body ONLY FROM THE NECK DOWN.   Do not use on face/ open                            Wound or open sores. Avoid contact with eyes, ears mouth and genitals (private parts).                       Wash face,  Genitals (private parts) with your normal soap.             6.  Wash thoroughly, paying special attention to the area where your surgery  will be performed.  7.  Thoroughly rinse your body with warm water from the neck down.  8.  DO NOT shower/wash  with your normal soap after using and rinsing off  the CHG Soap.                9.  Pat yourself dry with a clean towel.            10.  Wear clean pajamas.            11.  Place clean sheets on your bed the night of your first shower and do not  sleep with pets. Day of Surgery : Do not apply any lotions/deodorants the morning of surgery.  Please wear clean clothes to the hospital/surgery center.  FAILURE TO FOLLOW THESE INSTRUCTIONS MAY RESULT IN THE CANCELLATION OF YOUR SURGERY PATIENT SIGNATURE_________________________________  NURSE SIGNATURE__________________________________  ________________________________________________________________________   Adam Phenix  An incentive spirometer is a tool that can help keep your lungs clear and active. This tool measures how well you are filling your lungs with each breath. Taking long deep breaths may help reverse or decrease the chance of developing breathing (pulmonary) problems (especially infection) following: A long period of time when you are unable to move or be active. BEFORE THE PROCEDURE  If the spirometer includes an indicator to show your best effort, your nurse or respiratory therapist will set it to a desired goal. If possible, sit up straight or lean slightly forward. Try not to slouch. Hold the incentive spirometer in an upright position. INSTRUCTIONS FOR USE  Sit on the edge of your bed if possible, or sit up as far as you can in bed or on a chair. Hold the incentive spirometer in an upright position. Breathe out normally. Place the  mouthpiece in your mouth and seal your lips tightly around it. Breathe in slowly and as deeply as possible, raising the piston or the ball toward the top of the column. Hold your breath for 3-5 seconds or for as long as possible. Allow the piston or ball to fall to the bottom of the column. Remove the mouthpiece from your mouth and breathe out normally. Rest for a few seconds and repeat Steps 1 through 7 at least 10 times every 1-2 hours when you are awake. Take your time and take a few normal breaths between deep breaths. The spirometer may include an indicator to show your best effort. Use the indicator as a goal to work toward during each repetition. After each set of 10 deep breaths, practice coughing to be sure your lungs are clear. If you have an incision (the cut made at the time of surgery), support your incision when coughing by placing a pillow or rolled up towels firmly against it. Once you are able to get out of bed, walk around indoors and cough well. You may stop using the incentive spirometer when instructed by your caregiver.  RISKS AND COMPLICATIONS Take your time so you do not get dizzy or light-headed. If you are in pain, you may need to take or ask for pain medication before doing incentive spirometry. It is harder to take a deep breath if you are having pain. AFTER USE Rest and breathe slowly and easily. It can be helpful to keep track of a log of your progress. Your caregiver can provide you with a simple table to help with this. If you are using the spirometer at home, follow these instructions: [place] IF:  You are having difficultly using the spirometer. You have trouble using the spirometer as often as instructed. Your pain medication is not giving enough relief  while using the spirometer. You develop fever of 100.5 F (38.1 C) or higher. SEEK IMMEDIATE MEDICAL CARE IF:  You cough up bloody sputum that had not been present before. You develop fever of 102 F  (38.9 C) or greater. You develop worsening pain at or near the incision site. MAKE SURE YOU:  Understand these instructions. Will watch your condition. Will get help right away if you are not doing well or get worse. Document Released: 02/10/2007 Document Revised: 12/23/2011 Document Reviewed: 04/13/2007 [facility] Patient Information 2014 [place], [place].   ________________________________________________________________________

## 2021-09-18 ENCOUNTER — Encounter (HOSPITAL_COMMUNITY)
Admission: RE | Admit: 2021-09-18 | Discharge: 2021-09-18 | Disposition: A | Discharge status: Home / Self Care | Payer: BC Managed Care – PPO | Source: Ambulatory Visit | Attending: Orthopedic Surgery | Admitting: Orthopedic Surgery

## 2021-09-18 ENCOUNTER — Encounter (HOSPITAL_COMMUNITY): Payer: Self-pay

## 2021-09-18 ENCOUNTER — Other Ambulatory Visit: Payer: Self-pay

## 2021-09-18 VITALS — BP 121/62 | HR 71 | Temp 98.1°F | Resp 18 | Ht 62.0 in | Wt 202.0 lb

## 2021-09-18 DIAGNOSIS — Y792 Prosthetic and other implants, materials and accessory orthopedic devices associated with adverse incidents: Secondary | ICD-10-CM | POA: Insufficient documentation

## 2021-09-18 DIAGNOSIS — Z01818 Encounter for other preprocedural examination: Secondary | ICD-10-CM

## 2021-09-18 DIAGNOSIS — T84012A Broken internal right knee prosthesis, initial encounter: Secondary | ICD-10-CM | POA: Diagnosis not present

## 2021-09-18 DIAGNOSIS — Y838 Other surgical procedures as the cause of abnormal reaction of the patient, or of later complication, without mention of misadventure at the time of the procedure: Secondary | ICD-10-CM | POA: Diagnosis not present

## 2021-09-18 DIAGNOSIS — Z01812 Encounter for preprocedural laboratory examination: Secondary | ICD-10-CM | POA: Diagnosis present

## 2021-09-18 HISTORY — DX: Personal history of urinary calculi: Z87.442

## 2021-09-18 LAB — COMPREHENSIVE METABOLIC PANEL
ALT: 15 U/L (ref 0–44)
AST: 17 U/L (ref 15–41)
Albumin: 4.2 g/dL (ref 3.5–5.0)
Alkaline Phosphatase: 132 U/L — ABNORMAL HIGH (ref 38–126)
Anion gap: 7 (ref 5–15)
BUN: 18 mg/dL (ref 8–23)
CO2: 25 mmol/L (ref 22–32)
Calcium: 9.1 mg/dL (ref 8.9–10.3)
Chloride: 108 mmol/L (ref 98–111)
Creatinine, Ser: 0.61 mg/dL (ref 0.44–1.00)
GFR, Estimated: >60 mL/min (ref >60)
Glucose, Bld: 103 mg/dL — ABNORMAL HIGH (ref 70–99)
Potassium: 3.8 mmol/L (ref 3.5–5.1)
Sodium: 140 mmol/L (ref 135–145)
Total Bilirubin: 0.5 mg/dL (ref 0.3–1.2)
Total Protein: 7 g/dL (ref 6.5–8.1)

## 2021-09-18 LAB — CBC
HCT: 40.9 % (ref 36.0–46.0)
Hemoglobin: 12.8 g/dL (ref 12.0–15.0)
MCH: 27.3 pg (ref 26.0–34.0)
MCHC: 31.3 g/dL (ref 30.0–36.0)
MCV: 87.2 fL (ref 80.0–100.0)
Platelets: 178 10*3/uL (ref 150–400)
RBC: 4.69 MIL/uL (ref 3.87–5.11)
RDW: 12.8 % (ref 11.5–15.5)
WBC: 4.7 10*3/uL (ref 4.0–10.5)
nRBC: 0 % (ref 0.0–0.2)

## 2021-09-18 LAB — SURGICAL PCR SCREEN
MRSA, PCR: NEGATIVE
Staphylococcus aureus: NEGATIVE

## 2021-09-18 NOTE — Addendum Note (Signed)
Addended by: Narda Rutherford on: 09/18/2021 07:53 AM   Modules accepted: Orders

## 2021-09-18 NOTE — Progress Notes (Signed)
COVID test-12/8  PCP - Dr. Peterson Ao Cardiologist - no  Chest x-ray - no EKG - chart Stress Test - no ECHO - no Cardiac Cath - no Pacemaker/ICD device last checked:NA  Sleep Study - yes-negative results CPAP -   Fasting Blood Sugar -  Checks Blood Sugar _____ times a day  Blood Thinner Instructions:NA Aspirin Instructions: Last Dose:  Anesthesia review: no  Patient denies shortness of breath, fever, cough and chest pain at PAT appointment Pt has no SOB with any activities.  Patient verbalized understanding of instructions that were given to them at the PAT appointment. Patient was also instructed that they will need to review over the PAT instructions again at home before surgery. yes

## 2021-09-20 ENCOUNTER — Other Ambulatory Visit: Payer: Self-pay | Admitting: Orthopedic Surgery

## 2021-09-20 LAB — SARS CORONAVIRUS 2 (TAT 6-24 HRS): SARS Coronavirus 2: NEGATIVE

## 2021-09-24 ENCOUNTER — Ambulatory Visit (HOSPITAL_COMMUNITY): Payer: BC Managed Care – PPO | Admitting: Anesthesiology

## 2021-09-24 ENCOUNTER — Encounter (HOSPITAL_COMMUNITY): Payer: Self-pay | Admitting: Orthopedic Surgery

## 2021-09-24 ENCOUNTER — Other Ambulatory Visit: Payer: Self-pay

## 2021-09-24 ENCOUNTER — Observation Stay (HOSPITAL_COMMUNITY)
Admission: AD | Admit: 2021-09-24 | Discharge: 2021-09-27 | Disposition: A | Discharge status: Home / Self Care | Payer: BC Managed Care – PPO | Attending: Orthopedic Surgery | Admitting: Orthopedic Surgery

## 2021-09-24 ENCOUNTER — Encounter (HOSPITAL_COMMUNITY)
Admission: AD | Disposition: A | Discharge status: Home / Self Care | Payer: Self-pay | Source: Home / Self Care | Attending: Orthopedic Surgery

## 2021-09-24 DIAGNOSIS — T8484XA Pain due to internal orthopedic prosthetic devices, implants and grafts, initial encounter: Principal | ICD-10-CM | POA: Insufficient documentation

## 2021-09-24 DIAGNOSIS — M25661 Stiffness of right knee, not elsewhere classified: Secondary | ICD-10-CM | POA: Diagnosis not present

## 2021-09-24 DIAGNOSIS — Y722 Prosthetic and other implants, materials and accessory otorhinolaryngological devices associated with adverse incidents: Secondary | ICD-10-CM | POA: Diagnosis not present

## 2021-09-24 DIAGNOSIS — Z79899 Other long term (current) drug therapy: Secondary | ICD-10-CM | POA: Diagnosis not present

## 2021-09-24 DIAGNOSIS — R203 Hyperesthesia: Secondary | ICD-10-CM | POA: Diagnosis not present

## 2021-09-24 DIAGNOSIS — Z87891 Personal history of nicotine dependence: Secondary | ICD-10-CM | POA: Insufficient documentation

## 2021-09-24 DIAGNOSIS — T84012A Broken internal right knee prosthesis, initial encounter: Secondary | ICD-10-CM

## 2021-09-24 DIAGNOSIS — Z96651 Presence of right artificial knee joint: Secondary | ICD-10-CM | POA: Insufficient documentation

## 2021-09-24 HISTORY — PX: TOTAL KNEE REVISION: SHX996

## 2021-09-24 LAB — TYPE AND SCREEN
ABO/RH(D): B POS
Antibody Screen: NEGATIVE

## 2021-09-24 LAB — ABO/RH: ABO/RH(D): B POS

## 2021-09-24 SURGERY — TOTAL KNEE REVISION
Anesthesia: Spinal | Site: Knee | Laterality: Right

## 2021-09-24 MED ORDER — LIDOCAINE HCL (CARDIAC) PF 100 MG/5ML IV SOSY
PREFILLED_SYRINGE | INTRAVENOUS | Status: DC | PRN
  Administered 2021-09-24: 40 mg via INTRAVENOUS

## 2021-09-24 MED ORDER — PHENYLEPHRINE HCL (PRESSORS) 10 MG/ML IV SOLN
INTRAVENOUS | Status: AC
  Filled 2021-09-24: qty 1

## 2021-09-24 MED ORDER — KETOROLAC TROMETHAMINE 30 MG/ML IJ SOLN
INTRAMUSCULAR | Status: AC
  Filled 2021-09-24: qty 1

## 2021-09-24 MED ORDER — BISACODYL 10 MG RE SUPP: 10.0000 mg | Freq: Every day | RECTAL | Status: DC | PRN

## 2021-09-24 MED ORDER — TOBRAMYCIN SULFATE 1.2 G IJ SOLR
INTRAMUSCULAR | Status: AC
  Filled 2021-09-24: qty 1.2

## 2021-09-24 MED ORDER — PROPOFOL 500 MG/50ML IV EMUL
INTRAVENOUS | Status: DC | PRN
  Administered 2021-09-24: 50 ug/kg/min via INTRAVENOUS

## 2021-09-24 MED ORDER — METOCLOPRAMIDE HCL 5 MG/ML IJ SOLN: 5.0000 mg | Freq: Three times a day (TID) | INTRAMUSCULAR | Status: DC | PRN

## 2021-09-24 MED ORDER — METHOCARBAMOL 500 MG IVPB - SIMPLE MED
INTRAVENOUS | Status: AC
  Filled 2021-09-24: qty 50

## 2021-09-24 MED ORDER — ROPIVACAINE HCL 7.5 MG/ML IJ SOLN
INTRAMUSCULAR | Status: DC | PRN
  Administered 2021-09-24: 20 mL via PERINEURAL

## 2021-09-24 MED ORDER — TRANEXAMIC ACID-NACL 1000-0.7 MG/100ML-% IV SOLN
1000.0000 mg | Freq: Once | INTRAVENOUS | Status: AC
  Administered 2021-09-24: 1000 mg via INTRAVENOUS
  Filled 2021-09-24: qty 100

## 2021-09-24 MED ORDER — POLYETHYLENE GLYCOL 3350 17 G PO PACK
17.0000 g | PACK | Freq: Every day | ORAL | Status: DC | PRN
  Administered 2021-09-26: 08:00:00 17 g via ORAL
  Filled 2021-09-24: qty 1

## 2021-09-24 MED ORDER — FERROUS SULFATE 325 (65 FE) MG PO TABS
325.0000 mg | ORAL_TABLET | Freq: Three times a day (TID) | ORAL | Status: DC
  Administered 2021-09-25 – 2021-09-27 (×7): 325 mg via ORAL
  Filled 2021-09-24 (×7): qty 1

## 2021-09-24 MED ORDER — HYDROMORPHONE HCL 1 MG/ML IJ SOLN: 0.5000 mg | INTRAMUSCULAR | Status: DC | PRN

## 2021-09-24 MED ORDER — DEXAMETHASONE SODIUM PHOSPHATE 10 MG/ML IJ SOLN: 8.0000 mg | Freq: Once | INTRAMUSCULAR | Status: AC

## 2021-09-24 MED ORDER — CEFAZOLIN SODIUM-DEXTROSE 2-4 GM/100ML-% IV SOLN
2.0000 g | INTRAVENOUS | Status: AC
  Administered 2021-09-24: 2 g via INTRAVENOUS
  Filled 2021-09-24: qty 100

## 2021-09-24 MED ORDER — DOCUSATE SODIUM 100 MG PO CAPS
100.0000 mg | ORAL_CAPSULE | Freq: Two times a day (BID) | ORAL | Status: DC
  Administered 2021-09-24 – 2021-09-27 (×6): 100 mg via ORAL
  Filled 2021-09-24 (×6): qty 1

## 2021-09-24 MED ORDER — CEFAZOLIN SODIUM-DEXTROSE 2-4 GM/100ML-% IV SOLN
2.0000 g | Freq: Four times a day (QID) | INTRAVENOUS | Status: AC
  Administered 2021-09-24 – 2021-09-25 (×2): 2 g via INTRAVENOUS
  Filled 2021-09-24 (×2): qty 100

## 2021-09-24 MED ORDER — AMISULPRIDE (ANTIEMETIC) 5 MG/2ML IV SOLN: 10.0000 mg | Freq: Once | INTRAVENOUS | Status: AC | PRN

## 2021-09-24 MED ORDER — SODIUM CHLORIDE 0.9 % IV SOLN: INTRAVENOUS | Status: DC

## 2021-09-24 MED ORDER — LACTATED RINGERS IV SOLN: INTRAVENOUS | Status: DC

## 2021-09-24 MED ORDER — PROMETHAZINE HCL 25 MG/ML IJ SOLN: 6.2500 mg | INTRAMUSCULAR | Status: DC | PRN

## 2021-09-24 MED ORDER — PHENOL 1.4 % MT LIQD: 1.0000 | OROMUCOSAL | Status: DC | PRN

## 2021-09-24 MED ORDER — PROPOFOL 500 MG/50ML IV EMUL
INTRAVENOUS | Status: DC | PRN
  Administered 2021-09-24: 30 mg via INTRAVENOUS

## 2021-09-24 MED ORDER — KETOROLAC TROMETHAMINE 30 MG/ML IJ SOLN
INTRAMUSCULAR | Status: DC | PRN
  Administered 2021-09-24: 30 mg via INTRA_ARTICULAR

## 2021-09-24 MED ORDER — GABAPENTIN 300 MG PO CAPS
300.0000 mg | ORAL_CAPSULE | Freq: Two times a day (BID) | ORAL | Status: DC
  Administered 2021-09-24 – 2021-09-27 (×6): 300 mg via ORAL
  Filled 2021-09-24 (×6): qty 1

## 2021-09-24 MED ORDER — ONDANSETRON HCL 4 MG/2ML IJ SOLN: 4.0000 mg | Freq: Four times a day (QID) | INTRAMUSCULAR | Status: DC | PRN

## 2021-09-24 MED ORDER — MIDAZOLAM HCL 2 MG/2ML IJ SOLN
1.0000 mg | INTRAMUSCULAR | Status: DC
  Administered 2021-09-24: 2 mg via INTRAVENOUS
  Filled 2021-09-24: qty 2

## 2021-09-24 MED ORDER — PHENYLEPHRINE HCL-NACL 20-0.9 MG/250ML-% IV SOLN
INTRAVENOUS | Status: DC | PRN
  Administered 2021-09-24: 40 ug/min via INTRAVENOUS

## 2021-09-24 MED ORDER — FENTANYL CITRATE (PF) 100 MCG/2ML IJ SOLN
INTRAMUSCULAR | Status: AC
  Filled 2021-09-24: qty 2

## 2021-09-24 MED ORDER — ACETAMINOPHEN 325 MG PO TABS: 325.0000 mg | ORAL_TABLET | Freq: Four times a day (QID) | ORAL | Status: DC | PRN

## 2021-09-24 MED ORDER — FENTANYL CITRATE PF 50 MCG/ML IJ SOSY
PREFILLED_SYRINGE | INTRAMUSCULAR | Status: AC
  Administered 2021-09-24: 50 ug via INTRAVENOUS
  Filled 2021-09-24: qty 3

## 2021-09-24 MED ORDER — METHOCARBAMOL 500 MG IVPB - SIMPLE MED
500.0000 mg | Freq: Four times a day (QID) | INTRAVENOUS | Status: DC | PRN
  Administered 2021-09-24: 500 mg via INTRAVENOUS
  Filled 2021-09-24: qty 50

## 2021-09-24 MED ORDER — OXYCODONE HCL 5 MG PO TABS
5.0000 mg | ORAL_TABLET | ORAL | Status: DC | PRN
  Administered 2021-09-25 – 2021-09-26 (×2): 5 mg via ORAL
  Administered 2021-09-26: 15:00:00 10 mg via ORAL
  Administered 2021-09-26: 11:00:00 5 mg via ORAL
  Administered 2021-09-26 – 2021-09-27 (×3): 10 mg via ORAL
  Filled 2021-09-24 (×2): qty 1
  Filled 2021-09-24: qty 2
  Filled 2021-09-24 (×2): qty 1

## 2021-09-24 MED ORDER — AMISULPRIDE (ANTIEMETIC) 5 MG/2ML IV SOLN
INTRAVENOUS | Status: AC
  Administered 2021-09-24: 10 mg via INTRAVENOUS
  Filled 2021-09-24: qty 4

## 2021-09-24 MED ORDER — KETOROLAC TROMETHAMINE 15 MG/ML IJ SOLN
7.5000 mg | Freq: Four times a day (QID) | INTRAMUSCULAR | Status: AC
  Administered 2021-09-24 – 2021-09-25 (×4): 7.5 mg via INTRAVENOUS
  Filled 2021-09-24 (×4): qty 1

## 2021-09-24 MED ORDER — PROPOFOL 1000 MG/100ML IV EMUL
INTRAVENOUS | Status: AC
  Filled 2021-09-24: qty 100

## 2021-09-24 MED ORDER — PHENYLEPHRINE HCL (PRESSORS) 10 MG/ML IV SOLN
INTRAVENOUS | Status: DC | PRN
  Administered 2021-09-24: 80 ug via INTRAVENOUS

## 2021-09-24 MED ORDER — METOCLOPRAMIDE HCL 5 MG PO TABS: 5.0000 mg | ORAL_TABLET | Freq: Three times a day (TID) | ORAL | Status: DC | PRN

## 2021-09-24 MED ORDER — 0.9 % SODIUM CHLORIDE (POUR BTL) OPTIME
TOPICAL | Status: DC | PRN
  Administered 2021-09-24: 1000 mL

## 2021-09-24 MED ORDER — FENTANYL CITRATE (PF) 100 MCG/2ML IJ SOLN
INTRAMUSCULAR | Status: DC | PRN
  Administered 2021-09-24 (×2): 50 ug via INTRAVENOUS

## 2021-09-24 MED ORDER — TRANEXAMIC ACID-NACL 1000-0.7 MG/100ML-% IV SOLN
1000.0000 mg | INTRAVENOUS | Status: AC
  Administered 2021-09-24: 1000 mg via INTRAVENOUS
  Filled 2021-09-24: qty 100

## 2021-09-24 MED ORDER — MENTHOL 3 MG MT LOZG: 1.0000 | LOZENGE | OROMUCOSAL | Status: DC | PRN

## 2021-09-24 MED ORDER — DEXAMETHASONE SODIUM PHOSPHATE 10 MG/ML IJ SOLN
INTRAMUSCULAR | Status: DC | PRN
  Administered 2021-09-24: 5 mg
  Administered 2021-09-24: 3 mg

## 2021-09-24 MED ORDER — PROMETHAZINE HCL 25 MG/ML IJ SOLN
INTRAMUSCULAR | Status: AC
  Filled 2021-09-24: qty 1

## 2021-09-24 MED ORDER — CELECOXIB 200 MG PO CAPS
200.0000 mg | ORAL_CAPSULE | Freq: Once | ORAL | Status: AC
  Administered 2021-09-24: 200 mg via ORAL
  Filled 2021-09-24: qty 1

## 2021-09-24 MED ORDER — MIDAZOLAM HCL 2 MG/2ML IJ SOLN
INTRAMUSCULAR | Status: AC
  Filled 2021-09-24: qty 2

## 2021-09-24 MED ORDER — POVIDONE-IODINE 10 % EX SWAB
2.0000 "application " | Freq: Once | CUTANEOUS | Status: AC
  Administered 2021-09-24: 2 via TOPICAL

## 2021-09-24 MED ORDER — BUPIVACAINE-EPINEPHRINE (PF) 0.25% -1:200000 IJ SOLN
INTRAMUSCULAR | Status: AC
  Filled 2021-09-24: qty 30

## 2021-09-24 MED ORDER — METHOCARBAMOL 500 MG PO TABS
500.0000 mg | ORAL_TABLET | Freq: Four times a day (QID) | ORAL | Status: DC | PRN
  Administered 2021-09-25 – 2021-09-27 (×4): 500 mg via ORAL
  Filled 2021-09-24 (×4): qty 1

## 2021-09-24 MED ORDER — ACETAMINOPHEN 500 MG PO TABS
1000.0000 mg | ORAL_TABLET | Freq: Once | ORAL | Status: AC
  Administered 2021-09-24: 1000 mg via ORAL
  Filled 2021-09-24: qty 2

## 2021-09-24 MED ORDER — OXYCODONE HCL 5 MG PO TABS
10.0000 mg | ORAL_TABLET | ORAL | Status: DC | PRN
  Administered 2021-09-24: 10 mg via ORAL
  Administered 2021-09-24: 15 mg via ORAL
  Administered 2021-09-25: 10 mg via ORAL
  Administered 2021-09-25: 15 mg via ORAL
  Administered 2021-09-27: 10 mg via ORAL
  Filled 2021-09-24: qty 2
  Filled 2021-09-24: qty 3
  Filled 2021-09-24: qty 2
  Filled 2021-09-24: qty 3
  Filled 2021-09-24: qty 2
  Filled 2021-09-24: qty 3
  Filled 2021-09-24: qty 2

## 2021-09-24 MED ORDER — ONDANSETRON HCL 4 MG PO TABS: 4.0000 mg | ORAL_TABLET | Freq: Four times a day (QID) | ORAL | Status: DC | PRN

## 2021-09-24 MED ORDER — VANCOMYCIN HCL 1000 MG IV SOLR
INTRAVENOUS | Status: AC
  Filled 2021-09-24: qty 20

## 2021-09-24 MED ORDER — ORAL CARE MOUTH RINSE: 15.0000 mL | Freq: Once | OROMUCOSAL | Status: AC

## 2021-09-24 MED ORDER — BUPIVACAINE-EPINEPHRINE 0.25% -1:200000 IJ SOLN
INTRAMUSCULAR | Status: DC | PRN
  Administered 2021-09-24: 30 mL

## 2021-09-24 MED ORDER — DIPHENHYDRAMINE HCL 12.5 MG/5ML PO ELIX: 12.5000 mg | ORAL_SOLUTION | ORAL | Status: DC | PRN

## 2021-09-24 MED ORDER — SODIUM CHLORIDE (PF) 0.9 % IJ SOLN
INTRAMUSCULAR | Status: DC | PRN
  Administered 2021-09-24: 30 mL

## 2021-09-24 MED ORDER — MIDAZOLAM HCL 5 MG/5ML IJ SOLN
INTRAMUSCULAR | Status: DC | PRN
  Administered 2021-09-24 (×2): .5 mg via INTRAVENOUS

## 2021-09-24 MED ORDER — OXYCODONE HCL 5 MG PO TABS
ORAL_TABLET | ORAL | Status: AC
  Filled 2021-09-24: qty 2

## 2021-09-24 MED ORDER — DEXAMETHASONE SODIUM PHOSPHATE 10 MG/ML IJ SOLN
INTRAMUSCULAR | Status: AC
  Filled 2021-09-24: qty 1

## 2021-09-24 MED ORDER — SODIUM CHLORIDE (PF) 0.9 % IJ SOLN
INTRAMUSCULAR | Status: AC
  Filled 2021-09-24: qty 30

## 2021-09-24 MED ORDER — FENTANYL CITRATE PF 50 MCG/ML IJ SOSY
50.0000 ug | PREFILLED_SYRINGE | INTRAMUSCULAR | Status: DC
  Administered 2021-09-24: 100 ug via INTRAVENOUS
  Filled 2021-09-24: qty 2

## 2021-09-24 MED ORDER — KETAMINE HCL 10 MG/ML IJ SOLN
INTRAMUSCULAR | Status: AC
  Filled 2021-09-24: qty 1

## 2021-09-24 MED ORDER — CELECOXIB 200 MG PO CAPS
200.0000 mg | ORAL_CAPSULE | Freq: Two times a day (BID) | ORAL | Status: DC
  Administered 2021-09-24 – 2021-09-27 (×6): 200 mg via ORAL
  Filled 2021-09-24 (×6): qty 1

## 2021-09-24 MED ORDER — EPHEDRINE SULFATE 50 MG/ML IJ SOLN
INTRAMUSCULAR | Status: DC | PRN
  Administered 2021-09-24: 10 mg via INTRAVENOUS

## 2021-09-24 MED ORDER — DEXAMETHASONE SODIUM PHOSPHATE 10 MG/ML IJ SOLN
10.0000 mg | Freq: Once | INTRAMUSCULAR | Status: AC
  Administered 2021-09-25: 10 mg via INTRAVENOUS
  Filled 2021-09-24: qty 1

## 2021-09-24 MED ORDER — CHLORHEXIDINE GLUCONATE 0.12 % MT SOLN
15.0000 mL | Freq: Once | OROMUCOSAL | Status: AC
  Administered 2021-09-24: 15 mL via OROMUCOSAL

## 2021-09-24 MED ORDER — FENTANYL CITRATE PF 50 MCG/ML IJ SOSY
25.0000 ug | PREFILLED_SYRINGE | INTRAMUSCULAR | Status: DC | PRN
  Administered 2021-09-24: 50 ug via INTRAVENOUS

## 2021-09-24 MED ORDER — ASPIRIN 81 MG PO CHEW
81.0000 mg | CHEWABLE_TABLET | Freq: Two times a day (BID) | ORAL | Status: DC
  Administered 2021-09-24 – 2021-09-27 (×6): 81 mg via ORAL
  Filled 2021-09-24 (×6): qty 1

## 2021-09-24 SURGICAL SUPPLY — 69 items
ADH SKN CLS APL DERMABOND .7 (GAUZE/BANDAGES/DRESSINGS) ×1
ATTUNE MED ANAT PAT 35 KNEE (Knees) ×1 IMPLANT
ATTUNE PSRP INSR SZ5 8 KNEE (Insert) ×1 IMPLANT
BAG COUNTER SPONGE SURGICOUNT (BAG) ×1 IMPLANT
BAG DECANTER FOR FLEXI CONT (MISCELLANEOUS) IMPLANT
BAG SPEC THK2 15X12 ZIP CLS (MISCELLANEOUS)
BAG SPNG CNTER NS LX DISP (BAG) ×1
BAG ZIPLOCK 12X15 (MISCELLANEOUS) IMPLANT
BLADE SAW SGTL 11.0X1.19X90.0M (BLADE) IMPLANT
BLADE SAW SGTL 13.0X1.19X90.0M (BLADE) ×2 IMPLANT
BLADE SAW SGTL 81X20 HD (BLADE) ×2 IMPLANT
BLADE SURG SZ10 CARB STEEL (BLADE) ×4 IMPLANT
BNDG CMPR MED 10X6 ELC LF (GAUZE/BANDAGES/DRESSINGS) ×1
BNDG ELASTIC 6X10 VLCR STRL LF (GAUZE/BANDAGES/DRESSINGS) ×1 IMPLANT
BNDG ELASTIC 6X5.8 VLCR STR LF (GAUZE/BANDAGES/DRESSINGS) ×2 IMPLANT
BRUSH FEMORAL CANAL (MISCELLANEOUS) IMPLANT
BSPLAT TIB 4 CMNT REV ROT PLAT (Knees) ×1 IMPLANT
CEMENT HV SMART SET (Cement) ×3 IMPLANT
COMP FEM ATTUNE CRS CEM RT SZ5 (Femur) ×2 IMPLANT
COMPONENT FEM ATN CR CEM RTSZ5 (Femur) IMPLANT
COVER SURGICAL LIGHT HANDLE (MISCELLANEOUS) ×2 IMPLANT
CUFF TOURN SGL QUICK 34 (TOURNIQUET CUFF) ×2
CUFF TRNQT CYL 34X4.125X (TOURNIQUET CUFF) ×1 IMPLANT
DECANTER SPIKE VIAL GLASS SM (MISCELLANEOUS) ×4 IMPLANT
DERMABOND ADVANCED (GAUZE/BANDAGES/DRESSINGS) ×1
DERMABOND ADVANCED .7 DNX12 (GAUZE/BANDAGES/DRESSINGS) ×1 IMPLANT
DRAPE INCISE IOBAN 66X45 STRL (DRAPES) ×2 IMPLANT
DRAPE U-SHAPE 47X51 STRL (DRAPES) ×2 IMPLANT
DRESSING AQUACEL AG SP 3.5X10 (GAUZE/BANDAGES/DRESSINGS) IMPLANT
DRSG AQUACEL AG ADV 3.5X10 (GAUZE/BANDAGES/DRESSINGS) ×2 IMPLANT
DRSG AQUACEL AG ADV 3.5X14 (GAUZE/BANDAGES/DRESSINGS) ×1 IMPLANT
DRSG AQUACEL AG SP 3.5X10 (GAUZE/BANDAGES/DRESSINGS)
DURAPREP 26ML APPLICATOR (WOUND CARE) ×4 IMPLANT
ELECT REM PT RETURN 15FT ADLT (MISCELLANEOUS) ×2 IMPLANT
GAUZE SPONGE 2X2 8PLY STRL LF (GAUZE/BANDAGES/DRESSINGS) IMPLANT
GLOVE SURG UNDER POLY LF SZ7.5 (GLOVE) ×4 IMPLANT
GOWN STRL REUS W/TWL LRG LVL3 (GOWN DISPOSABLE) ×2 IMPLANT
HANDPIECE INTERPULSE COAX TIP (DISPOSABLE) ×2
HOLDER FOLEY CATH W/STRAP (MISCELLANEOUS) ×1 IMPLANT
INSERT TIB CMT ATTUNE RP SZ4 (Knees) ×1 IMPLANT
IRRIGATION SURGIPHOR STRL (IV SOLUTION) IMPLANT
KIT TURNOVER KIT A (KITS) ×1 IMPLANT
MANIFOLD NEPTUNE II (INSTRUMENTS) ×2 IMPLANT
NDL SAFETY ECLIPSE 18X1.5 (NEEDLE) IMPLANT
NEEDLE HYPO 18GX1.5 SHARP (NEEDLE) ×2
NS IRRIG 1000ML POUR BTL (IV SOLUTION) ×1 IMPLANT
PACK TOTAL KNEE CUSTOM (KITS) ×2 IMPLANT
PROTECTOR NERVE ULNAR (MISCELLANEOUS) ×2 IMPLANT
RESTRICTOR CEMENT SZ 5 C-STEM (Cement) ×1 IMPLANT
SET HNDPC FAN SPRY TIP SCT (DISPOSABLE) ×1 IMPLANT
SET PAD KNEE POSITIONER (MISCELLANEOUS) ×2 IMPLANT
SLEEVE TIB ATTUNE FP 37 (Knees) ×1 IMPLANT
SPONGE GAUZE 2X2 STER 10/PKG (GAUZE/BANDAGES/DRESSINGS)
SPONGE T-LAP 18X18 ~~LOC~~+RFID (SPONGE) ×3 IMPLANT
STAPLER VISISTAT 35W (STAPLE) IMPLANT
STEM REV CEMENTED 14X50MM (Stem) ×1 IMPLANT
STEM STR ATTUNE PF 16X60 (Knees) ×1 IMPLANT
SUT MNCRL AB 3-0 PS2 18 (SUTURE) ×2 IMPLANT
SUT STRATAFIX PDS+ 0 24IN (SUTURE) ×2 IMPLANT
SUT VIC AB 1 CT1 36 (SUTURE) ×2 IMPLANT
SUT VIC AB 2-0 CT1 27 (SUTURE) ×6
SUT VIC AB 2-0 CT1 TAPERPNT 27 (SUTURE) ×3 IMPLANT
SYR 50ML LL SCALE MARK (SYRINGE) IMPLANT
TOWER CARTRIDGE SMART MIX (DISPOSABLE) ×2 IMPLANT
TRAY FOLEY MTR SLVR 14FR STAT (SET/KITS/TRAYS/PACK) ×1 IMPLANT
TUBE KAMVAC SUCTION (TUBING) IMPLANT
TUBE SUCTION HIGH CAP CLEAR NV (SUCTIONS) ×2 IMPLANT
WATER STERILE IRR 1000ML POUR (IV SOLUTION) ×1 IMPLANT
WRAP KNEE MAXI GEL POST OP (GAUZE/BANDAGES/DRESSINGS) ×2 IMPLANT

## 2021-09-24 NOTE — Transfer of Care (Signed)
Immediate Anesthesia Transfer of Care Note  Patient: Stephanie [name]  Procedure(s) Performed: Procedure(s) with comments: TOTAL KNEE REVISION, SAPHENOUS NEURECTOMY (Right) - 2 HRS  Patient Location: PACU  Anesthesia Type:Spinal  Level of Consciousness: awake, alert  and oriented  Airway & Oxygen Therapy: Patient Spontanous Breathing  Post-op Assessment: Report given to RN and Post -op Vital signs reviewed and stable  Post vital signs: Reviewed and stable  Last Vitals:  Vitals:   09/24/21 1345 09/24/21 1350  BP: 119/72 119/67  Pulse: 62 (!) 55  Resp: (!) 21 17  Temp:    SpO2: 585% 929%    Complications: No apparent anesthesia complications

## 2021-09-24 NOTE — Anesthesia Postprocedure Evaluation (Signed)
Anesthesia Post Note  Patient: Stephanie [name]  Procedure(s) Performed: TOTAL KNEE REVISION, SAPHENOUS NEURECTOMY (Right: Knee)     Patient location during evaluation: PACU Anesthesia Type: Spinal Level of consciousness: awake Pain management: pain level controlled Vital Signs Assessment: post-procedure vital signs reviewed and stable Respiratory status: spontaneous breathing Cardiovascular status: stable Postop Assessment: no apparent nausea or vomiting Anesthetic complications: no   No notable events documented.  Last Vitals:  Vitals:   09/24/21 1650 09/24/21 1730  BP: 110/71 124/63  Pulse: 88 61  Resp: 14 11  Temp: 36.7 C   SpO2: 99% 97%    Last Pain:  Vitals:   09/24/21 1730  TempSrc:   PainSc: 0-No pain                 Haden Suder

## 2021-09-24 NOTE — Op Note (Signed)
NAMEHAWLEY, PAVIA MEDICAL RECORD NO: [id number] ACCOUNT NO: [id number] DATE OF BIRTH: 08-Jun-1956 FACILITY: [facility]ATION: [id number] PHYSICIAN: Pietro Cassis. Alvan Dame, MD  Operative Report   DATE OF PROCEDURE: 09/24/2021  PREOPERATIVE DIAGNOSES: 1.  Painful stiff right total knee arthroplasty. 2.  Hypersensitivity in the distribution of the saphenous nerve consistent with saphenous neuritis postoperatively.  POSTOPERATIVE DIAGNOSES: 1.  Painful stiff right total knee arthroplasty. 2.  Hypersensitivity in the distribution of the saphenous nerve consistent with saphenous neuritis postoperatively.  PROCEDURES:   1.  Excision of right saphenous nerve branches medially. 2.  Revision right total knee arthroplasty.  COMPONENTS USED:  DePuy Attune revision knee system with a size 5 revision right femoral component with a 14 x 80 mm cemented stem, a size 4 tibial revision Attune tray with a 16 x 60 press-fit stem with a size 8 mm posterior stabilized insert and a new  35 patellar button.  SURGEON:  Pietro Cassis. Alvan Dame, MD  ASSISTANT:  Costella Hatcher, PA-C. Note that Ms. Lu Duffel was present for the entirety of the case for preoperative positioning, perioperative management of the operative extremity, general facilitation of the case and primary wound closure.  ANESTHESIA:  Regional plus spinal.  BLOOD LOSS:  Less than 100 mL  DRAINS:  None.  COMPLICATIONS:  None.  TOURNIQUET: Up for 77 minutes at 250 mmHg.  INDICATION FOR PROCEDURE:  The patient is a 65 year old female referred for evaluation and management of a painful right knee.  Obvious subjective findings in the office were significant hypersensitivity to palpation of her skin on the medial side of her  incision.  Additionally, she had a limited arc of motion, getting near full active extension, but only flexing to about 80 degrees with tightness and pain.  Clinical workup was negative for infection.  We had a lengthy discussion regarding  management of  her knee.  We did discuss the possibility of trying to excise the saphenous nerve branches medially and see if we can decrease this hypersensitivity.  In addition, we would plan on revision surgery.  We would need to do a full bone revision in order to  adequately debride as a polyethylene exchange was not all that was indicated as she did have full knee extension.  The risks of persistent stiffness postoperatively, the risk of persistent pain, the risk of DVT, component failure and need for future  surgeries were all discussed and reviewed.  Consent was obtained for benefit of pain relief.  PROCEDURE IN DETAIL:  The patient was brought to the operative theater.  Once adequate anesthesia, preoperative antibiotics, Ancef administered as well as tranexamic acid and Decadron, she was positioned supine with a right thigh tourniquet placed.  The  right lower extremity was prepped and draped in sterile fashion.  A timeout was performed identifying the patient, planned procedure, and extremity.  Her old incision was partially excised.  Soft tissue planes were created.  Once I did this, we focused  on the saphenous nerve portion of the case.  I had created subcutaneous flaps at this point dissected through the medial subcutaneous fat layer.  I did not take this to the subdermal layer, but rather I was able to get a substantial amount from the  proximal pole of the patella to the inferior aspect of the incision.  I do feel that I saw a branch of the saphenous nerve heading off into the medial proximal aspect of her knee.  This area of tissue was excised.  Once  this was completed, we turned  focus to the knee.  Medial arthrotomy was then made, encountering clear synovial fluid.  Significant portion of time was at this point spent at the excisional debridement of the knee including the scar tissue within the medial suprapatellar lateral  aspects of the knee.  Once I had the knee exposed enough, I was  able to evert the patella and debride scar tissue around the patella.  I did measure the height of the patella and found the patellar height was 26 mm.  Based on this, I elected to perform a  revision of the patella as will be noted below.  Once the knee was exposed and I was able to get the knee flexed to over 100 degrees, we placed retractors and used a thin ACL saw to undermine the bone cement interface on the tibia and on the femoral  component.  Both components were removed with minimal bone loss.  I removed all the remaining cement from the tibia side.  Once this was done, we opened up the canals.  I reamed up to 16 mm on the femoral side and 16 mm on the tibia side.  On the tibia  side, we did go up a bit more to 17 for the press-fit component.  We then irrigated the canals with a canal brush irrigator.  I used at this point an extramedullary guide and made a small freshened cut on the proximal tibia to freshen up this area,  though I knew that I was going to most likely have the prosthesis off the bone.  This was done mainly to clean remaining cement and bone debris.  Once this was done, I broached on the tibia side from a 29 to a 37 sleeve with good fixation.  I left the  sleeve in place and placed a size 4 tibial baseplate on top of the sleeve.  Attention was now directed to the femur.  We sized the femur to be a size 5.  The size 5 anterior, posterior block was then pinned into position based off the rotation of the  tibial baseplate.  There was no cut off the anterior aspect of femur needed. Posteriorly, there was minimal cut.  Chamfer cuts were revisited, but no significant cuts necessary.  The box cut was then recreated for this revision component.  At this point,  we did a trial reduction with a size 5 right Attune revision tray with a 16 x 80 stem and on the tibia side already in place.  We trialled and up to a size 8 insert seemed to balance extension into flexion.  With the trial components  removed, I then  everted the patella again.  We removed the old patellar button and old plastic lugs.  I then measured the patella height to be 13 mm.  I then selected a 35 patellar button to restore height and cover the cut surface as much as possible.  Lug holes were  drilled.  The patella was noted to track through the trochlea without application of pressure.  At this point, all trial components were removed.  We injected the synovial capsular junction of the knee with 0.25% Marcaine with epinephrine, 1 mL of Toradol and saline.  We measured and placed a size 5 cement restrictor into the distal femur.  The  knee was copiously irrigated with normal saline solution as the final components were opened and configured on the back table under direct supervision.  Once the components  were made, the cement was mixed.  We press fit the tibia side in place.  The  final components sat at the level where the trial component had sat.  The final femoral component was then cemented into place.  We then placed a size 8 mm insert and brought the knee into extension to allow the cement to fully cure.  Once the cement  fully cured, excessive cement was removed throughout the knee.  We selected the size 8 mm insert to match the 5 femur as a posterior stabilized insert.  It was placed into the knee.  We did note that the knee came to full extension and that  flex in the  office with the capsule open to at least 110 degrees with slight pressure. Given these findings, following irrigation, we reapproximated the extensor mechanism using #1 Vicryl and #1 Stratafix suture.  The remainder of the wound was closed with 2-0  Vicryl and a running Monocryl stitch.  The knee was clean, dry and dressed sterilely using surgical glue and Aquacel dressing.  The knee was wrapped in an Ace wrap.  She was then brought to the recovery room in stable condition, tolerating the procedure  well.  Postoperatively, she will be partial  weightbearing for probably the first 6 to 8 weeks; however, we will not restrict efforts with motion.  We will order a CPM while she is in the hospital to begin motion from 0 to 80 or 90 degrees and progress 5 degrees  daily.  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   Findings were reviewed with her husband.   VAI D: 09/24/2021 4:54:06 pm T: 09/24/2021 9:49:00 pm  JOB: [id number]/ [id number]

## 2021-09-24 NOTE — Interval H&P Note (Signed)
History and Physical Interval Note:  09/24/2021 4:35 PM  Stephanie [name]  has presented today for surgery, with the diagnosis of Painful and stiff right total knee arthroplasty.  The various methods of treatment have been discussed with the patient and family. After consideration of risks, benefits and other options for treatment, the patient has consented to  Procedure(s) with comments: TOTAL KNEE REVISION, SAPHENOUS NEURECTOMY (Right) - 2 HRS as a surgical intervention.  The patient's history has been reviewed, patient examined, no change in status, stable for surgery.  I have reviewed the patient's chart and labs.  Questions were answered to the patient's satisfaction.     Mauri Pole

## 2021-09-24 NOTE — Anesthesia Preprocedure Evaluation (Addendum)
Anesthesia Evaluation  Patient identified by MRN, date of birth, ID band Patient awake    Reviewed: Allergy & Precautions, NPO status , Patient's Chart, lab work & pertinent test results  History of Anesthesia Complications Negative for: history of anesthetic complications  Airway Mallampati: II  TM Distance: >3 FB Neck ROM: Full    Dental no notable dental hx. (+) Dental Advisory Given   Pulmonary neg pulmonary ROS, former smoker,    Pulmonary exam normal        Cardiovascular negative cardio ROS Normal cardiovascular exam     Neuro/Psych negative neurological ROS     GI/Hepatic negative GI ROS, Neg liver ROS,   Endo/Other  negative endocrine ROS  Renal/GU negative Renal ROS     Musculoskeletal negative musculoskeletal ROS (+)   Abdominal   Peds  Hematology negative hematology ROS (+)   Anesthesia Other Findings   Reproductive/Obstetrics                            Anesthesia Physical Anesthesia Plan  ASA: 2  Anesthesia Plan: Spinal   Post-op Pain Management: Regional block, Celebrex PO (pre-op) and Tylenol PO (pre-op)   Induction:   PONV Risk Score and Plan: 3 and Ondansetron, Dexamethasone and Propofol infusion  Airway Management Planned: Natural Airway  Additional Equipment:   Intra-op Plan:   Post-operative Plan:   Informed Consent: I have reviewed the patients History and Physical, chart, labs and discussed the procedure including the risks, benefits and alternatives for the proposed anesthesia with the patient or authorized representative who has indicated his/her understanding and acceptance.     Dental advisory given  Plan Discussed with: Anesthesiologist and CRNA  Anesthesia Plan Comments:        Anesthesia Quick Evaluation

## 2021-09-24 NOTE — Anesthesia Procedure Notes (Signed)
Spinal  Start time: 09/24/2021 2:05 PM End time: 09/24/2021 2:10 PM Staffing Performed: resident/CRNA  Resident/CRNA: Garrel Ridgel, CRNA Preanesthetic Checklist Completed: patient identified, IV checked, site marked, risks and benefits discussed, surgical consent, monitors and equipment checked, pre-op evaluation and timeout performed Spinal Block Patient position: sitting Prep: DuraPrep Patient monitoring: heart rate, cardiac monitor, continuous pulse ox and blood pressure Approach: midline Location: L3-4 Injection technique: single-shot Needle Needle type: Pencan  Needle gauge: 24 G Needle length: 9 cm Needle insertion depth: 5 cm Assessment Events: CSF return

## 2021-09-24 NOTE — Brief Op Note (Signed)
09/24/2021  4:42 PM  PATIENT:  Stephanie [name]  65 y.o. female  PRE-OPERATIVE DIAGNOSIS:  Painful and stiff right total knee arthroplasty  POST-OPERATIVE DIAGNOSIS:  Painful and stiff right total knee arthroplasty  PROCEDURE:  Procedure(s) with comments: TOTAL KNEE REVISION, SAPHENOUS NEURECTOMY (Right) - 2 HRS  SURGEON:  Surgeon(s) and Role:    Paralee Cancel, MD - Primary  PHYSICIAN ASSISTANT: Costella Hatcher, PA-C  ANESTHESIA:   regional and spinal  EBL:  70 mL   BLOOD ADMINISTERED:none  DRAINS: none   LOCAL MEDICATIONS USED:  MARCAINE     SPECIMEN:  No Specimen  DISPOSITION OF SPECIMEN:  N/A  COUNTS:  YES  TOURNIQUET:   Total Tourniquet Time Documented: Thigh (Right) - 77 minutes Total: Thigh (Right) - 77 minutes   DICTATION: .Other Dictation: Dictation Number [id number]  PLAN OF CARE: Admit to inpatient   PATIENT DISPOSITION:  PACU - hemodynamically stable.   Delay start of Pharmacological VTE agent (>24hrs) due to surgical blood loss or risk of bleeding: no

## 2021-09-24 NOTE — Progress Notes (Signed)
Orthopedic Tech Progress Note Patient Details:  Stephanie [name] 02-Oct-1956 [id number]  CPM Right Knee CPM Right Knee: Off Right Knee Flexion (Degrees): 50 Right Knee Extension (Degrees): 0  Post Interventions Patient Tolerated: Well CPM off at 7:30. Vernona Rieger 09/24/2021, 7:37 PM

## 2021-09-24 NOTE — Discharge Instructions (Signed)

## 2021-09-24 NOTE — Anesthesia Procedure Notes (Signed)
Spinal

## 2021-09-24 NOTE — Anesthesia Procedure Notes (Addendum)
Anesthesia Regional Block: Adductor canal block   Pre-Anesthetic Checklist: , timeout performed,  Correct Patient, Correct Site, Correct Laterality,  Correct Procedure, Correct Position, site marked,  Risks and benefits discussed,  Surgical consent,  Pre-op evaluation,  At surgeon's request and post-op pain management  Laterality: Right  Prep: chloraprep       Needles:  Injection technique: Single-shot  Needle Type: Stimulator Needle - 80     Needle Length: 10cm  Needle Gauge: 21     Additional Needles:   Procedures:,,,, ultrasound used (permanent image in chart),,    Narrative:  Start time: 09/24/2021 12:51 PM End time: 09/24/2021 1:01 PM Injection made incrementally with aspirations every 5 mL.  Performed by: Personally  Anesthesiologist: Duane Boston, MD

## 2021-09-24 NOTE — Progress Notes (Signed)
Assisted Dr. Duane Boston  with Right Knee Adductor Canal  block. Side rails up, monitors on throughout procedure. See vital signs in flow sheet. Tolerated Procedure well.

## 2021-09-24 NOTE — H&P (Signed)
TOTAL KNEE REVISION ADMISSION H&P  Patient is being admitted for right revision total knee arthroplasty.  Subjective:  Chief Complaint:right knee arthrofibrosis  HPI: Stephanie [name], 65 y.o. female, has a history of pain and functional disability in the right knee(s) due to failed previous arthroplasty and patient has failed non-surgical conservative treatments for greater than 12 weeks to include supervised PT with diminished ADL's post treatment and activity modification. The indications for the revision of the total knee arthroplasty are  arthrofibrosis . She initially had a right total knee arthroplasty in December 2021 by Dr. Theda Sers. She then underwent closed manipulation in March.There is no current active infection.  Patient Active Problem List   Diagnosis Date Noted   Trochanteric bursitis of left hip 09/22/2020   Lumbar spinal stenosis 10/04/2019   Primary osteoarthritis of right knee 03/03/2018   Cervical pain (neck) 10/02/2015   Achilles tendinitis of left lower extremity 09/06/2015   Paresthesia and pain of right extremity 09/06/2015   Splenic artery aneurysm ([place]) 01/21/2013   Diverticulitis of colon without hemorrhage 01/21/2013   COSTOCHONDRITIS 01/01/2011   DYSPNEA ON EXERTION 01/01/2011   BENIGN NEOPLASM OF OTHER SPECIFIED SITES 12/18/2009   Past Medical History:  Diagnosis Date   Diverticulitis 01/21/2013   History of kidney stones    Nephrolithiasis     Past Surgical History:  Procedure Laterality Date   CHOLECYSTECTOMY  10/14/1980   JOINT REPLACEMENT Right 09/28/2020   knee   KNEE ARTHROSCOPY Right 02/2020   MANIPULATION KNEE JOINT Right 12/2020   TONSILECTOMY, ADENOIDECTOMY, BILATERAL MYRINGOTOMY AND TUBES     at age 34 approx.    TUBAL LIGATION  10/15/1991    Current Facility-Administered Medications  Medication Dose Route Frequency Provider Last Rate Last Admin   ceFAZolin (ANCEF) IVPB 2g/100 mL premix  2 g Intravenous On Call to OR Irving Copas,  PA-C       dexamethasone (DECADRON) injection 8 mg  8 mg Intravenous Once Irving Copas, PA-C       fentaNYL (SUBLIMAZE) injection 50-100 mcg  50-100 mcg Intravenous Sherri Rad, MD   100 mcg at 09/24/21 1259   lactated ringers infusion   Intravenous Continuous Irving Copas, PA-C 75 mL/hr at 09/24/21 1114 Continued from Pre-op at 09/24/21 1114   lactated ringers infusion   Intravenous Continuous Stoltzfus, March Rummage, DO       midazolam (VERSED) injection 1-2 mg  1-2 mg Intravenous Sherri Rad, MD   2 mg at 09/24/21 1259   tranexamic acid (CYKLOKAPRON) IVPB 1,000 mg  1,000 mg Intravenous To OR Irving Copas, PA-C       Facility-Administered Medications Ordered in Other Encounters  Medication Dose Route Frequency Provider Last Rate Last Admin   dexamethasone (DECADRON) injection   Infiltration Anesthesia Intra-op Duane Boston, MD   5 mg at 09/24/21 1300   ropivacaine (PF) 7.5 mg/mL (0.75%) (NAROPIN) injection   Peri-NEURAL Anesthesia Intra-op Duane Boston, MD   20 mL at 09/24/21 1300   Allergies  Allergen Reactions   Duloxetine Nausea Only    Social History   Tobacco Use   Smoking status: Former    Packs/day: 0.20    Years: 2.00    Pack years: 0.40    Types: Cigarettes    Quit date: 10/14/1976    Years since quitting: 44.9   Smokeless tobacco: Never  Substance Use Topics   Alcohol use: No    Family History  Problem Relation Age of Onset  Coronary artery disease Other        family hx of/ heart valve replacement   Alcohol abuse Father    Cancer Father    Prostate cancer Father    COPD Father    Hyperlipidemia Father    Cancer Mother    Hyperlipidemia Mother    Colon cancer Neg Hx       Review of Systems  Constitutional:  Negative for chills and fever.  Respiratory:  Negative for cough and shortness of breath.   Cardiovascular:  Negative for chest pain.  Gastrointestinal:  Negative for nausea and vomiting.  Musculoskeletal:  Positive for  arthralgias.     Objective:  Physical Exam Well nourished and well developed. General: Alert and oriented x3, cooperative and pleasant, no acute distress. Head: normocephalic, atraumatic, neck supple. Eyes: EOMI.  Musculoskeletal: Right knee exam: Her surgical incision is healed without signs of infection, no erythema or warmth Tenderness medially somewhat related to some hypersensitivity to palpation She does demonstrate about a 5 degree flexion contracture and flexes only to about 90 degrees No findings for flexion instability No significant crepitation Tenderness over the proximal aspect of the patella as well. No lower extremity edema, erythema or calf tenderness.  Calves soft and nontender. Motor function intact in LE. Strength 5/5 LE bilaterally. Neuro: Distal pulses 2+. Sensation to light touch intact in LE.  Vital signs in last 24 hours: Temp:  [97.9 F (36.6 C)] 97.9 F (36.6 C) (12/12 1030) Pulse Rate:  [53-81] 55 (12/12 1350) Resp:  [15-27] 17 (12/12 1350) BP: (104-134)/(54-105) 119/67 (12/12 1350) SpO2:  [99 %-100 %] 100 % (12/12 1350) Weight:  [91.6 kg] 91.6 kg (12/12 1039)  Labs:  Estimated body mass index is 36.95 kg/m as calculated from the following:   Height as of this encounter: 5\' 2"  (1.575 m).   Weight as of this encounter: 91.6 kg.  Imaging Review Plain radiographs demonstrate  what appear to be stable femoral and tibial components that appear to be well fixed. 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 Based on her lateral radiograph I will want to evaluate the height of her patella.    Assessment/Plan:  End stage arthritis, right knee(s) with failed previous arthroplasty.   The patient history, physical examination, clinical judgment of the provider and imaging studies are consistent with arthrofibrosis of the right knee(s), previous total  knee arthroplasty. Revision total knee arthroplasty is deemed medically necessary. The treatment options including medical management, injection therapy, arthroscopy and revision arthroplasty were discussed at length. The risks and benefits of revision total knee arthroplasty were presented and reviewed. The risks due to aseptic loosening, infection, stiffness, patella tracking problems, thromboembolic complications and other imponderables were discussed. The patient acknowledged the explanation, agreed to proceed with the plan and consent was signed. Patient is being admitted for inpatient treatment for surgery, pain control, PT, OT, prophylactic antibiotics, VTE prophylaxis, progressive ambulation and ADL's and discharge planning.The patient is planning to be discharged  home.  Therapy Plans: outpatient therapy at [facility] Disposition: Home with husband Planned DVT Prophylaxis: aspirin 81mg  BID DME needed: none PCP: Dr. Madilyn Fireman, clearance received TXA: IV Allergies: duloxetine - nausea Anesthesia Concerns: none BMI: 36.5 Last HgbA1c: not diabetic.  Other: - Oxycodone, robaxin, celebrex   Costella Hatcher, PA-C Orthopedic Surgery [facility] [place] [phone]

## 2021-09-24 NOTE — Progress Notes (Signed)
Orthopedic Tech Progress Note Patient Details:  Stephanie [name] 05/23/1956 [id number]  CPM Right Knee CPM Right Knee: On Right Knee Flexion (Degrees): 60 Right Knee Extension (Degrees): 0  Post Interventions Patient Tolerated: Well CPM applied in PACU. Vernona Rieger 09/24/2021, 6:09 PM

## 2021-09-25 ENCOUNTER — Ambulatory Visit
Admission: RE | Admit: 2021-09-25 | Discharge: 2021-09-25 | Disposition: A | Discharge status: Home / Self Care | Payer: BC Managed Care – PPO | Source: Ambulatory Visit | Attending: Radiation Oncology | Admitting: Radiation Oncology

## 2021-09-25 ENCOUNTER — Encounter: Payer: Self-pay | Admitting: Radiation Oncology

## 2021-09-25 ENCOUNTER — Other Ambulatory Visit: Payer: Self-pay

## 2021-09-25 DIAGNOSIS — M6158 Other ossification of muscle, other site: Secondary | ICD-10-CM | POA: Diagnosis present

## 2021-09-25 DIAGNOSIS — T84018A Broken internal joint prosthesis, other site, initial encounter: Secondary | ICD-10-CM

## 2021-09-25 DIAGNOSIS — Z510 Encounter for antineoplastic radiation therapy: Secondary | ICD-10-CM | POA: Diagnosis present

## 2021-09-25 DIAGNOSIS — T8484XA Pain due to internal orthopedic prosthetic devices, implants and grafts, initial encounter: Secondary | ICD-10-CM | POA: Diagnosis not present

## 2021-09-25 LAB — CBC
HCT: 31.7 % — ABNORMAL LOW (ref 36.0–46.0)
Hemoglobin: 9.7 g/dL — ABNORMAL LOW (ref 12.0–15.0)
MCH: 27.2 pg (ref 26.0–34.0)
MCHC: 30.6 g/dL (ref 30.0–36.0)
MCV: 89 fL (ref 80.0–100.0)
Platelets: 137 10*3/uL — ABNORMAL LOW (ref 150–400)
RBC: 3.56 MIL/uL — ABNORMAL LOW (ref 3.87–5.11)
RDW: 12.8 % (ref 11.5–15.5)
WBC: 12.1 10*3/uL — ABNORMAL HIGH (ref 4.0–10.5)
nRBC: 0 % (ref 0.0–0.2)

## 2021-09-25 LAB — BASIC METABOLIC PANEL WITH GFR
Anion gap: 7 (ref 5–15)
BUN: 16 mg/dL (ref 8–23)
CO2: 24 mmol/L (ref 22–32)
Calcium: 8.5 mg/dL — ABNORMAL LOW (ref 8.9–10.3)
Chloride: 109 mmol/L (ref 98–111)
Creatinine, Ser: 0.7 mg/dL (ref 0.44–1.00)
GFR, Estimated: >60 mL/min
Glucose, Bld: 163 mg/dL — ABNORMAL HIGH (ref 70–99)
Potassium: 4.1 mmol/L (ref 3.5–5.1)
Sodium: 140 mmol/L (ref 135–145)

## 2021-09-25 MED ORDER — POLYETHYLENE GLYCOL 3350 17 G PO PACK: 17.0000 g | PACK | Freq: Every day | ORAL | 0 refills | Status: DC | PRN

## 2021-09-25 MED ORDER — DOCUSATE SODIUM 100 MG PO CAPS: 100.0000 mg | ORAL_CAPSULE | Freq: Two times a day (BID) | ORAL | 0 refills | Status: DC

## 2021-09-25 MED ORDER — METHOCARBAMOL 500 MG PO TABS
500.0000 mg | ORAL_TABLET | Freq: Four times a day (QID) | ORAL | 0 refills | Status: DC | PRN
Start: 2021-09-25 — End: 2022-06-14

## 2021-09-25 MED ORDER — ASPIRIN 81 MG PO CHEW: 81.0000 mg | CHEWABLE_TABLET | Freq: Two times a day (BID) | ORAL | 0 refills | Status: AC

## 2021-09-25 MED ORDER — FERROUS SULFATE 325 (65 FE) MG PO TABS: 325.0000 mg | ORAL_TABLET | Freq: Two times a day (BID) | ORAL | 0 refills | Status: DC

## 2021-09-25 MED ORDER — OXYCODONE HCL 5 MG PO TABS: 5.0000 mg | ORAL_TABLET | ORAL | 0 refills | Status: DC | PRN

## 2021-09-25 MED ORDER — CELECOXIB 200 MG PO CAPS: 200.0000 mg | ORAL_CAPSULE | Freq: Two times a day (BID) | ORAL | 0 refills | Status: DC

## 2021-09-25 NOTE — Evaluation (Addendum)
Physical Therapy Evaluation Patient Details Name: Stephanie [name] MRN: [id number] DOB: [birth year] Today's Date: 09/25/2021  History of Present Illness  Pt is a 65 year old female s/p RIGHT TOTAL KNEE REVISION, SAPHENOUS NEURECTOMY on 09/24/21  Clinical Impression  Pt is s/p TKA revision resulting in the deficits listed below (see PT Problem List). Pt will benefit from skilled PT to increase their independence and safety with mobility to allow discharge to the venue listed below.  Pt ambulated in hallway and educated on PWB status with mobilizing.  Pt also performed LE exercises.        Recommendations for follow up therapy are one component of a multi-disciplinary discharge planning process, led by the attending physician.  Recommendations may be updated based on patient status, additional functional criteria and insurance authorization.  Follow Up Recommendations Follow physician's recommendations for discharge plan and follow up therapies    Assistance Recommended at Discharge PRN  Functional Status Assessment Patient has had a recent decline in their functional status and demonstrates the ability to make significant improvements in function in a reasonable and predictable amount of time.  Equipment Recommendations  None recommended by PT    Recommendations for Other Services       Precautions / Restrictions Precautions Precautions: Fall;Knee Restrictions Weight Bearing Restrictions: Yes RLE Weight Bearing: Partial weight bearing RLE Partial Weight Bearing Percentage or Pounds: 50%      Mobility  Bed Mobility Overal bed mobility: Needs Assistance Bed Mobility: Supine to Sit     Supine to sit: Supervision;HOB elevated          Transfers Overall transfer level: Needs assistance Equipment used: Rolling walker (2 wheels) Transfers: Sit to/from Stand Sit to Stand: Min guard           General transfer comment: verbal cues for UE and LE positioning, maintaining PWB  status    Ambulation/Gait Ambulation/Gait assistance: Min guard Gait Distance (Feet): 160 Feet Assistive device: Rolling walker (2 wheels) Gait Pattern/deviations: Step-to pattern;Decreased stance time - right;Antalgic       General Gait Details: verbal cues for sequence, RW positioning, step length, pt maintains PWB well  Stairs            Wheelchair Mobility    Modified Rankin (Stroke Patients Only)       Balance                                             Pertinent Vitals/Pain Pain Assessment: 0-10 Pain Score: 4  Pain Location: right knee Pain Descriptors / Indicators: Aching;Sore Pain Intervention(s): Repositioned;Premedicated before session;Monitored during session    [place] expects to be discharged to:: Private residence Living Arrangements: Spouse/significant other Available Help at Discharge: Family Type of Home: House Home Access: [place]: One [place]: Conservation officer, nature (2 wheels)      Prior Function Prior Level of Function : Independent/Modified Independent                     Hand Dominance        Extremity/Trunk Assessment        Lower Extremity Assessment Lower Extremity Assessment: RLE deficits/detail RLE Deficits / Details: unable to perform SLR, right knee AAROM approx 5-55*       Communication   Communication: No difficulties  Cognition  Arousal/Alertness: Awake/alert Behavior During Therapy: WFL for tasks assessed/performed Overall Cognitive Status: Within Functional Limits for tasks assessed                                          General Comments      Exercises Total Joint Exercises Ankle Circles/Pumps: AROM;Both;10 reps Quad Sets: AROM;Both;10 reps Heel Slides: 10 reps;AAROM;Right Hip ABduction/ADduction: AAROM;Right;10 reps Straight Leg Raises: AAROM;Right;10 reps   Assessment/Plan    PT Assessment Patient  needs continued PT services  PT Problem List Decreased strength;Decreased range of motion;Decreased mobility;Decreased balance;Decreased knowledge of precautions;Decreased knowledge of use of DME;Pain       PT Treatment Interventions Stair training;DME instruction;Gait training;Balance training;Therapeutic exercise;Functional mobility training;Therapeutic activities;Patient/family education    PT Goals (Current goals can be found in the Care Plan section)  Acute Rehab PT Goals PT Goal Formulation: With patient Time For Goal Achievement: 09/29/21 Potential to Achieve Goals: Good    Frequency 7X/week   Barriers to discharge        Co-evaluation               AM-PAC PT "6 Clicks" Mobility  Outcome Measure Help needed turning from your back to your side while in a flat bed without using bedrails?: A Little Help needed moving from lying on your back to sitting on the side of a flat bed without using bedrails?: A Little Help needed moving to and from a bed to a chair (including a wheelchair)?: A Little Help needed standing up from a chair using your arms (e.g., wheelchair or bedside chair)?: A Little Help needed to walk in hospital room?: A Little Help needed climbing 3-5 steps with a railing? : A Little 6 Click Score: 18    End of Session Equipment Utilized During Treatment: Gait belt Activity Tolerance: Patient tolerated treatment well Patient left: in chair;with call bell/phone within reach;with family/visitor present Nurse Communication: Mobility status PT Visit Diagnosis: Other abnormalities of gait and mobility (R26.89)    Time: 9470-9628 PT Time Calculation (min) (ACUTE ONLY): 27 min   Charges:   PT Evaluation $PT Eval Low Complexity: 1 Low PT Treatments $Therapeutic Exercise: 8-22 mins       Jannette Spanner PT, DPT Acute Rehabilitation Services Pager: [phone] Office: [place] 09/25/2021, 1:19 PM

## 2021-09-25 NOTE — Progress Notes (Signed)
Patient ID: Stephanie [name], female   DOB: December 25, 1955, 65 y.o.   MRN: [id number] Subjective: 1 Day Post-Op Procedure(s) (LRB): TOTAL KNEE REVISION, SAPHENOUS NEURECTOMY (Right)    Patient reports pain as moderate. No events over night  Objective:   VITALS:   Vitals:   09/25/21 0147 09/25/21 0611  BP: 107/63 (!) 109/59  Pulse: 77 67  Resp: 16 16  Temp: 98.1 F (36.7 C) 97.7 F (36.5 C)  SpO2: 97% 97%    Neurovascular intact Incision: dressing C/D/I  LABS Recent Labs    09/25/21 0323  HGB 9.7*  HCT 31.7*  WBC 12.1*  PLT 137*    Recent Labs    09/25/21 0323  NA 140  K 4.1  BUN 16  CREATININE 0.70  GLUCOSE 163*    No results for input(s): LABPT, INR in the last 72 hours.   Assessment/Plan: 1 Day Post-Op Procedure(s) (LRB): TOTAL KNEE REVISION, SAPHENOUS NEURECTOMY (Right)   Advance diet Up with therapy - PWB RLE due to revision components placed CPM 0- 60 as tolerated and increase 5 degrees as tolerable  Will order CPM for home  Plan for single dose XRT today for HO prevention and attempt to reduce post-op scarring  D/C likely tomorrow after therapy Out patient PT will be ordered

## 2021-09-25 NOTE — Consult Note (Signed)
Radiation Oncology         [phone]) [phone] ________________________________  Name: Stephanie [name] MRN: [id number]  Date of Service: 09/25/21 DOB: 10-16-1955  DP:[id number], Stephanie Kocher, MD  No ref. provider found     REFERRING PHYSICIAN: Dr. Alvan Dame   DIAGNOSIS: Failure of prior right knee arthroplasty  HISTORY OF PRESENT ILLNESS:Stephanie [name] is a 65 y.o. 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 She underwent replacement with Dr. Theda [name] in December 2021 but developed functional disability that did not respond to conservative intervention. She has undergone revision of her right knee yesterday with Dr. Alvan Dame, and we have been asked to see the patient today for consideration of postoperative radiation treatment for the prevention of heterotopic ossification postoperatively.   PREVIOUS RADIATION THERAPY: No   PAST MEDICAL HISTORY:  has a past medical history of Diverticulitis (01/21/2013), History of kidney stones, and Nephrolithiasis.     PAST SURGICAL HISTORY: Past Surgical History:  Procedure Laterality Date   CHOLECYSTECTOMY  10/14/1980   JOINT REPLACEMENT Right 09/28/2020   knee   KNEE ARTHROSCOPY Right 02/2020   MANIPULATION KNEE JOINT Right 12/2020   TONSILECTOMY, ADENOIDECTOMY, BILATERAL MYRINGOTOMY AND TUBES     at age 32 approx.    TUBAL LIGATION  10/15/1991     FAMILY HISTORY: family history includes Alcohol abuse in her father; COPD in her father; Cancer in her father and mother; Coronary artery disease in an other family member; Hyperlipidemia in her father and mother; Prostate cancer in her father.   SOCIAL HISTORY:  reports that she quit smoking about 44 years ago. Her smoking use included cigarettes. She has a 0.40 pack-year smoking history. She has never used smokeless tobacco. She reports that she does not drink alcohol and does not use drugs. The patient is married and  lives in [place], [place]. 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   ALLERGIES: Duloxetine   MEDICATIONS:  Current Facility-Administered Medications  Medication Dose Route Frequency Provider Last Rate Last Admin   0.9 %  sodium chloride infusion   Intravenous Continuous Stephanie Copas, PA-C   Stopped at 09/25/21 1443   acetaminophen (TYLENOL) tablet 325-650 mg  325-650 mg Oral Q6H PRN Stephanie Copas, PA-C       aspirin chewable tablet 81 mg  81 mg Oral BID Stephanie Copas, PA-C   81 mg at 09/25/21 1540   bisacodyl (DULCOLAX) suppository 10 mg  10 mg Rectal Daily PRN Stephanie Copas, PA-C       celecoxib (CELEBREX) capsule 200 mg  200 mg Oral BID Stephanie Copas, PA-C   200 mg at 09/25/21 0867   diphenhydrAMINE (BENADRYL) 12.5 MG/5ML elixir 12.5-25 mg  12.5-25 mg Oral Q4H PRN Stephanie Copas, PA-C       docusate sodium (COLACE) capsule 100 mg  100 mg Oral BID Stephanie Copas, PA-C   100 mg at 09/25/21 6195   ferrous sulfate tablet 325 mg  325 mg Oral TID PC Stephanie Copas, PA-C   325 mg at 09/25/21 1814   gabapentin (NEURONTIN) capsule 300 mg  300 mg Oral BID Stephanie Copas, PA-C   300 mg at 09/25/21 0932   HYDROmorphone (DILAUDID) injection 0.5-1 mg  0.5-1 mg Intravenous Q4H PRN Stephanie Copas, PA-C       menthol-cetylpyridinium (CEPACOL) lozenge 3 mg  1 lozenge Oral PRN Stephanie Copas, PA-C  Or   phenol (CHLORASEPTIC) mouth spray 1 spray  1 spray Mouth/Throat PRN Stephanie Copas, PA-C       methocarbamol (ROBAXIN) tablet 500 mg  500 mg Oral Q6H PRN Stephanie Copas, PA-C   500 mg at 09/25/21 3244   Or   methocarbamol (ROBAXIN) 500 mg in dextrose 5 % 50 mL IVPB  500 mg Intravenous Q6H PRN Stephanie Copas, PA-C   Stopped at 09/24/21 1730   metoCLOPramide (REGLAN) tablet 5-10 mg  5-10 mg Oral Q8H PRN Stephanie Copas, PA-C       Or   metoCLOPramide (REGLAN) injection 5-10 mg  5-10 mg Intravenous Q8H PRN Stephanie Copas, PA-C       ondansetron (ZOFRAN) tablet 4 mg   4 mg Oral Q6H PRN Stephanie Copas, PA-C       Or   ondansetron [facility]) injection 4 mg  4 mg Intravenous Q6H PRN Stephanie Copas, PA-C       oxyCODONE (Oxy IR/ROXICODONE) immediate release tablet 10-15 mg  10-15 mg Oral Q4H PRN Stephanie Copas, PA-C   10 mg at 09/25/21 0102   oxyCODONE (Oxy IR/ROXICODONE) immediate release tablet 5-10 mg  5-10 mg Oral Q4H PRN Stephanie Copas, PA-C   5 mg at 09/25/21 1814   polyethylene glycol (MIRALAX / GLYCOLAX) packet 17 g  17 g Oral Daily PRN Stephanie Copas, PA-C         REVIEW OF SYSTEMS:  On review of systems, the patient reports that she is doing well overall. She feels that her knee pain is improved in the last 12 hours. No other specific complaints are noted.     PHYSICAL EXAM:  Seen inpt vitals  In general this is a well appearing caucasian female in no acute distress. She's alert and oriented x4 and appropriate throughout the examination. Cardiopulmonary assessment is negative for acute distress and she exhibits normal effort. Her surgical site was not disrupted.  ECOG: 2  0 - Asymptomatic (Fully active, able to carry on all predisease activities without restriction)  1 - Symptomatic but completely ambulatory (Restricted in physically strenuous activity but ambulatory and able to carry out work of a light or sedentary nature. For example, light housework, office work)  2 - Symptomatic, <50% in bed during the day (Ambulatory and capable of all self care but unable to carry out any work activities. Up and about more than 50% of waking hours)  3 - Symptomatic, >50% in bed, but not bedbound (Capable of only limited self-care, confined to bed or chair 50% or more of waking hours)  4 - Bedbound (Completely disabled. Cannot carry on any self-care. Totally confined to bed or chair)  5 - Death   Eustace Pen MM, Creech RH, Tormey DC, et al. [phone]). "Toxicity and response criteria of the [facility]". [place] Oncol. 5  (6): 649-55   LABORATORY DATA:  Lab Results  Component Value Date   WBC 12.1 (H) 09/25/2021   HGB 9.7 (L) 09/25/2021   HCT 31.7 (L) 09/25/2021   MCV 89.0 09/25/2021   PLT 137 (L) 09/25/2021   Lab Results  Component Value Date   NA 140 09/25/2021   K 4.1 09/25/2021   CL 109 09/25/2021   CO2 24 09/25/2021   Lab Results  Component Value Date   ALT 15 09/18/2021   AST 17 09/18/2021   ALKPHOS 132 (H) 09/18/2021   BILITOT 0.5 09/18/2021  RADIOGRAPHY: No results found.     IMPRESSION/PLAN:  The patient has been diagnosed with a failed right arthroplasty and is felt to be a good candidate for one fraction of postoperative radiation treatment for the prevention of the development of heterotopic ossification.  I have discussed the rationale of this treatment with the patient. I have discussed the possible/expected benefit of such a treatment. I have also discussed the possible side effects and risks of treatment as well. All of the patient's questions have been answered. She will undergo simulation and one fraction of external beam radiation treatment.This will be completed to a dose of 7 Gy. This treatment will be completed on postoperative day #1.       Carola Rhine, [facility]    **Disclaimer: This note was dictated with voice recognition software. Similar sounding words can inadvertently be transcribed and this note may contain transcription errors which may not have been corrected upon publication of note.**

## 2021-09-25 NOTE — Progress Notes (Signed)
   Subjective: 1 Day Post-Op Procedure(s) (LRB): TOTAL KNEE REVISION, SAPHENOUS NEURECTOMY (Right) Patient reports pain as mild.   Patient seen in rounds by Dr. Alvan Dame. Patient is well, and has had no acute complaints or problems. No acute events overnight. Foley catheter removed. Patient used CPM yesterday.  We will start therapy today.   Objective: Vital signs in last 24 hours: Temp:  [97.7 F (36.5 C)-98.1 F (36.7 C)] 97.7 F (36.5 C) (12/13 0611) Pulse Rate:  [53-88] 67 (12/13 0611) Resp:  [11-27] 16 (12/13 0611) BP: (104-134)/(54-105) 109/59 (12/13 0611) SpO2:  [96 %-100 %] 97 % (12/13 0611) Weight:  [91.6 kg] 91.6 kg (12/12 1039)  Intake/Output from previous day:  Intake/Output Summary (Last 24 hours) at 09/25/2021 0705 Last data filed at 09/25/2021 4967 Gross per 24 hour  Intake 2755.45 ml  Output 2070 ml  Net 685.45 ml     Intake/Output this shift: No intake/output data recorded.  Labs: Recent Labs    09/25/21 0323  HGB 9.7*   Recent Labs    09/25/21 0323  WBC 12.1*  RBC 3.56*  HCT 31.7*  PLT 137*   Recent Labs    09/25/21 0323  NA 140  K 4.1  CL 109  CO2 24  BUN 16  CREATININE 0.70  GLUCOSE 163*  CALCIUM 8.5*   No results for input(s): LABPT, INR in the last 72 hours.  Exam: General - Patient is Alert and Oriented Extremity - Neurologically intact Sensation intact distally Intact pulses distally Dorsiflexion/Plantar flexion intact Dressing - dressing C/D/I Motor Function - intact, moving foot and toes well on exam.   Past Medical History:  Diagnosis Date   Diverticulitis 01/21/2013   History of kidney stones    Nephrolithiasis     Assessment/Plan: 1 Day Post-Op Procedure(s) (LRB): TOTAL KNEE REVISION, SAPHENOUS NEURECTOMY (Right) Principal Problem:   S/P revision of total knee, right  Estimated body mass index is 36.95 kg/m as calculated from the following:   Height as of this encounter: 5\' 2"  (1.575 m).   Weight as of  this encounter: 91.6 kg. Advance diet Up with therapy D/C IV fluids    DVT Prophylaxis - Aspirin PWB RLE 50%  Plan is to go Home after hospital stay. Plan for discharge today following 1-2 sessions of PT as long as they are meeting their goals. Patient is scheduled for OPPT. Follow up in the office in 2 weeks.   Will plan to arrange home CPM machine prior to discharge.   Griffith Citron, PA-C Orthopedic Surgery [phone] 09/25/2021, 7:05 AM

## 2021-09-25 NOTE — Progress Notes (Signed)
Physical Therapy Treatment Patient Details Name: Stephanie [name] MRN: [id number] DOB: 13-Oct-1956 Today's Date: 09/25/2021   History of Present Illness Pt is a 65 year old female s/p RIGHT TOTAL KNEE REVISION, SAPHENOUS NEURECTOMY on 09/24/21    PT Comments    Pt ambulated in hallway again and then assisted back to bed.  Pt reports a little more pain at superior, anterior knee this afternoon.  Pt requesting to have CPM again today.  Ortho tech called however no answer so RN made aware and will attempt to contact.   Recommendations for follow up therapy are one component of a multi-disciplinary discharge planning process, led by the attending physician.  Recommendations may be updated based on patient status, additional functional criteria and insurance authorization.  Follow Up Recommendations  Follow physician's recommendations for discharge plan and follow up therapies     Assistance Recommended at Discharge PRN  Equipment Recommendations  None recommended by PT    Recommendations for Other Services       Precautions / Restrictions Precautions Precautions: Fall;Knee Restrictions Weight Bearing Restrictions: Yes RLE Weight Bearing: Partial weight bearing RLE Partial Weight Bearing Percentage or Pounds: 50%     Mobility  Bed Mobility Overal bed mobility: Needs Assistance Bed Mobility: Supine to Sit;Sit to Supine     Supine to sit: Supervision;HOB elevated Sit to supine: Supervision;HOB elevated        Transfers Overall transfer level: Needs assistance Equipment used: Rolling walker (2 wheels) Transfers: Sit to/from Stand Sit to Stand: Min guard           General transfer comment: verbal cues for UE and LE positioning, maintaining PWB status    Ambulation/Gait Ambulation/Gait assistance: Min guard Gait Distance (Feet): 200 Feet Assistive device: Rolling walker (2 wheels) Gait Pattern/deviations: Step-to pattern;Decreased stance time - right;Antalgic Gait  velocity: decr     General Gait Details: verbal cues for sequence, RW positioning, step length, posture   Stairs             Wheelchair Mobility    Modified Rankin (Stroke Patients Only)       Balance                                            Cognition Arousal/Alertness: Awake/alert Behavior During Therapy: WFL for tasks assessed/performed Overall Cognitive Status: Within Functional Limits for tasks assessed                                          Exercises   General Comments        Pertinent Vitals/Pain Pain Assessment: 0-10 Pain Score: 5  Pain Location: right knee Pain Descriptors / Indicators: Aching;Sore Pain Intervention(s): Repositioned;Monitored during session;Ice applied    Home Living Family/patient expects to be discharged to:: Private residence Living Arrangements: Spouse/significant other Available Help at Discharge: Family Type of Home: House Home Access: Ramped entrance       [place]: One level [place]: Conservation officer, nature (2 wheels)      Prior Function            PT Goals (current goals can now be found in the care plan section) Acute Rehab PT Goals PT Goal Formulation: With patient Time For Goal Achievement: 09/29/21 Potential to Achieve Goals: Good Progress  towards PT goals: Progressing toward goals    Frequency    7X/week      PT Plan Current plan remains appropriate    Co-evaluation              AM-PAC PT "6 Clicks" Mobility   Outcome Measure  Help needed turning from your back to your side while in a flat bed without using bedrails?: A Little Help needed moving from lying on your back to sitting on the side of a flat bed without using bedrails?: A Little Help needed moving to and from a bed to a chair (including a wheelchair)?: A Little Help needed standing up from a chair using your arms (e.g., wheelchair or bedside chair)?: A Little Help needed to walk in  hospital room?: A Little Help needed climbing 3-5 steps with a railing? : A Little 6 Click Score: 18    End of Session Equipment Utilized During Treatment: Gait belt Activity Tolerance: Patient tolerated treatment well Patient left: with call bell/phone within reach;in bed;with bed alarm set;with family/visitor present Nurse Communication: Mobility status PT Visit Diagnosis: Other abnormalities of gait and mobility (R26.89)     Time: 8003-4917 PT Time Calculation (min) (ACUTE ONLY): 16 min  Charges:  $Gait Training: 8-22 mins                    Arlyce Dice, DPT Acute Rehabilitation Services Pager: [phone] Office: [place] 09/25/2021, 4:46 PM

## 2021-09-25 NOTE — Plan of Care (Signed)

## 2021-09-25 NOTE — Plan of Care (Signed)
Plan of care reviewed and discussed with the patient. 

## 2021-09-25 NOTE — TOC Transition Note (Signed)
Transition of Care [facility]) - CM/SW Discharge Note   Patient Details  Name: Stephanie [name] MRN: [id number] Date of Birth: 11/05/1955  [facility] CM/SW Contact:  Lennart Pall, LCSW Phone Number: 09/25/2021, 9:59 AM   Clinical Narrative:    Met with pt and confirming need for CPM for home use - referral placed with [facility].  Plan for OPPT at [facility].  No further TOC needs.   Final next level of care: OP Rehab Barriers to Discharge: No Barriers Identified   Patient Goals and CMS Choice Patient states their goals for this hospitalization and ongoing recovery are:: return home      Discharge Placement                       Discharge Plan and Services                DME Arranged: CPM DME Agency: [employer] Date DME Agency Contacted: 09/25/21 Time DME Agency Contacted: [phone] Representative spoke with at DME Agency: [place] ([place]) Interventions     Readmission Risk Interventions No flowsheet data found.

## 2021-09-26 DIAGNOSIS — T8484XA Pain due to internal orthopedic prosthetic devices, implants and grafts, initial encounter: Secondary | ICD-10-CM | POA: Diagnosis not present

## 2021-09-26 LAB — CBC
HCT: 27.6 % — ABNORMAL LOW (ref 36.0–46.0)
Hemoglobin: 8.6 g/dL — ABNORMAL LOW (ref 12.0–15.0)
MCH: 27.2 pg (ref 26.0–34.0)
MCHC: 31.2 g/dL (ref 30.0–36.0)
MCV: 87.3 fL (ref 80.0–100.0)
Platelets: 122 10*3/uL — ABNORMAL LOW (ref 150–400)
RBC: 3.16 MIL/uL — ABNORMAL LOW (ref 3.87–5.11)
RDW: 13.2 % (ref 11.5–15.5)
WBC: 11.4 10*3/uL — ABNORMAL HIGH (ref 4.0–10.5)
nRBC: 0 % (ref 0.0–0.2)

## 2021-09-26 NOTE — Progress Notes (Signed)
° °  Subjective: 2 Days Post-Op Procedure(s) (LRB): TOTAL KNEE REVISION, SAPHENOUS NEURECTOMY (Right) Patient reports pain as mild.   Patient seen in rounds with Dr. Alvan Dame. Patient is well, and has had no acute complaints or problems. She had her dose of radiation yesterday. Ambulated 160+ feet with PT yesterday. She is ready to go home today.  Plan is to go Home after hospital stay.  Objective: Vital signs in last 24 hours: Temp:  [97.8 F (36.6 C)-98.1 F (36.7 C)] 98.1 F (36.7 C) (12/14 0428) Pulse Rate:  [57-61] 61 (12/14 0428) Resp:  [14-18] 18 (12/14 0428) BP: (101-105)/(41-48) 103/44 (12/14 0428) SpO2:  [95 %-96 %] 95 % (12/14 0428)  Intake/Output from previous day:  Intake/Output Summary (Last 24 hours) at 09/26/2021 0753 Last data filed at 09/25/2021 1900 Gross per 24 hour  Intake 674.54 ml  Output 450 ml  Net 224.54 ml    Intake/Output this shift: No intake/output data recorded.  Labs: Recent Labs    09/25/21 0323 09/26/21 0316  HGB 9.7* 8.6*   Recent Labs    09/25/21 0323 09/26/21 0316  WBC 12.1* 11.4*  RBC 3.56* 3.16*  HCT 31.7* 27.6*  PLT 137* 122*   Recent Labs    09/25/21 0323  NA 140  K 4.1  CL 109  CO2 24  BUN 16  CREATININE 0.70  GLUCOSE 163*  CALCIUM 8.5*   No results for input(s): LABPT, INR in the last 72 hours.  Exam: General - Patient is Alert and Oriented Extremity - Neurologically intact Sensation intact distally Intact pulses distally Dorsiflexion/Plantar flexion intact Dressing/Incision - clean, dry, intact Motor Function - intact, moving foot and toes well on exam.   Past Medical History:  Diagnosis Date   Diverticulitis 01/21/2013   History of kidney stones    Nephrolithiasis     Assessment/Plan: 2 Days Post-Op Procedure(s) (LRB): TOTAL KNEE REVISION, SAPHENOUS NEURECTOMY (Right) Principal Problem:   S/P revision of total knee, right  Estimated body mass index is 36.95 kg/m as calculated from the  following:   Height as of this encounter: 5\' 2"  (1.575 m).   Weight as of this encounter: 91.6 kg. Advance diet Up with therapy D/C IV fluids  DVT Prophylaxis - Aspirin PWB RLE 50%  CPM machine ordered.  Plan to get up with PT again today. Discharge home after 1-2 sessions as long as she is meeting her goals. Follow up in the office in 2 weeks.   Griffith Citron, PA-C Orthopedic Surgery [phone] 09/26/2021, 7:53 AM

## 2021-09-26 NOTE — Progress Notes (Signed)
Orthopedic Tech Progress Note Patient Details:  Stephanie [name] December 17, 1955 [id number]  Patient ID: [name], female   DOB: Jan 26, 1956, 65 y.o.   MRN: [id number]  Kennis Carina 09/26/2021, 11:23 AM Pt placed in cpm

## 2021-09-26 NOTE — Progress Notes (Signed)
Physical Therapy Treatment Patient Details Name: Stephanie [name] MRN: [id number] DOB: Jun 24, 1956 Today's Date: 09/26/2021   History of Present Illness Pt is a 65 year old female s/p RIGHT TOTAL KNEE REVISION, SAPHENOUS NEURECTOMY on 09/24/21    PT Comments    Pt ambulated in hallway and performed LE exercises.  Pt awaiting measurements for CPM to be delivered at home.     Recommendations for follow up therapy are one component of a multi-disciplinary discharge planning process, led by the attending physician.  Recommendations may be updated based on patient status, additional functional criteria and insurance authorization.  Follow Up Recommendations  Follow physician's recommendations for discharge plan and follow up therapies     Assistance Recommended at Discharge PRN  Equipment Recommendations  None recommended by PT    Recommendations for Other Services       Precautions / Restrictions Precautions Precautions: Fall;Knee Restrictions Weight Bearing Restrictions: Yes RLE Weight Bearing: Partial weight bearing RLE Partial Weight Bearing Percentage or Pounds: 50%     Mobility  Bed Mobility Overal bed mobility: Needs Assistance Bed Mobility: Supine to Sit;Sit to Supine     Supine to sit: Supervision;HOB elevated Sit to supine: Supervision;HOB elevated   General bed mobility comments: increased time and effort however pt able to self assist    Transfers Overall transfer level: Needs assistance Equipment used: Rolling walker (2 wheels) Transfers: Sit to/from Stand Sit to Stand: Min guard           General transfer comment: verbal cues for UE and LE positioning    Ambulation/Gait Ambulation/Gait assistance: Min guard Gait Distance (Feet): 200 Feet Assistive device: Rolling walker (2 wheels) Gait Pattern/deviations: Step-to pattern;Decreased stance time - right;Antalgic Gait velocity: decr     General Gait Details: verbal cues for sequence, RW  positioning, step length, posture; distance per pt tolerance (pt reports more soreness today however states a little improved with ambulating)   Stairs             Wheelchair Mobility    Modified Rankin (Stroke Patients Only)       Balance                                            Cognition Arousal/Alertness: Awake/alert Behavior During Therapy: WFL for tasks assessed/performed Overall Cognitive Status: Within Functional Limits for tasks assessed                                          Exercises Total Joint Exercises Ankle Circles/Pumps: AROM;Both;10 reps Quad Sets: AROM;Both;10 reps Heel Slides: 10 reps;AAROM;Right;Seated Hip ABduction/ADduction: AAROM;Right;10 reps Straight Leg Raises: AAROM;Right;10 reps    General Comments        Pertinent Vitals/Pain Pain Assessment: 0-10 Pain Score: 7  Pain Location: right knee Pain Descriptors / Indicators: Aching;Sore Pain Intervention(s): RN gave pain meds during session;Monitored during session;Repositioned    Home Living                          Prior Function            PT Goals (current goals can now be found in the care plan section) Progress towards PT goals: Progressing toward goals    Frequency  7X/week      PT Plan Current plan remains appropriate    Co-evaluation              AM-PAC PT "6 Clicks" Mobility   Outcome Measure  Help needed turning from your back to your side while in a flat bed without using bedrails?: A Little Help needed moving from lying on your back to sitting on the side of a flat bed without using bedrails?: A Little Help needed moving to and from a bed to a chair (including a wheelchair)?: A Little Help needed standing up from a chair using your arms (e.g., wheelchair or bedside chair)?: A Little Help needed to walk in hospital room?: A Little Help needed climbing 3-5 steps with a railing? : A Little 6 Click  Score: 18    End of Session Equipment Utilized During Treatment: Gait belt Activity Tolerance: Patient tolerated treatment well Patient left: with call bell/phone within reach;in bed;with bed alarm set;with family/visitor present Nurse Communication: Mobility status PT Visit Diagnosis: Other abnormalities of gait and mobility (R26.89)     Time: 3546-5681 PT Time Calculation (min) (ACUTE ONLY): 29 min  Charges:  $Gait Training: 8-22 mins $Therapeutic Exercise: 8-22 mins                     Jannette Spanner PT, DPT Acute Rehabilitation Services Pager: [phone] Office: [place] 09/26/2021, 1:38 PM

## 2021-09-26 NOTE — Plan of Care (Signed)
?  Problem: Activity: ?Goal: Risk for activity intolerance will decrease ?Outcome: Progressing ?  ?Problem: Safety: ?Goal: Ability to remain free from injury will improve ?Outcome: Progressing ?  ?Problem: Pain Managment: ?Goal: General experience of comfort will improve ?Outcome: Progressing ?  ?

## 2021-09-26 NOTE — Plan of Care (Signed)

## 2021-09-27 ENCOUNTER — Encounter (HOSPITAL_COMMUNITY): Payer: Self-pay | Admitting: Orthopedic Surgery

## 2021-09-27 DIAGNOSIS — T8484XA Pain due to internal orthopedic prosthetic devices, implants and grafts, initial encounter: Secondary | ICD-10-CM | POA: Diagnosis not present

## 2021-09-27 LAB — CBC
HCT: 28.7 % — ABNORMAL LOW (ref 36.0–46.0)
Hemoglobin: 9 g/dL — ABNORMAL LOW (ref 12.0–15.0)
MCH: 27.4 pg (ref 26.0–34.0)
MCHC: 31.4 g/dL (ref 30.0–36.0)
MCV: 87.5 fL (ref 80.0–100.0)
Platelets: 130 10*3/uL — ABNORMAL LOW (ref 150–400)
RBC: 3.28 MIL/uL — ABNORMAL LOW (ref 3.87–5.11)
RDW: 13.4 % (ref 11.5–15.5)
WBC: 6.5 10*3/uL (ref 4.0–10.5)
nRBC: 0 % (ref 0.0–0.2)

## 2021-09-27 MED ORDER — SODIUM CHLORIDE 0.9 % IV BOLUS
250.0000 mL | Freq: Once | INTRAVENOUS | Status: AC
  Administered 2021-09-27: 250 mL via INTRAVENOUS

## 2021-09-27 NOTE — Clinical Social Work Note (Signed)
[employer] to set up CPM at home after patient discharges.

## 2021-09-27 NOTE — Progress Notes (Signed)
Discharge package printed and instructions given to patient. Patient verbalizes understanding. 

## 2021-09-27 NOTE — Progress Notes (Signed)
° °  Subjective: 3 Days Post-Op Procedure(s) (LRB): TOTAL KNEE REVISION, SAPHENOUS NEURECTOMY (Right) Patient reports pain as mild.   Patient seen in rounds for Dr. Alvan Dame. Patient is well, and has had no acute complaints or problems. No acute events overnight. Voiding without difficulty. Patient ambulated 200 feet with PT. She denies abdominal pain, N/V.  We will start therapy today.   Objective: Vital signs in last 24 hours: Temp:  [97.9 F (36.6 C)-98.7 F (37.1 C)] 98.5 F (36.9 C) (12/15 0504) Pulse Rate:  [65-69] 65 (12/15 0504) Resp:  [16-18] 16 (12/15 0504) BP: (105-108)/(34-57) 105/57 (12/15 0504) SpO2:  [97 %-100 %] 98 % (12/15 0504)  Intake/Output from previous day:  Intake/Output Summary (Last 24 hours) at 09/27/2021 0737 Last data filed at 09/26/2021 1853 Gross per 24 hour  Intake 600 ml  Output --  Net 600 ml     Intake/Output this shift: No intake/output data recorded.  Labs: Recent Labs    09/25/21 0323 09/26/21 0316  HGB 9.7* 8.6*   Recent Labs    09/25/21 0323 09/26/21 0316  WBC 12.1* 11.4*  RBC 3.56* 3.16*  HCT 31.7* 27.6*  PLT 137* 122*   Recent Labs    09/25/21 0323  NA 140  K 4.1  CL 109  CO2 24  BUN 16  CREATININE 0.70  GLUCOSE 163*  CALCIUM 8.5*   No results for input(s): LABPT, INR in the last 72 hours.  Exam: General - Patient is Alert and Oriented Extremity - Neurologically intact Sensation intact distally Intact pulses distally Dorsiflexion/Plantar flexion intact Dressing - dressing C/D/I Motor Function - intact, moving foot and toes well on exam.   Past Medical History:  Diagnosis Date   Diverticulitis 01/21/2013   History of kidney stones    Nephrolithiasis     Assessment/Plan: 3 Days Post-Op Procedure(s) (LRB): TOTAL KNEE REVISION, SAPHENOUS NEURECTOMY (Right) Principal Problem:   S/P revision of total knee, right  Estimated body mass index is 36.95 kg/m as calculated from the following:   Height as of  this encounter: 5\' 2"  (1.575 m).   Weight as of this encounter: 91.6 kg. Advance diet Up with therapy D/C IV fluids   DVT Prophylaxis - Aspirin Weight bearing as tolerated.  Patient BP has been soft, will recheck CBC. Give fluid bolus this morning, encouraged PO fluids.   Waiting on info regarding home CPM arrangements.   Plan is to go Home after hospital stay. Plan for discharge today following 1-2 sessions of PT as long as they are meeting their goals. Patient is scheduled for OPPT. Follow up in the office in 2 weeks.   Griffith Citron, PA-C Orthopedic Surgery [phone] 09/27/2021, 7:37 AM

## 2021-09-27 NOTE — Plan of Care (Signed)
  Problem: Activity: Goal: Risk for activity intolerance will decrease Outcome: Progressing   Problem: Pain Managment: Goal: General experience of comfort will improve Outcome: Progressing   Problem: Safety: Goal: Ability to remain free from injury will improve Outcome: Progressing   

## 2021-09-27 NOTE — Progress Notes (Signed)
Physical Therapy Treatment Patient Details Name: Stephanie [name] MRN: [id number] DOB: [birth year] Today's Date: 09/27/2021   History of Present Illness Pt is a 65 year old female s/p RIGHT TOTAL KNEE REVISION, SAPHENOUS NEURECTOMY on 09/24/21    PT Comments    Pt ambulated in hallway and then assist to bathroom per request.  Pt agreeable to perform exercises at home to conserve energy.  Pt ready for d/c home today.  Pt and spouse had no further questions.   Recommendations for follow up therapy are one component of a multi-disciplinary discharge planning process, led by the attending physician.  Recommendations may be updated based on patient status, additional functional criteria and insurance authorization.  Follow Up Recommendations  Follow physician's recommendations for discharge plan and follow up therapies     Assistance Recommended at Discharge PRN  Equipment Recommendations  None recommended by PT    Recommendations for Other Services       Precautions / Restrictions Precautions Precautions: Fall;Knee Restrictions Weight Bearing Restrictions: Yes RLE Weight Bearing: Partial weight bearing RLE Partial Weight Bearing Percentage or Pounds: 50%     Mobility  Bed Mobility Overal bed mobility: Needs Assistance Bed Mobility: Supine to Sit     Supine to sit: Supervision;HOB elevated     General bed mobility comments: increased time and effort however pt able to self assist    Transfers Overall transfer level: Needs assistance Equipment used: Rolling walker (2 wheels) Transfers: Sit to/from Stand Sit to Stand: Min guard;Supervision           General transfer comment: able to recall good positioning    Ambulation/Gait Ambulation/Gait assistance: Min guard;Supervision Gait Distance (Feet): 160 Feet Assistive device: Rolling walker (2 wheels) Gait Pattern/deviations: Step-to pattern;Decreased stance time - right;Antalgic       General Gait Details: verbal  cues for RW positioning, step length, posture; distance per pt as pt wishing to return to room to use bathroom   Stairs             Wheelchair Mobility    Modified Rankin (Stroke Patients Only)       Balance                                            Cognition Arousal/Alertness: Awake/alert Behavior During Therapy: WFL for tasks assessed/performed Overall Cognitive Status: Within Functional Limits for tasks assessed                                          Exercises      General Comments        Pertinent Vitals/Pain Pain Assessment: 0-10 Pain Score: 4  Pain Location: right knee Pain Descriptors / Indicators: Aching;Sore Pain Intervention(s): Repositioned;Premedicated before session;RN gave pain meds during session    Home Living                          Prior Function            PT Goals (current goals can now be found in the care plan section) Progress towards PT goals: Progressing toward goals    Frequency    7X/week      PT Plan Current plan remains appropriate    Co-evaluation  AM-PAC PT "6 Clicks" Mobility   Outcome Measure  Help needed turning from your back to your side while in a flat bed without using bedrails?: A Little Help needed moving from lying on your back to sitting on the side of a flat bed without using bedrails?: A Little Help needed moving to and from a bed to a chair (including a wheelchair)?: A Little Help needed standing up from a chair using your arms (e.g., wheelchair or bedside chair)?: A Little Help needed to walk in hospital room?: A Little Help needed climbing 3-5 steps with a railing? : A Little 6 Click Score: 18    End of Session Equipment Utilized During Treatment: Gait belt Activity Tolerance: Patient tolerated treatment well Patient left: with family/visitor present (in bathroom with spouse in room, aware to call for assist)   PT Visit  Diagnosis: Other abnormalities of gait and mobility (R26.89)     Time: 4174-0814 PT Time Calculation (min) (ACUTE ONLY): 19 min  Charges:  $Gait Training: 8-22 mins                    Arlyce Dice, DPT Acute Rehabilitation Services Pager: [phone] Office: [place] 09/27/2021, 5:29 PM

## 2021-10-01 NOTE — Progress Notes (Signed)
°  Radiation Oncology         [phone]) [phone] ________________________________  Name: Stephanie [name] MRN: [id number]  Date: 09/25/2021  DOB: 08-Mar-1956  End of Treatment Note  Diagnosis:   Postoperative heterotopic ossification     Indication for treatment:  curative       Radiation treatment dates:   09/25/21  Site/dose:   The patient was treated to a dose of 7 Gy to the right knee. This was accomplished with AP and PA fields.  Narrative: The patient tolerated radiation treatment relatively well.   No difficulties.  Plan: The patient has completed radiation treatment. The patient will return to radiation oncology clinic on an as needed basis. ________________________________    Carola Rhine, PAC

## 2021-10-05 ENCOUNTER — Telehealth: Payer: Self-pay | Admitting: Emergency Medicine

## 2021-10-05 ENCOUNTER — Emergency Department (INDEPENDENT_AMBULATORY_CARE_PROVIDER_SITE_OTHER): Payer: BC Managed Care – PPO

## 2021-10-05 ENCOUNTER — Emergency Department
Admission: EM | Admit: 2021-10-05 | Discharge: 2021-10-05 | Disposition: A | Discharge status: Home / Self Care | Payer: BC Managed Care – PPO | Source: Home / Self Care | Attending: Family Medicine | Admitting: Family Medicine

## 2021-10-05 ENCOUNTER — Ambulatory Visit: Payer: BC Managed Care – PPO | Admitting: Medical-Surgical

## 2021-10-05 ENCOUNTER — Other Ambulatory Visit: Payer: Self-pay

## 2021-10-05 ENCOUNTER — Emergency Department: Admit: 2021-10-05 | Discharge status: Absent without leave | Payer: Self-pay

## 2021-10-05 DIAGNOSIS — Z9889 Other specified postprocedural states: Secondary | ICD-10-CM

## 2021-10-05 DIAGNOSIS — R42 Dizziness and giddiness: Secondary | ICD-10-CM | POA: Diagnosis not present

## 2021-10-05 DIAGNOSIS — R0602 Shortness of breath: Secondary | ICD-10-CM

## 2021-10-05 LAB — CBC WITH DIFFERENTIAL/PLATELET
Absolute Monocytes: 529 cells/uL (ref 200–950)
Basophils Absolute: 32 cells/uL (ref 0–200)
Basophils Relative: 0.4 %
Eosinophils Absolute: 182 cells/uL (ref 15–500)
Eosinophils Relative: 2.3 %
HCT: 30.6 % — ABNORMAL LOW (ref 35.0–45.0)
Hemoglobin: 9.8 g/dL — ABNORMAL LOW (ref 11.7–15.5)
Lymphs Abs: 1122 cells/uL (ref 850–3900)
MCH: 27 pg (ref 27.0–33.0)
MCHC: 32 g/dL (ref 32.0–36.0)
MCV: 84.3 fL (ref 80.0–100.0)
MPV: 9.9 fL (ref 7.5–12.5)
Monocytes Relative: 6.7 %
Neutro Abs: 6036 cells/uL (ref 1500–7800)
Neutrophils Relative %: 76.4 %
Platelets: 332 10*3/uL (ref 140–400)
RBC: 3.63 10*6/uL — ABNORMAL LOW (ref 3.80–5.10)
RDW: 13 % (ref 11.0–15.0)
Total Lymphocyte: 14.2 %
WBC: 7.9 10*3/uL (ref 3.8–10.8)

## 2021-10-05 NOTE — Telephone Encounter (Signed)
CBC results called to [facility] after a review by Dr Assunta Found. No elevated white count. Hgb & Hct are low but increased from most recent labs s/p knee surgery. RN reinforced w/ Almyra Free that she should go to the ED if SOB is worsening - no other questions at this time.

## 2021-10-05 NOTE — Discharge Instructions (Signed)
Rest.  Increase fluid intake.  Check temperature daily.  If symptoms become significantly worse during the night or over the weekend, proceed to the local emergency room.

## 2021-10-05 NOTE — ED Triage Notes (Signed)
Pt states that she has some sob, fatigue, dizziness x1 week.  Pt states that she has been having symptoms off and on for 1 week. Pt states that she has had covid vaccine.  Pt states that she has had flu vaccine.

## 2021-10-05 NOTE — Discharge Summary (Signed)
Patient ID: Stephanie [name] MRN: [id number] DOB/AGE: August 12, 1956 65 y.o.  Admit date: 09/24/2021 Discharge date: 10/05/2021  Admission Diagnoses:  1.  Painful stiff right total knee arthroplasty. 2.  Hypersensitivity in the distribution of the saphenous nerve consistent with saphenous neuritis postoperatively.   Discharge Diagnoses:  Same  Past Medical History:  Diagnosis Date   Diverticulitis 01/21/2013   History of kidney stones    Nephrolithiasis     Surgeries: Procedure(s): TOTAL KNEE REVISION, SAPHENOUS NEURECTOMY on 09/24/2021   Consultants:   Discharged Condition: Improved  Hospital Course: Stephanie [name] is an 65 y.o. female who was admitted 09/24/2021 for operative treatment ofS/P revision of total knee, right. Patient has severe unremitting pain that affects sleep, daily activities, and work/hobbies. After pre-op clearance the patient was taken to the operating room on 09/24/2021 and underwent  Procedure(s): TOTAL KNEE REVISION, SAPHENOUS NEURECTOMY.    Patient was given perioperative antibiotics:  Anti-infectives (From admission, onward)    Start     Dose/Rate Route Frequency Ordered Stop   09/24/21 2000  ceFAZolin (ANCEF) IVPB 2g/100 mL premix        2 g 200 mL/hr over 30 Minutes Intravenous Every 6 hours 09/24/21 1846 09/25/21 0833   09/24/21 1030  ceFAZolin (ANCEF) IVPB 2g/100 mL premix        2 g 200 mL/hr over 30 Minutes Intravenous On call to O.R. 09/24/21 1018 09/24/21 1419        Patient was given sequential compression devices, early ambulation, and chemoprophylaxis to prevent DVT. Patient worked with PT and was meeting their goals regarding safe ambulation and transfers.  Patient benefited maximally from hospital stay and there were no complications.    Recent vital signs: No data found.   Recent laboratory studies: No results for input(s): WBC, HGB, HCT, PLT, NA, K, CL, CO2, BUN, CREATININE, GLUCOSE, INR, CALCIUM in the last 72 hours.  Invalid  input(s): PT, 2   Discharge Medications:   Allergies as of 09/27/2021       Reactions   Duloxetine Nausea Only        Medication List     STOP taking these medications    Ibuprofen 200 MG Caps       TAKE these medications    aspirin 81 MG chewable tablet Chew 1 tablet (81 mg total) by mouth 2 (two) times daily for 28 days.   celecoxib 200 MG capsule Commonly known as: CELEBREX Take 1 capsule (200 mg total) by mouth 2 (two) times daily.   docusate sodium 100 MG capsule Commonly known as: COLACE Take 1 capsule (100 mg total) by mouth 2 (two) times daily.   ferrous sulfate 325 (65 FE) MG tablet Take 1 tablet (325 mg total) by mouth 2 (two) times daily with a meal for 14 days.   gabapentin 300 MG capsule Commonly known as: NEURONTIN Take 300 mg by mouth in the morning and at bedtime.   methocarbamol 500 MG tablet Commonly known as: ROBAXIN Take 1 tablet (500 mg total) by mouth every 6 (six) hours as needed for muscle spasms.   oxyCODONE 5 MG immediate release tablet Commonly known as: Oxy IR/ROXICODONE Take 1-2 tablets (5-10 mg total) by mouth every 4 (four) hours as needed for severe pain.   polyethylene glycol 17 g packet Commonly known as: MIRALAX / GLYCOLAX Take 17 g by mouth daily as needed for mild constipation.               Discharge Care  Instructions  (From admission, onward)           Start     Ordered   09/25/21 0000  Change dressing       Comments: Maintain surgical dressing until follow up in the clinic. If the edges start to pull up, may reinforce with tape. If the dressing is no longer working, may remove and cover with gauze and tape, but must keep the area dry and clean.  Call with any questions or concerns.   09/25/21 0711            Diagnostic Studies: No results found.  Disposition: Discharge disposition: 01-Home or Self Care       Discharge Instructions     Call MD / Call 911   Complete by: As directed    If  you experience chest pain or shortness of breath, CALL 911 and be transported to the hospital emergency room.  If you develope a fever above 101 F, pus (white drainage) or increased drainage or redness at the wound, or calf pain, call your surgeon's office.   Change dressing   Complete by: As directed    Maintain surgical dressing until follow up in the clinic. If the edges start to pull up, may reinforce with tape. If the dressing is no longer working, may remove and cover with gauze and tape, but must keep the area dry and clean.  Call with any questions or concerns.   Constipation Prevention   Complete by: As directed    Drink plenty of fluids.  Prune juice may be helpful.  You may use a stool softener, such as Colace (over the counter) 100 mg twice a day.  Use MiraLax (over the counter) for constipation as needed.   Diet - low sodium heart healthy   Complete by: As directed    Increase activity slowly as tolerated   Complete by: As directed    Partial weight bearing with assist device as directed.   Post-operative opioid taper instructions:   Complete by: As directed    POST-OPERATIVE OPIOID TAPER INSTRUCTIONS: It is important to wean off of your opioid medication as soon as possible. If you do not need pain medication after your surgery it is ok to stop day one. Opioids include: Codeine, Hydrocodone(Norco, Vicodin), Oxycodone(Percocet, oxycontin) and hydromorphone amongst others.  Long term and even short term use of opiods can cause: Increased pain response Dependence Constipation Depression Respiratory depression And more.  Withdrawal symptoms can include Flu like symptoms Nausea, vomiting And more Techniques to manage these symptoms Hydrate well Eat regular healthy meals Stay active Use relaxation techniques(deep breathing, meditating, yoga) Do Not substitute Alcohol to help with tapering If you have been on opioids for less than two weeks and do not have pain than it is ok  to stop all together.  Plan to wean off of opioids This plan should start within one week post op of your joint replacement. Maintain the same interval or time between taking each dose and first decrease the dose.  Cut the total daily intake of opioids by one tablet each day Next start to increase the time between doses. The last dose that should be eliminated is the evening dose.      TED hose   Complete by: As directed    Use stockings (TED hose) for 2 weeks on both leg(s).  You may remove them at night for sleeping.        Follow-up Information  Paralee Cancel, MD. Schedule an appointment as soon as possible for a visit in 2 week(s).   Specialty: Orthopedic Surgery Contact information: [address] [place] [address] [phone]                  Signed: Irving Copas 10/05/2021, 9:39 AM

## 2021-10-05 NOTE — ED Provider Notes (Signed)
[facility]    CSN: [id number] Arrival date & time: 10/05/21  1013      History   Chief Complaint Chief Complaint  Patient presents with   Shortness of Breath    Shortness of breath, fatigue, dizziness. X1 week    HPI Stephanie [name] is a 65 y.o. female.   Patient underwent surgical revision of a right total knee replacement, and a saphenous neurectomy 8 days ago.  Patient states that during the past week, she has been fatigued, and has had intermittent shortness of breath and dizziness with movement.  She denies lower leg swelling/pain, and fevers, chills, and sweats.  She states that her right knee post-surgical pain/swelling has been decreasing.  She is relatively assymptomatic when sitting and supine. She has a follow-up with her orthopedic surgeon on 10/10/21  The history is provided by the patient and the spouse.   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   Trochanteric bursitis of left hip 09/22/2020   Lumbar spinal stenosis 10/04/2019   Primary osteoarthritis of right knee 03/03/2018   Cervical pain (neck) 10/02/2015   Achilles tendinitis of left lower extremity 09/06/2015   Paresthesia and pain of right extremity 09/06/2015   Splenic artery aneurysm ([place]) 01/21/2013   Diverticulitis of colon without hemorrhage 01/21/2013   COSTOCHONDRITIS 01/01/2011   DYSPNEA ON EXERTION 01/01/2011   BENIGN NEOPLASM OF OTHER SPECIFIED SITES 12/18/2009    Past Surgical History:  Procedure Laterality Date   CHOLECYSTECTOMY  10/14/1980   JOINT REPLACEMENT Right 09/28/2020   knee   KNEE ARTHROSCOPY Right 02/2020   MANIPULATION KNEE JOINT Right 12/2020   TONSILECTOMY, ADENOIDECTOMY, BILATERAL MYRINGOTOMY AND TUBES     at age 37 approx.    TOTAL  KNEE REVISION Right 09/24/2021   Procedure: TOTAL KNEE REVISION, SAPHENOUS NEURECTOMY;  Surgeon: Paralee Cancel, MD;  Location: [facility];  Service: Orthopedics;  Laterality: Right;  2 HRS   TUBAL LIGATION  10/15/1991    OB History     Gravida  2   Para  2   Term      Preterm      AB      Living         SAB      IAB      Ectopic      Multiple      Live Births  2            Home Medications    Prior to Admission medications   Medication Sig Start Date End Date Taking? Authorizing Provider  aspirin 81 MG chewable tablet Chew 1 tablet (81 mg total) by mouth 2 (two) times daily for 28 days. 09/25/21 10/23/21 Yes Irving Copas, PA-C  celecoxib (CELEBREX) 200 MG capsule Take 1 capsule (200 mg total) by mouth 2 (two) times daily. 09/25/21  Yes Irving Copas, PA-C  docusate sodium (COLACE) 100 MG capsule Take 1 capsule (100 mg total) by mouth 2 (two) times daily. 09/25/21  Yes Irving Copas, PA-C  ferrous sulfate 325 (65 FE) MG tablet Take 1 tablet (325 mg total) by mouth 2 (two) times daily with a meal for 14 days. 09/25/21 10/09/21 Yes Irving Copas, PA-C  gabapentin (NEURONTIN) 300  MG capsule Take 300 mg by mouth in the morning and at bedtime. 01/02/21  Yes [provider]  methocarbamol (ROBAXIN) 500 MG tablet Take 1 tablet (500 mg total) by mouth every 6 (six) hours as needed for muscle spasms. 09/25/21  Yes Irving Copas, PA-C  oxyCODONE (OXY IR/ROXICODONE) 5 MG immediate release tablet Take 1-2 tablets (5-10 mg total) by mouth every 4 (four) hours as needed for severe pain. 09/25/21  Yes Costella Hatcher R, PA-C  polyethylene glycol (MIRALAX / GLYCOLAX) 17 g packet Take 17 g by mouth daily as needed for mild constipation. 09/25/21   Irving Copas, PA-C    Family History Family History  Problem Relation Age of Onset   Coronary artery disease Other        family hx of/ heart valve replacement   Alcohol abuse Father    Cancer Father     Prostate cancer Father    COPD Father    Hyperlipidemia Father    Cancer Mother    Hyperlipidemia Mother    Colon cancer Neg Hx     Social History Social History   Tobacco Use   Smoking status: Former    Packs/day: 0.20    Years: 2.00    Pack years: 0.40    Types: Cigarettes    Quit date: 10/14/1976    Years since quitting: 45.0   Smokeless tobacco: Never  Vaping Use   Vaping Use: Never used  Substance Use Topics   Alcohol use: No   Drug use: No     Allergies   Duloxetine   Review of Systems Review of Systems  Constitutional:  Positive for activity change and fatigue. Negative for appetite change, chills, diaphoresis and fever.  HENT: Negative.    Eyes: Negative.   Respiratory:  Positive for shortness of breath. Negative for cough, chest tightness, wheezing and stridor.   Cardiovascular:  Negative for chest pain, palpitations and leg swelling.  Gastrointestinal: Negative.   Genitourinary: Negative.   Musculoskeletal:        Right knee swelling/pain improving.  Skin: Negative.   Neurological:  Positive for light-headedness. Negative for syncope, facial asymmetry, weakness, numbness and headaches.    Physical Exam Triage Vital Signs ED Triage Vitals  Enc Vitals Group     BP 10/05/21 1050 114/71     Pulse Rate 10/05/21 1050 82     Resp 10/05/21 1050 18     Temp 10/05/21 1050 98.1 F (36.7 C)     Temp Source 10/05/21 1050 Oral     SpO2 10/05/21 1050 97 %     Weight 10/05/21 1048 199 lb (90.3 kg)     Height 10/05/21 1048 5\' 2"  (1.575 m)     Head Circumference --      Peak Flow --      Pain Score 10/05/21 1048 0     Pain Loc --      Pain Edu? --      Excl. in [place]? --    No data found.  Updated Vital Signs BP 114/71 (BP Location: Left Arm)    Pulse 82    Temp 98.1 F (36.7 C) (Oral)    Resp 18    Ht 5\' 2"  (1.575 m)    Wt 90.3 kg    SpO2 97%    BMI 36.40 kg/m   Visual Acuity Right Eye Distance:   Left Eye Distance:   Bilateral Distance:    Right Eye  Near:   Left  Eye Near:    Bilateral Near:     Physical Exam Nursing notes and Vital Signs reviewed. Appearance:  Patient appears stated age, and in no acute distress.  She is alert and oriented.  Eyes:  Pupils are equal, round, and reactive to light and accomodation.  Extraocular movement is intact.  Conjunctivae are not inflamed  Ears:  Canals normal.  Tympanic membranes normal.  Nose:   Normal turbinates.  No sinus tenderness.    Pharynx:  Normal Neck:  Supple.  No adenopathy. Lungs:  Clear to auscultation.  Breath sounds are equal.  Moving air well. Heart:  Regular rate and rhythm without murmurs, rubs, or gallops.  Abdomen:  Nontender without masses or hepatosplenomegaly.  Bowel sounds are present.  No CVA or flank tenderness.  Extremities:  No edema.  Right knee minimally swollen with healing surgical wound; no erythema, drainage, or tenderness to palpation.  Both lower legs have no calf tenderness, warmth or swelling.  Both posterior thighs are nontender to palpation. Skin:  No rash present.   UC Treatments / Results  Labs (all labs ordered are listed, but only abnormal results are displayed) Labs Reviewed  CBC WITH DIFFERENTIAL/PLATELET    EKG   Radiology DG Chest 2 View  Result Date: 10/05/2021 CLINICAL DATA:  Shortness of breath, 8 days following knee replacement. Dizziness. EXAM: CHEST - 2 VIEW COMPARISON:  01/01/2011 FINDINGS: Heart size is normal. Mediastinal shadows are normal. The lungs are clear. No bronchial thickening. No infiltrate, mass, effusion or collapse. Pulmonary vascularity is normal. No bony abnormality. IMPRESSION: Normal chest Electronically Signed   By: Nelson Chimes M.D.   On: 10/05/2021 12:30    Procedures Procedures (including critical care time)  Medications Ordered in UC Medications - No data to display  Initial Impression / Assessment and Plan / UC Course  I have reviewed the triage vital signs and the nursing notes.  Pertinent labs &  imaging results that were available during my care of the patient were reviewed by me and considered in my medical decision making (see chart for details).     Benign exam.  Chest x-ray negative.  No evidence right knee post-op infection.  No evidence DVT on physical exam. Review of records from 09/27/21 reveals anemia:  Hgb 9.0  CBC pending. Followup with orthopedist as scheduled on 10/10/21. Final Clinical Impressions(s) / UC Diagnoses   Final diagnoses:  Shortness of breath     Discharge Instructions      Rest.  Increase fluid intake.  Check temperature daily.  If symptoms become significantly worse during the night or over the weekend, proceed to the local emergency room.     ED Prescriptions   None       Kandra Nicolas, MD 10/08/21 1216

## 2021-10-05 NOTE — ED Notes (Signed)
Pt walked over to primary care to cancel 1 pm appointment today w/ Joy. Information given to registrar

## 2021-10-17 NOTE — Addendum Note (Signed)
Addendum  created 10/17/21 1649 by Duane Boston, MD   Clinical Note Signed

## 2021-11-17 IMAGING — MG MM DIGITAL SCREENING BILAT W/ TOMO AND CAD
8 series · 8 of 24 positions shown · non-contrast
Comparison: Previous exam(s).

CLINICAL DATA: Screening.

EXAM:
DIGITAL SCREENING BILATERAL MAMMOGRAM WITH TOMOSYNTHESIS AND CAD
TECHNIQUE: Bilateral screening digital craniocaudal and mediolateral oblique
mammograms were obtained. Bilateral screening digital breast
tomosynthesis was performed. The images were evaluated with
computer-aided detection.

[R CC synth-2D]
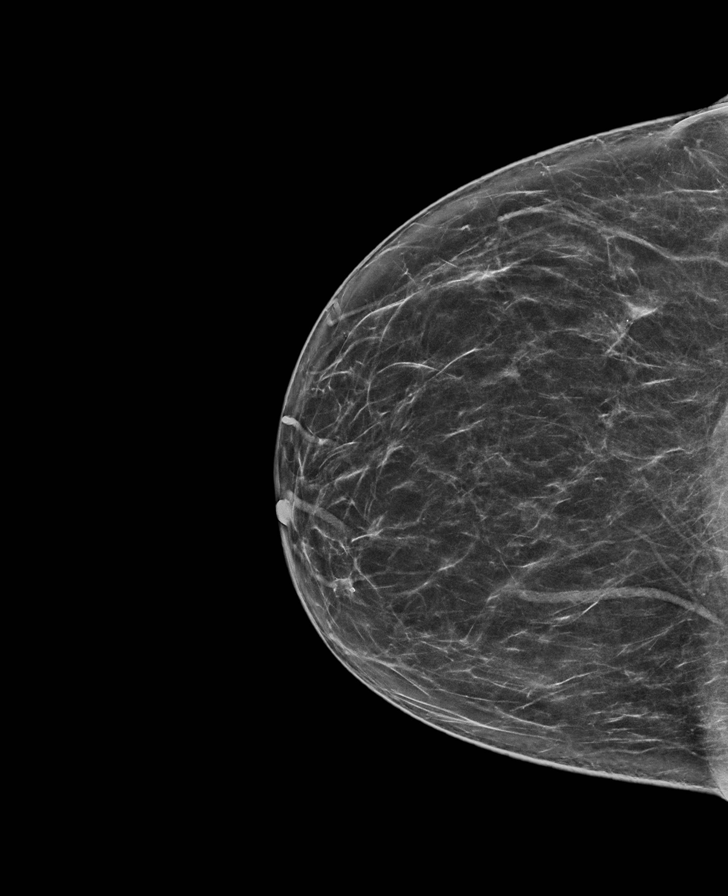

[L MLO synth-2D]
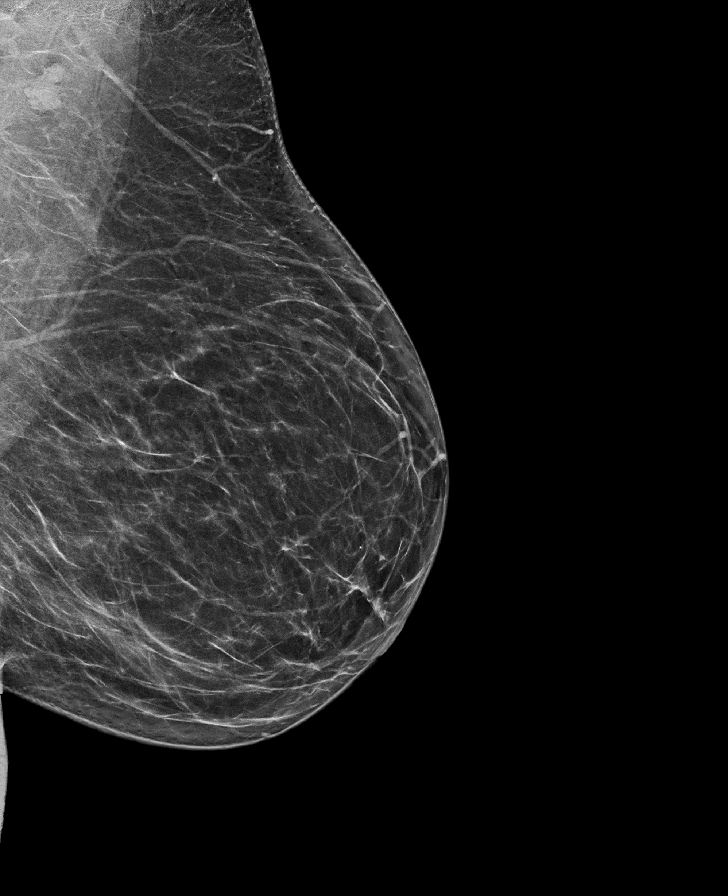

[R MLO synth-2D]
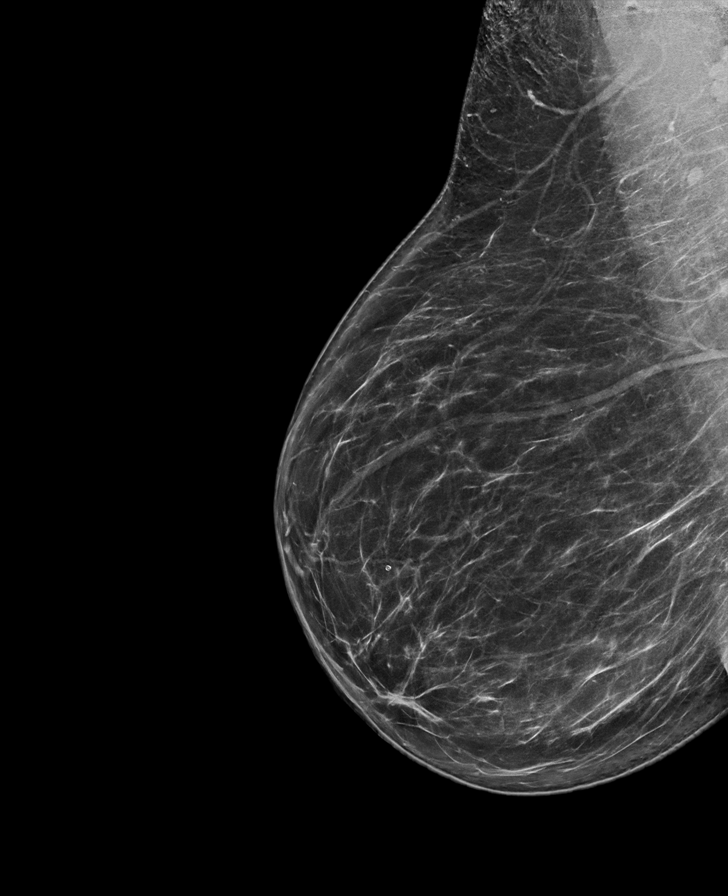

[L CC synth-2D]
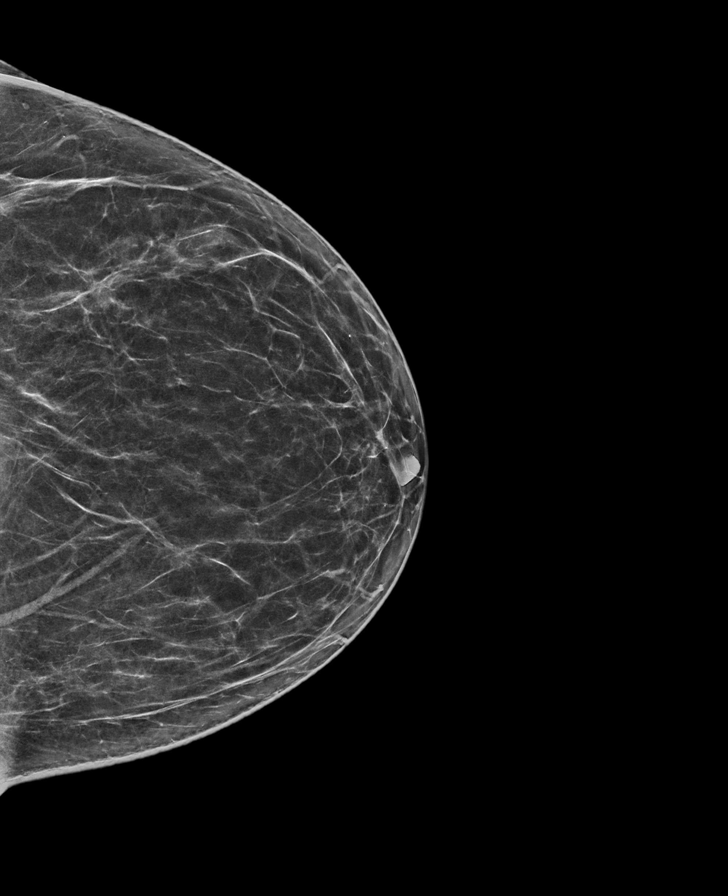

[L CC tomo · tomo slice 35/70.0]
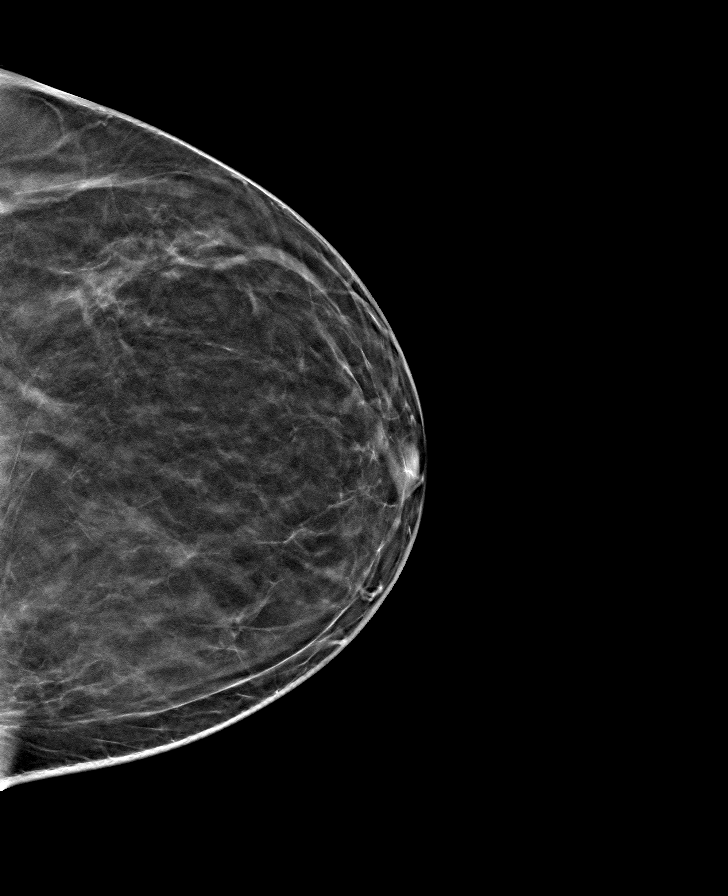

[R MLO tomo · tomo slice 41/80.0]
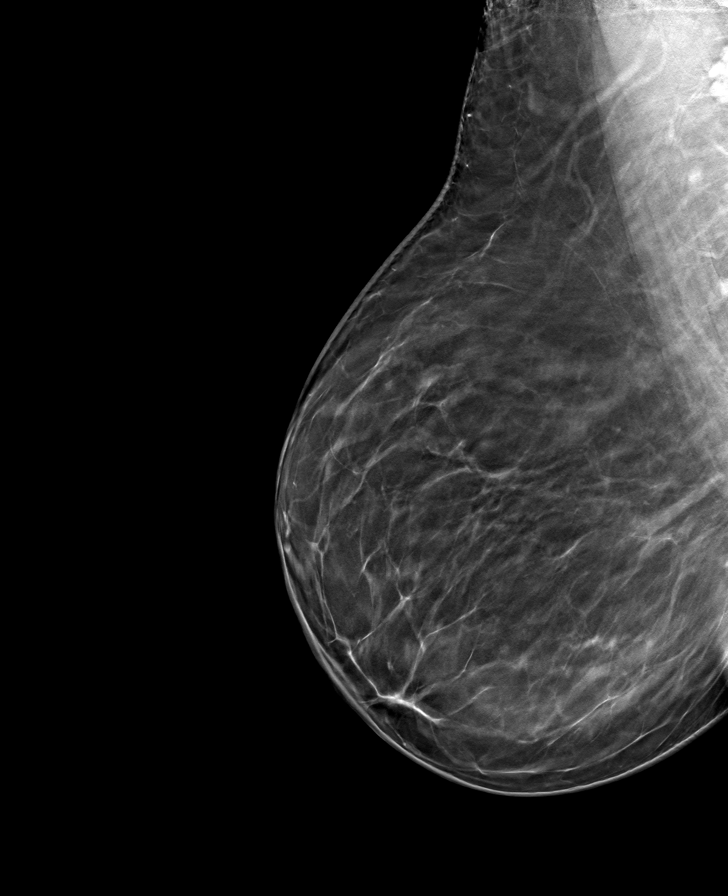

[R CC tomo · tomo slice 37/73.0]
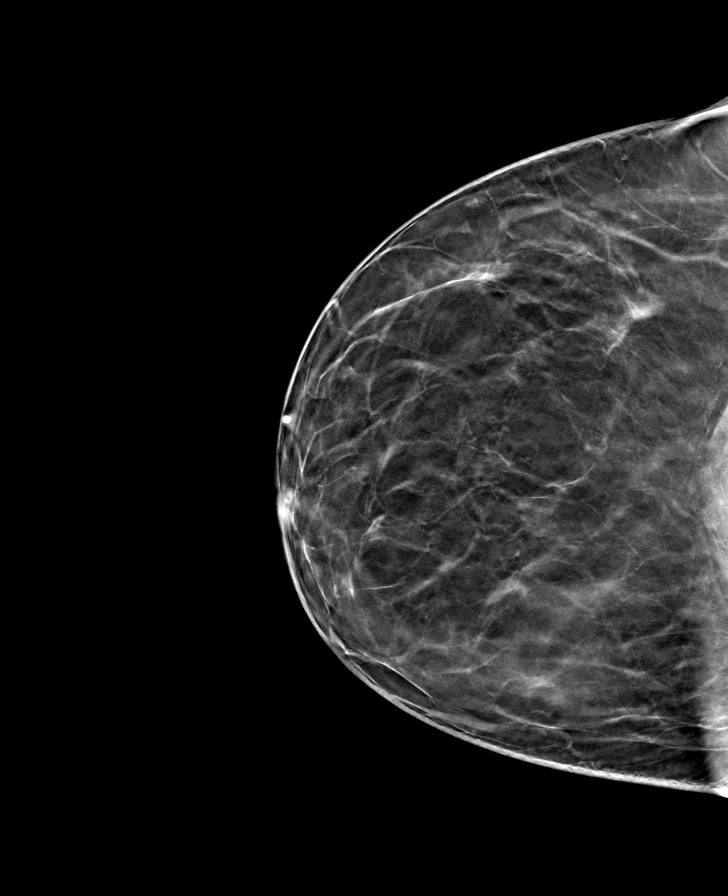

[L MLO tomo · tomo slice 38/75.0]
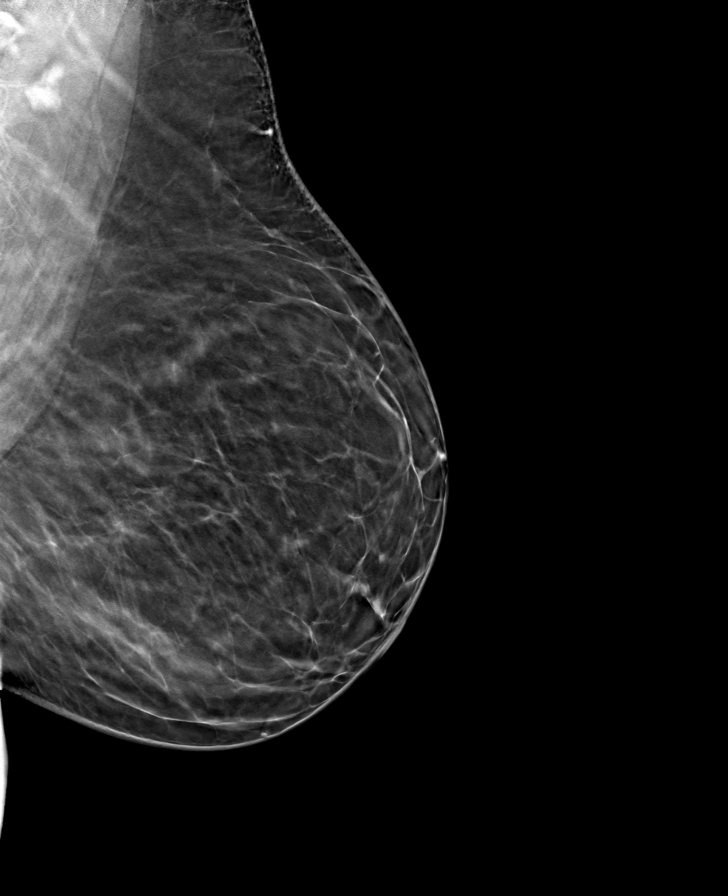

[8 of 24 positions shown; findings below may reference images not displayed]

ACR Breast Density Category b: There are scattered areas of
fibroglandular density.
FINDINGS: There are no findings suspicious for malignancy.
IMPRESSION: No mammographic evidence of malignancy. A result letter of this
screening mammogram will be mailed directly to the patient.

RECOMMENDATION:
Screening mammogram in one year. (Code:51-O-LD2)

BI-RADS CATEGORY  1: Negative.

## 2022-04-01 ENCOUNTER — Telehealth: Payer: Self-pay | Admitting: *Deleted

## 2022-04-01 NOTE — Telephone Encounter (Signed)
Pt called and stated that her insurance is denying her claim due to the coding being incorrect. This is the bill from 08/2021. She stated that this is 2nd time that this has happened. She reports that she spoke to Deirdre about this and it was taken care of.   I will fwd this to Deirdre to look into for her.   *email sent

## 2022-06-14 ENCOUNTER — Ambulatory Visit (INDEPENDENT_AMBULATORY_CARE_PROVIDER_SITE_OTHER): Payer: BC Managed Care – PPO

## 2022-06-14 ENCOUNTER — Encounter: Payer: Self-pay | Admitting: Family Medicine

## 2022-06-14 ENCOUNTER — Ambulatory Visit: Payer: BC Managed Care – PPO | Admitting: Family Medicine

## 2022-06-14 VITALS — BP 120/70 | HR 73 | Ht 62.0 in | Wt 214.0 lb

## 2022-06-14 DIAGNOSIS — M25572 Pain in left ankle and joints of left foot: Secondary | ICD-10-CM | POA: Diagnosis not present

## 2022-06-14 DIAGNOSIS — Z23 Encounter for immunization: Secondary | ICD-10-CM

## 2022-06-14 NOTE — Progress Notes (Signed)
   Acute Office Visit  Subjective:     Patient ID: Stephanie [name], female    DOB: [birth year], 66 y.o.   MRN: [id number]  Chief Complaint  Patient presents with   Foot Pain    HPI Patient is in today for left foot pain.  She says she stepped into a hole and jarred that ankle about a month ago.  Since then it has been tender especially with prolonged walking.  She also has discomfort and pain when there is any compression such as wearing a taller shoe or tennis shoe.  So in fact today she has sandals on.  She has had a lot of swelling around the lateral malleolus which is where her pain and discomfort is.  She has been able to walk on it.  Interestingly in the past she had a tendinitis at the abductor digit he minimi causing pain at the fifth metatarsal base back in September 2021.  Did try wearing a compression sleeve but it was not really helpful.  Icing helps temporarily.  ROS      Objective:    BP 120/70 (BP Location: Left Arm, Patient Position: Sitting, Cuff Size: Large)   Pulse 73   Ht '5\' 2"'$  (1.575 m)   Wt 214 lb (97.1 kg)   SpO2 100%   BMI 39.14 kg/m    Physical Exam Musculoskeletal:     Comments: Ankle with normal range of motion.  She has significant swelling over the lateral malleolus she is tender along the posterior edge and medial edge but not superiorly or inferiorly.  No increased laxity with anterior drawer.  Strength is 5 out of 5.     No results found for any visits on 06/14/22.      Assessment & Plan:   Problem List Items Addressed This Visit   None Visit Diagnoses     Need for influenza vaccination    -  Primary   Relevant Orders   Flu Vaccine QUAD 6+ mos PF IM (Fluarix Quad PF) (Completed)   Acute left ankle pain       Relevant Orders   DG Ankle Complete Left      Imaging to rule out lateral malleolus fracture.  Consider peroneal tendinitis as well though her pain is definitely posterior.  Patient went for x-ray upon my review I do not see any  concern for fracture at this point.  We discussed icing, anti-inflammatory, elevation and giving her a little bit more support than just the compression sleeve since she did not feel that was helpful.  ASO provided and fitted.  Patient felt good support before leaving.  Call or return if not improving over the next couple of weeks.  No orders of the defined types were placed in this encounter.   No follow-ups on file.  Beatrice Lecher, MD

## 2022-06-14 NOTE — Patient Instructions (Signed)
Recommend wearing the support brace during the day for the next 1 to 2 weeks.   Work on gentle range of motion just shown stretches and circles with your ankles in both directions to help rehab and stretch the muscles. Continue to ice as needed. Also okay to use ibuprofen or Aleve for pain relief. If not improving over the next 2 weeks with extra support then please let us know.

## 2022-06-17 NOTE — Progress Notes (Signed)
Stephanie [name], they radiologist.said "possible" a tiny avulsion fracture at the tip of the bone. If the laceup brace it helping we can stick with that. I would like to get you with our sports med doc to evaluate if we need ot do anything further

## 2022-06-21 ENCOUNTER — Ambulatory Visit: Payer: BC Managed Care – PPO | Admitting: Sports Medicine

## 2022-06-21 DIAGNOSIS — S8262XA Displaced fracture of lateral malleolus of left fibula, initial encounter for closed fracture: Secondary | ICD-10-CM | POA: Diagnosis not present

## 2022-06-21 MED ORDER — TRAMADOL HCL 50 MG PO TABS: 50.0000 mg | ORAL_TABLET | Freq: Three times a day (TID) | ORAL | 0 refills | Status: DC | PRN

## 2022-06-21 NOTE — Progress Notes (Signed)
    Procedures performed today:    X-rays personally reviewed, I do see a tiny avulsion from the tip of the lateral malleolus, there is also a tiny avulsion from the dorsal talus  Independent interpretation of notes and tests performed by another provider:   None.  Brief History, Exam, Impression, and Recommendations:    Avulsion fracture of lateral malleolus of left fibula Stephanie [name] is a pleasant 66 year old female, she stepped in a hole about a month ago, inverted [name] left foot, she had some pain and swelling, x-ray showed a tiny avulsion lateral malleolus, she also had evidence of an avulsion from the dorsal talus but is asymptomatic here. [name] exam is for the most part benign with the exception of tenderness at the tip of the lateral malleolus, ASO is causing some pain so she will transition into a boot which she already has at home, she will do this for 2 weeks and then transition back into the ASO, home conditioning given, adding tramadol for pain, she will do a calcium and vitamin D supplement twice daily, return to see me in a month, patient understands that 8 weeks is not uncommon for healing, and sometimes 20% longer in the elderly.    ____________________________________________ Stephanie [name]. Dianah Field, M.D., ABFM., CAQSM., AME. Primary Care and Sports Medicine [facility]  Adjunct Professor of [place] [employer] [facility] of Medicine  Risk manager

## 2022-06-21 NOTE — Assessment & Plan Note (Signed)
Stephanie [name] is a pleasant 66 year old female, she stepped in a hole about a month ago, inverted her left foot, she had some pain and swelling, x-ray showed a tiny avulsion lateral malleolus, she also had evidence of an avulsion from the dorsal talus but is asymptomatic here. Her exam is for the most part benign with the exception of tenderness at the tip of the lateral malleolus, ASO is causing some pain so she will transition into a boot which she already has at home, she will do this for 2 weeks and then transition back into the ASO, home conditioning given, adding tramadol for pain, she will do a calcium and vitamin D supplement twice daily, return to see me in a month, patient understands that 8 weeks is not uncommon for healing, and sometimes 20% longer in the elderly.

## 2022-07-19 ENCOUNTER — Ambulatory Visit: Payer: BC Managed Care – PPO | Admitting: Sports Medicine

## 2022-07-19 ENCOUNTER — Encounter: Payer: Self-pay | Admitting: Sports Medicine

## 2022-07-19 DIAGNOSIS — S8262XD Displaced fracture of lateral malleolus of left fibula, subsequent encounter for closed fracture with routine healing: Secondary | ICD-10-CM | POA: Diagnosis not present

## 2022-07-19 NOTE — Progress Notes (Signed)
    Procedures performed today:    None.  Independent interpretation of notes and tests performed by another provider:   None.  Brief History, Exam, Impression, and Recommendations:    Avulsion fracture of lateral malleolus of left fibula Stephanie [name] returns, she is a pleasant 66 year old female, approximately 2 months ago she stepped in a hole, inverted her left foot, had some pain, swelling, did not really get treated, x-rays did show a tiny avulsion from the lateral malleolus and evidence of an avulsion from the dorsal talus. On clinical exam at the last visit she was asymptomatic over the dorsal talus but she did have tenderness at the tip of the malleolus laterally. We placed her in a boot for 2 weeks followed by an ASO, tramadol for pain, she does have some NSAIDs as well. She returns today doing better but continues to have significant discomfort at the tip of the fibula. We have to remember that we have only been treating her for about a month now. At this point I would like her to continue the ASO and work aggressively with her home physical therapy. Return to see me in about 6 weeks.  Morbid obesity ([place]) Stephanie [name] does understand the importance of treating her obesity, it results on a multitude of problems including delaying her for her healing. I would like her to discuss weight loss medication including GLP-1's with her PCP.    ____________________________________________ Gwen Her. Dianah Field, M.D., ABFM., CAQSM., AME. Primary Care and Sports Medicine [facility]  Adjunct Professor of [place] of [facility] of Medicine  Risk manager

## 2022-07-19 NOTE — Assessment & Plan Note (Signed)
Stephanie [name] returns, she is a pleasant 66 year old female, approximately 2 months ago she stepped in a hole, inverted her left foot, had some pain, swelling, did not really get treated, x-rays did show a tiny avulsion from the lateral malleolus and evidence of an avulsion from the dorsal talus. On clinical exam at the last visit she was asymptomatic over the dorsal talus but she did have tenderness at the tip of the malleolus laterally. We placed her in a boot for 2 weeks followed by an ASO, tramadol for pain, she does have some NSAIDs as well. She returns today doing better but continues to have significant discomfort at the tip of the fibula. We have to remember that we have only been treating her for about a month now. At this point I would like her to continue the ASO and work aggressively with her home physical therapy. Return to see me in about 6 weeks.

## 2022-07-19 NOTE — Assessment & Plan Note (Signed)
Nhi does understand the importance of treating her obesity, it results on a multitude of problems including delaying her for her healing. I would like her to discuss weight loss medication including GLP-1's with her PCP.

## 2022-07-30 ENCOUNTER — Other Ambulatory Visit (HOSPITAL_BASED_OUTPATIENT_CLINIC_OR_DEPARTMENT_OTHER): Payer: Self-pay | Admitting: Family Medicine

## 2022-07-30 DIAGNOSIS — Z1231 Encounter for screening mammogram for malignant neoplasm of breast: Secondary | ICD-10-CM

## 2022-08-02 ENCOUNTER — Other Ambulatory Visit (HOSPITAL_BASED_OUTPATIENT_CLINIC_OR_DEPARTMENT_OTHER): Payer: Self-pay | Admitting: Family Medicine

## 2022-08-02 ENCOUNTER — Inpatient Hospital Stay (HOSPITAL_BASED_OUTPATIENT_CLINIC_OR_DEPARTMENT_OTHER): Admission: RE | Admit: 2022-08-02 | Payer: BC Managed Care – PPO | Source: Ambulatory Visit | Admitting: Radiology

## 2022-08-02 DIAGNOSIS — Z1231 Encounter for screening mammogram for malignant neoplasm of breast: Secondary | ICD-10-CM

## 2022-08-05 ENCOUNTER — Ambulatory Visit (HOSPITAL_BASED_OUTPATIENT_CLINIC_OR_DEPARTMENT_OTHER)
Admission: RE | Admit: 2022-08-05 | Discharge: 2022-08-05 | Disposition: A | Discharge status: Home / Self Care | Payer: BC Managed Care – PPO | Source: Ambulatory Visit | Attending: Nurse Practitioner | Admitting: Nurse Practitioner

## 2022-08-05 DIAGNOSIS — Z1231 Encounter for screening mammogram for malignant neoplasm of breast: Secondary | ICD-10-CM | POA: Insufficient documentation

## 2022-08-06 NOTE — Progress Notes (Signed)
Please call patient. Normal mammogram.  Repeat in 1 year.  

## 2022-08-28 ENCOUNTER — Ambulatory Visit (INDEPENDENT_AMBULATORY_CARE_PROVIDER_SITE_OTHER): Payer: Medicare Other | Admitting: Sports Medicine

## 2022-08-28 DIAGNOSIS — M7661 Achilles tendinitis, right leg: Secondary | ICD-10-CM | POA: Diagnosis not present

## 2022-08-28 DIAGNOSIS — S8262XD Displaced fracture of lateral malleolus of left fibula, subsequent encounter for closed fracture with routine healing: Secondary | ICD-10-CM | POA: Diagnosis not present

## 2022-08-28 NOTE — Patient Instructions (Signed)
Begin with easy walking, heel, toe and backwards  Do calf raises on a step:  First lower and then raise on 1 foot  If this is painful, lower on 1 foot, do the heel raise on both feet Begin with 3 sets of 10 repetitions  Increase by 5 repetitions every 3 days  Goal is 3 sets of 30 repetitions  Do with both straight knee and knee at 20 degrees of flexion  If pain persists, once you can do 3 sets of 30 without weight, add backpack with 5 lbs.  Increase by 5 lbs per week to max of 30 lbs for 3 sets of 15   

## 2022-08-28 NOTE — Assessment & Plan Note (Signed)
This is a very pleasant 66 year old female, she is now approximately 3-1/2 months post inversion injury left foot with x-rays showing what appeared to be a dorsal talar avulsion fracture and a tiny avulsion from the tip of the fibula. She is not having significant pain over the dorsal talus but does have a bit of pain tip of the fibula. She also had significant degenerative findings on her x-rays. At this point the fracture should be healed, so we will proceed with an MRI of her ankle for further evaluation.  Her pain did feel to be predominantly just past the anterior process of the calcaneus.

## 2022-08-28 NOTE — Assessment & Plan Note (Signed)
Stephanie [name] also has pain at the Achilles insertion right side, she does have a history of left-sided insertional Achilles tendinosis treated in 2016 successfully with eccentric conditioning and nitro patches. She complained of severe headaches after a steroid injection so we will hold off on nitro patches for now, I have added heel lift and she will do eccentric conditioning for the next 6 weeks. She does tell me she had a PRP type procedure with a local podiatrist, and I have advised her that if she had a good response with him she should follow-up with him should she require procedural intervention.

## 2022-08-28 NOTE — Progress Notes (Signed)
    Procedures performed today:    None.  Independent interpretation of notes and tests performed by another provider:   I did personally review the x-rays, there is significant insertional calcifications at the Achilles insertion and plantar fascial origin.  Brief History, Exam, Impression, and Recommendations:    Avulsion fracture of left dorsal talus and potentially left fibular tip This is a very pleasant 65 year old female, she is now approximately 3-1/2 months post inversion injury left foot with x-rays showing what appeared to be a dorsal talar avulsion fracture and a tiny avulsion from the tip of the fibula. She is not having significant pain over the dorsal talus but does have a bit of pain tip of the fibula. She also had significant degenerative findings on her x-rays. At this point the fracture should be healed, so we will proceed with an MRI of her ankle for further evaluation.  Her pain did feel to be predominantly just past the anterior process of the calcaneus.  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 She complained of severe headaches after a steroid injection so we will hold off on nitro patches for now, I have added heel lift and she will do eccentric conditioning for the next 6 weeks. She does tell me she had a PRP type procedure with a local podiatrist, and I have advised her that if she had a good response with him she should follow-up with him should she require procedural intervention.  I spent 30 minutes of total time managing this patient today, this includes chart review, face to face, and non-face to face time.  ____________________________________________ Gwen Her. Dianah Field, M.D., ABFM., CAQSM., AME. Primary Care and Sports Medicine [facility]  [facility]  Adjunct Professor of [place] of [facility] of Medicine  Risk manager

## 2022-08-29 ENCOUNTER — Ambulatory Visit: Payer: BC Managed Care – PPO | Admitting: Sports Medicine

## 2022-09-07 ENCOUNTER — Other Ambulatory Visit: Payer: Medicare Other

## 2022-09-09 ENCOUNTER — Ambulatory Visit (INDEPENDENT_AMBULATORY_CARE_PROVIDER_SITE_OTHER): Payer: Medicare Other

## 2022-09-09 DIAGNOSIS — S8262XA Displaced fracture of lateral malleolus of left fibula, initial encounter for closed fracture: Secondary | ICD-10-CM | POA: Diagnosis not present

## 2022-09-09 DIAGNOSIS — S93492A Sprain of other ligament of left ankle, initial encounter: Secondary | ICD-10-CM | POA: Diagnosis not present

## 2022-09-09 DIAGNOSIS — M25472 Effusion, left ankle: Secondary | ICD-10-CM | POA: Diagnosis not present

## 2022-09-09 DIAGNOSIS — S8262XD Displaced fracture of lateral malleolus of left fibula, subsequent encounter for closed fracture with routine healing: Secondary | ICD-10-CM

## 2022-09-09 DIAGNOSIS — M19072 Primary osteoarthritis, left ankle and foot: Secondary | ICD-10-CM | POA: Diagnosis not present

## 2022-09-09 DIAGNOSIS — M65872 Other synovitis and tenosynovitis, left ankle and foot: Secondary | ICD-10-CM | POA: Diagnosis not present

## 2022-09-11 ENCOUNTER — Other Ambulatory Visit: Payer: Self-pay | Admitting: Sports Medicine

## 2022-09-11 MED ORDER — MELOXICAM 15 MG PO TABS: ORAL_TABLET | ORAL | 3 refills | Status: DC

## 2022-12-20 ENCOUNTER — Ambulatory Visit (INDEPENDENT_AMBULATORY_CARE_PROVIDER_SITE_OTHER): Payer: Medicare Other

## 2022-12-20 ENCOUNTER — Ambulatory Visit (INDEPENDENT_AMBULATORY_CARE_PROVIDER_SITE_OTHER): Payer: Medicare Other | Admitting: Sports Medicine

## 2022-12-20 DIAGNOSIS — M7661 Achilles tendinitis, right leg: Secondary | ICD-10-CM

## 2022-12-20 DIAGNOSIS — L84 Corns and callosities: Secondary | ICD-10-CM | POA: Diagnosis not present

## 2022-12-20 MED ORDER — TRIAMCINOLONE ACETONIDE 40 MG/ML IJ SUSP
40.0000 mg | Freq: Once | INTRAMUSCULAR | Status: AC
  Administered 2022-12-20: 40 mg via INTRAMUSCULAR

## 2022-12-20 MED ORDER — NITROGLYCERIN 0.2 MG/HR TD PT24: MEDICATED_PATCH | TRANSDERMAL | 11 refills | Status: DC

## 2022-12-20 NOTE — Progress Notes (Signed)
    Procedures performed today:    Procedure: Real-time Ultrasound Guided injection of the right retrocalcaneal bursa Device: Samsung HS60  Verbal informed consent obtained.  Time-out conducted.  Noted no overlying erythema, induration, or other signs of local infection.  Skin prepped in a sterile fashion.  Local anesthesia: Topical Ethyl chloride.  With sterile technique and under real time ultrasound guidance: Noted mild retrocalcaneal bursitis and significant intra Achilles calcifications and tendinosis, 1/2 cc lidocaine, 1/2 cc kenalog 40 injected easily. Completed without difficulty  Advised to call if fevers/chills, erythema, induration, drainage, or persistent bleeding.  Images permanently stored and available for review in PACS.  Impression: Technically successful ultrasound guided injection.  Independent interpretation of notes and tests performed by another provider:   None.  Brief History, Exam, Impression, and Recommendations:    Achilles tendinitis, right leg This is a very pleasant 67 year old female, she is chronic pain at the right Achilles insertion, she also has a palpable Haglund's deformity. 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 We added additional home conditioning on the right side with a heel lift, we did not do nitro patches due to a headache after steroid injection. She also had a PRP type procedure with a local podiatrist in the past. As she continues to have pain several months after diagnosis and spite of heel lifts, eccentric conditioning we will do a retrocalcaneal bursa injection today, she will immediately go into boot immobilization at home for at least a week to protect her Achilles, I would also like to add Nitro patches. Return to see me in about a month.   Corn of left foot Painful visible and palpable corn left foot plantar fifth MTP. She will  do a corn pad for unloading over the next month or 2, if persistence we will refer downstairs to podiatry for consideration of paring down or coring out the corn.    ____________________________________________ Gwen Her. Dianah Field, M.D., ABFM., CAQSM., AME. Primary Care and Sports Medicine [facility]  Adjunct Professor of [employer] [facility] [employer]  Risk manager

## 2022-12-20 NOTE — Assessment & Plan Note (Addendum)
This is a very pleasant 67 year old female, she is chronic pain at the right Achilles insertion, she also has a palpable Haglund's deformity. 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 We added additional home conditioning on the right side with a heel lift, we did not do nitro patches due to a headache after steroid injection. She also had a PRP type procedure with a local podiatrist in the past. As she continues to have pain several months after diagnosis and spite of heel lifts, eccentric conditioning we will do a retrocalcaneal bursa injection today, she will immediately go into boot immobilization at home for at least a week to protect her Achilles, I would also like to add Nitro patches. Return to see me in about a month.

## 2022-12-20 NOTE — Assessment & Plan Note (Signed)
Painful visible and palpable corn left foot plantar fifth MTP. She will do a corn pad for unloading over the next month or 2, if persistence we will refer downstairs to podiatry for consideration of paring down or coring out the corn.

## 2023-01-17 ENCOUNTER — Ambulatory Visit (INDEPENDENT_AMBULATORY_CARE_PROVIDER_SITE_OTHER): Payer: Medicare Other | Admitting: Sports Medicine

## 2023-01-17 DIAGNOSIS — M7661 Achilles tendinitis, right leg: Secondary | ICD-10-CM

## 2023-01-17 NOTE — Assessment & Plan Note (Signed)
Pleasant 67 year old female returns, chronic pain right Achilles insertion with a palpable Haglund's deformity. This was treated successfully back in 2016 with eccentric conditioning and nitroglycerin patches, we had tried conservative treatment again, she did not respond, on further questioning she has been intermittent with her nitroglycerin patches. She also had a PRP type procedure with a local podiatrist in the past, after several months of heel lifts and eccentric conditioning we did a retrocalcaneal bursa injection at the last visit with boot immobilization for a week to protect the Achilles. She returns today about 85% better. As she has only been intermittent with the Nitropatch as she will get more consistent wearing than 24 hours a day for 2 months. Would like to see her back in 2 months to see how things are going, if insufficient improvement we will proceed with MRI and referral to foot surgery.

## 2023-01-17 NOTE — Progress Notes (Signed)
    Procedures performed today:    None.  Independent interpretation of notes and tests performed by another provider:   None.  Brief History, Exam, Impression, and Recommendations:    Achilles tendinitis, right leg Stephanie [name] 67 year old female returns, chronic pain right Achilles insertion with a palpable Haglund's deformity. This was treated successfully back in 2016 with eccentric conditioning and nitroglycerin patches, we had tried conservative treatment again, she did not respond, on further questioning she has been intermittent with her nitroglycerin patches. She also had a PRP type procedure with a local podiatrist in the past, after several months of heel lifts and eccentric conditioning we did a retrocalcaneal bursa injection at the last visit with boot immobilization for a week to protect the Achilles. She returns today about 85% better. As she has only been intermittent with the Nitropatch as she will get more consistent wearing than 24 hours a day for 2 months. Would like to see her back in 2 months to see how things are going, if insufficient improvement we will proceed with MRI and referral to foot surgery.    ____________________________________________ Ihor Austin. Benjamin Stain, M.D., ABFM., CAQSM., AME. Primary Care and Sports Medicine [facility]  Adjunct Professor of Family Medicine  [employer] [facility] [employer]  Restaurant manager, fast food

## 2023-03-14 ENCOUNTER — Ambulatory Visit (INDEPENDENT_AMBULATORY_CARE_PROVIDER_SITE_OTHER): Payer: Medicare Other | Admitting: Sports Medicine

## 2023-03-14 DIAGNOSIS — M7661 Achilles tendinitis, right leg: Secondary | ICD-10-CM | POA: Diagnosis not present

## 2023-03-14 NOTE — Progress Notes (Signed)
    Procedures performed today:    None.  Independent interpretation of notes and tests performed by another provider:   None.  Brief History, Exam, Impression, and Recommendations:    Achilles tendinitis, right leg Stephanie [name] returns, she is a pleasant 67 year old female, chronic right insertional Achilles pain with a palpable Haglund's deformity. This was treated successfully back in 2016 with eccentric conditioning and nitroglycerin patches. We again tried conservative treatment and she did not respond, she has not been consistent with physical therapy. We ended up doing a retrocalcaneal bursa injection followed by boot immobilization, she was about 85% better but then had a recurrence of discomfort. I explained the importance of aggressive eccentric therapy, she is agreeable to proceed, she will restart her nitroglycerin patches. I would also like an MRI to ensure that we are not missing a diagnosis here.  I spent 40 minutes of total time managing this patient today, this includes chart review, face to face, and non-face to face time.  ____________________________________________ Ihor Austin. Benjamin Stain, M.D., ABFM., CAQSM., AME. Primary Care and Sports Medicine [facility]  Adjunct Professor of Family Medicine  [employer] [facility] [employer]  Restaurant manager, fast food

## 2023-03-14 NOTE — Assessment & Plan Note (Signed)
Stephanie [name] returns, she is a pleasant 67 year old female, chronic right insertional Achilles pain with a palpable Haglund's deformity. This was treated successfully back in 2016 with eccentric conditioning and nitroglycerin patches. We again tried conservative treatment and she did not respond, she has not been consistent with physical therapy. We ended up doing a retrocalcaneal bursa injection followed by boot immobilization, she was about 85% better but then had a recurrence of discomfort. I explained the importance of aggressive eccentric therapy, she is agreeable to proceed, she will restart her nitroglycerin patches. I would also like an MRI to ensure that we are not missing a diagnosis here.

## 2023-03-22 ENCOUNTER — Other Ambulatory Visit: Payer: Medicare Other

## 2023-03-29 ENCOUNTER — Ambulatory Visit (INDEPENDENT_AMBULATORY_CARE_PROVIDER_SITE_OTHER): Payer: Medicare Other

## 2023-03-29 DIAGNOSIS — M7661 Achilles tendinitis, right leg: Secondary | ICD-10-CM | POA: Diagnosis not present

## 2023-03-29 DIAGNOSIS — M7660 Achilles tendinitis, unspecified leg: Secondary | ICD-10-CM | POA: Diagnosis not present

## 2023-04-03 DIAGNOSIS — M25572 Pain in left ankle and joints of left foot: Secondary | ICD-10-CM | POA: Diagnosis not present

## 2023-04-03 DIAGNOSIS — M7661 Achilles tendinitis, right leg: Secondary | ICD-10-CM | POA: Diagnosis not present

## 2023-04-03 DIAGNOSIS — G8929 Other chronic pain: Secondary | ICD-10-CM | POA: Diagnosis not present

## 2023-04-03 DIAGNOSIS — M25571 Pain in right ankle and joints of right foot: Secondary | ICD-10-CM | POA: Diagnosis not present

## 2023-04-03 DIAGNOSIS — M25671 Stiffness of right ankle, not elsewhere classified: Secondary | ICD-10-CM | POA: Diagnosis not present

## 2023-04-03 DIAGNOSIS — Z7409 Other reduced mobility: Secondary | ICD-10-CM | POA: Diagnosis not present

## 2023-04-10 DIAGNOSIS — Z7409 Other reduced mobility: Secondary | ICD-10-CM | POA: Diagnosis not present

## 2023-04-10 DIAGNOSIS — M7661 Achilles tendinitis, right leg: Secondary | ICD-10-CM | POA: Diagnosis not present

## 2023-04-10 DIAGNOSIS — M25572 Pain in left ankle and joints of left foot: Secondary | ICD-10-CM | POA: Diagnosis not present

## 2023-04-10 DIAGNOSIS — G8929 Other chronic pain: Secondary | ICD-10-CM | POA: Diagnosis not present

## 2023-04-10 DIAGNOSIS — M25571 Pain in right ankle and joints of right foot: Secondary | ICD-10-CM | POA: Diagnosis not present

## 2023-04-10 DIAGNOSIS — M25671 Stiffness of right ankle, not elsewhere classified: Secondary | ICD-10-CM | POA: Diagnosis not present

## 2023-04-15 DIAGNOSIS — M25571 Pain in right ankle and joints of right foot: Secondary | ICD-10-CM | POA: Diagnosis not present

## 2023-04-15 DIAGNOSIS — G8929 Other chronic pain: Secondary | ICD-10-CM | POA: Diagnosis not present

## 2023-04-15 DIAGNOSIS — Z7409 Other reduced mobility: Secondary | ICD-10-CM | POA: Diagnosis not present

## 2023-04-15 DIAGNOSIS — M25572 Pain in left ankle and joints of left foot: Secondary | ICD-10-CM | POA: Diagnosis not present

## 2023-04-15 DIAGNOSIS — M25671 Stiffness of right ankle, not elsewhere classified: Secondary | ICD-10-CM | POA: Diagnosis not present

## 2023-04-15 DIAGNOSIS — M7661 Achilles tendinitis, right leg: Secondary | ICD-10-CM | POA: Diagnosis not present

## 2023-04-22 DIAGNOSIS — G8929 Other chronic pain: Secondary | ICD-10-CM | POA: Diagnosis not present

## 2023-04-22 DIAGNOSIS — M25671 Stiffness of right ankle, not elsewhere classified: Secondary | ICD-10-CM | POA: Diagnosis not present

## 2023-04-22 DIAGNOSIS — M25571 Pain in right ankle and joints of right foot: Secondary | ICD-10-CM | POA: Diagnosis not present

## 2023-04-22 DIAGNOSIS — M7661 Achilles tendinitis, right leg: Secondary | ICD-10-CM | POA: Diagnosis not present

## 2023-04-22 DIAGNOSIS — M25572 Pain in left ankle and joints of left foot: Secondary | ICD-10-CM | POA: Diagnosis not present

## 2023-04-22 DIAGNOSIS — Z7409 Other reduced mobility: Secondary | ICD-10-CM | POA: Diagnosis not present

## 2023-04-24 DIAGNOSIS — M7661 Achilles tendinitis, right leg: Secondary | ICD-10-CM | POA: Diagnosis not present

## 2023-04-24 DIAGNOSIS — Z7409 Other reduced mobility: Secondary | ICD-10-CM | POA: Diagnosis not present

## 2023-04-24 DIAGNOSIS — G8929 Other chronic pain: Secondary | ICD-10-CM | POA: Diagnosis not present

## 2023-04-24 DIAGNOSIS — M25572 Pain in left ankle and joints of left foot: Secondary | ICD-10-CM | POA: Diagnosis not present

## 2023-04-24 DIAGNOSIS — M25571 Pain in right ankle and joints of right foot: Secondary | ICD-10-CM | POA: Diagnosis not present

## 2023-04-24 DIAGNOSIS — M25671 Stiffness of right ankle, not elsewhere classified: Secondary | ICD-10-CM | POA: Diagnosis not present

## 2023-04-25 ENCOUNTER — Ambulatory Visit (INDEPENDENT_AMBULATORY_CARE_PROVIDER_SITE_OTHER): Payer: Medicare Other | Admitting: Sports Medicine

## 2023-04-25 DIAGNOSIS — R739 Hyperglycemia, unspecified: Secondary | ICD-10-CM | POA: Diagnosis not present

## 2023-04-25 DIAGNOSIS — M2550 Pain in unspecified joint: Secondary | ICD-10-CM | POA: Insufficient documentation

## 2023-04-25 DIAGNOSIS — M7661 Achilles tendinitis, right leg: Secondary | ICD-10-CM | POA: Diagnosis not present

## 2023-04-25 DIAGNOSIS — E785 Hyperlipidemia, unspecified: Secondary | ICD-10-CM | POA: Diagnosis not present

## 2023-04-25 NOTE — Progress Notes (Signed)
    Procedures performed today:    None.  Independent interpretation of notes and tests performed by another provider:   None.  Brief History, Exam, Impression, and Recommendations:    Insertional Achilles tendinosis, right leg Stephanie [name] returns, she is a very pleasant 67 year old female, she has chronic right insertional Achilles pain, palpable Haglund's deformity, we treated this successfully back in 2016 with eccentric conditioning and nitroglycerin patches. More recently she had pain returned approximately 3 to 4 months ago, we tried conservative treatment and she did not respond, she ended up getting a retrocalcaneal bursa injection followed by boot immobilization for a week, she got about 85% better and then had a recurrence of discomfort. We restarted aggressive eccentric formal physical therapy, topical nitroglycerin patches, unfortunately she returns today still with no improvement and no progress whatsoever. We did obtain an MRI that confirmed insertional Achilles tendinosis, she also had some posterior subtalar joint effusions. Tenderness continues to be directly over the Achilles insertion. Due to failure of conservative treatment I would like her to touch base with podiatry for discussion of surgical options. I have advised her to wear her boot when bearing weight until then.  Polyarthralgia Widespread aches and pains, I did advise her that the orthopedic processes she is presenting with are due to overload likely related to her weight and weakness. I did advise her that I was happy to pull the trigger for an autoimmune workup to ensure we were not missing anything.  I spent 30 minutes of total time managing this patient today, this includes chart review, face to face, and non-face to face time.  ____________________________________________ Ihor Austin. Stephanie [name], M.D., ABFM., CAQSM., AME. Primary Care and Sports Medicine [facility]  Adjunct Professor  of Family Medicine  [employer] [facility] [employer]  Restaurant manager, fast food

## 2023-04-25 NOTE — Assessment & Plan Note (Signed)
Stephanie [name] returns, she is a very pleasant 67 year old female, she has chronic right insertional Achilles pain, palpable Haglund's deformity, we treated this successfully back in 2016 with eccentric conditioning and nitroglycerin patches. More recently she had pain returned approximately 3 to 4 months ago, we tried conservative treatment and she did not respond, she ended up getting a retrocalcaneal bursa injection followed by boot immobilization for a week, she got about 85% better and then had a recurrence of discomfort. We restarted aggressive eccentric formal physical therapy, topical nitroglycerin patches, unfortunately she returns today still with no improvement and no progress whatsoever. We did obtain an MRI that confirmed insertional Achilles tendinosis, she also had some posterior subtalar joint effusions. Tenderness continues to be directly over the Achilles insertion. Due to failure of conservative treatment I would like her to touch base with podiatry for discussion of surgical options. I have advised her to wear her boot when bearing weight until then.

## 2023-04-25 NOTE — Assessment & Plan Note (Signed)
Widespread aches and pains, I did advise her that the orthopedic processes she is presenting with are due to overload likely related to her weight and weakness. I did advise her that I was happy to pull the trigger for an autoimmune workup to ensure we were not missing anything.

## 2023-05-05 LAB — LUPUS(12) PANEL
Anti Nuclear Antibody (ANA): NEGATIVE
C3 Complement: 177 mg/dL (ref 83–193)
C4 Complement: 35 mg/dL (ref 15–57)
ENA SM Ab Ser-aCnc: <1 AI
Rheumatoid fact SerPl-aCnc: 13 IU/mL (ref <14)
Ribosomal P Protein Ab: <1 AI
SM/RNP: <1 AI
SSA (Ro) (ENA) Antibody, IgG: <1 AI
SSB (La) (ENA) Antibody, IgG: <1 AI
Scleroderma (Scl-70) (ENA) Antibody, IgG: <1 AI
Thyroperoxidase Ab SerPl-aCnc: 28 IU/mL — ABNORMAL HIGH (ref <9)
ds DNA Ab: <1 IU/mL

## 2023-05-05 LAB — COMPREHENSIVE METABOLIC PANEL
AG Ratio: 1.8 (calc) (ref 1.0–2.5)
ALT: 16 U/L (ref 6–29)
AST: 15 U/L (ref 10–35)
Albumin: 4.5 g/dL (ref 3.6–5.1)
Alkaline phosphatase (APISO): 147 U/L (ref 37–153)
BUN: 20 mg/dL (ref 7–25)
CO2: 27 mmol/L (ref 20–32)
Calcium: 9.6 mg/dL (ref 8.6–10.4)
Chloride: 107 mmol/L (ref 98–110)
Creat: 0.75 mg/dL (ref 0.50–1.05)
Globulin: 2.5 g/dL (calc) (ref 1.9–3.7)
Glucose, Bld: 96 mg/dL (ref 65–99)
Potassium: 4.7 mmol/L (ref 3.5–5.3)
Sodium: 142 mmol/L (ref 135–146)
Total Bilirubin: 0.6 mg/dL (ref 0.2–1.2)
Total Protein: 7 g/dL (ref 6.1–8.1)

## 2023-05-05 LAB — CBC WITH DIFFERENTIAL/PLATELET
Absolute Monocytes: 348 cells/uL (ref 200–950)
Basophils Absolute: 52 cells/uL (ref 0–200)
Basophils Relative: 1.2 %
Eosinophils Absolute: 77 cells/uL (ref 15–500)
Eosinophils Relative: 1.8 %
HCT: 41.4 % (ref 35.0–45.0)
Hemoglobin: 13.2 g/dL (ref 11.7–15.5)
Lymphs Abs: 1436 cells/uL (ref 850–3900)
MCH: 27.7 pg (ref 27.0–33.0)
MCHC: 31.9 g/dL — ABNORMAL LOW (ref 32.0–36.0)
MCV: 87 fL (ref 80.0–100.0)
MPV: 11.3 fL (ref 7.5–12.5)
Monocytes Relative: 8.1 %
Neutro Abs: 2387 cells/uL (ref 1500–7800)
Neutrophils Relative %: 55.5 %
Platelets: 210 10*3/uL (ref 140–400)
RBC: 4.76 10*6/uL (ref 3.80–5.10)
RDW: 12.8 % (ref 11.0–15.0)
Total Lymphocyte: 33.4 %
WBC: 4.3 10*3/uL (ref 3.8–10.8)

## 2023-05-05 LAB — CYCLIC CITRUL PEPTIDE ANTIBODY, IGG: Cyclic Citrullin Peptide Ab: <16 UNITS

## 2023-05-05 LAB — LIPID PANEL
Cholesterol: 214 mg/dL — ABNORMAL HIGH (ref <200)
HDL: 60 mg/dL (ref >=50)
LDL Cholesterol (Calc): 132 mg/dL (calc) — ABNORMAL HIGH
Non-HDL Cholesterol (Calc): 154 mg/dL (calc) — ABNORMAL HIGH (ref <130)
Total CHOL/HDL Ratio: 3.6 (calc) (ref <5.0)
Triglycerides: 109 mg/dL (ref <150)

## 2023-05-05 LAB — TSH: TSH: 1.31 mIU/L (ref 0.40–4.50)

## 2023-05-05 LAB — RHEUMATOID FACTOR (IGA, IGG, IGM)
Rheumatoid Factor (IgA): <5 U (ref <=6)
Rheumatoid Factor (IgG): <5 U (ref <=6)
Rheumatoid Factor (IgM): <5 U (ref <=6)

## 2023-05-05 LAB — HEMOGLOBIN A1C
Hgb A1c MFr Bld: 5.8 % of total Hgb — ABNORMAL HIGH (ref <5.7)
Mean Plasma Glucose: 120 mg/dL
eAG (mmol/L): 6.6 mmol/L

## 2023-05-05 LAB — URIC ACID: Uric Acid, Serum: 3.8 mg/dL (ref 2.5–7.0)

## 2023-05-05 LAB — SEDIMENTATION RATE: Sed Rate: 25 mm/h (ref 0–30)

## 2023-05-05 LAB — CK: Total CK: 67 U/L (ref 29–143)

## 2023-05-07 ENCOUNTER — Encounter: Payer: Self-pay | Admitting: Family Medicine

## 2023-05-07 ENCOUNTER — Telehealth: Payer: Self-pay | Admitting: Family Medicine

## 2023-05-07 ENCOUNTER — Ambulatory Visit (INDEPENDENT_AMBULATORY_CARE_PROVIDER_SITE_OTHER): Payer: Medicare Other | Admitting: Family Medicine

## 2023-05-07 VITALS — BP 131/58 | HR 76 | Resp 16 | Ht 62.0 in | Wt 222.1 lb

## 2023-05-07 DIAGNOSIS — Z23 Encounter for immunization: Secondary | ICD-10-CM

## 2023-05-07 DIAGNOSIS — I728 Aneurysm of other specified arteries: Secondary | ICD-10-CM

## 2023-05-07 DIAGNOSIS — E782 Mixed hyperlipidemia: Secondary | ICD-10-CM | POA: Diagnosis not present

## 2023-05-07 DIAGNOSIS — E785 Hyperlipidemia, unspecified: Secondary | ICD-10-CM | POA: Insufficient documentation

## 2023-05-07 DIAGNOSIS — Z0000 Encounter for general adult medical examination without abnormal findings: Secondary | ICD-10-CM

## 2023-05-07 DIAGNOSIS — R7301 Impaired fasting glucose: Secondary | ICD-10-CM | POA: Insufficient documentation

## 2023-05-07 NOTE — Assessment & Plan Note (Signed)
Recently followed by Dr. Durwin Nora.  Will check to see if she supposed to have a follow-up scan at some point.  It looks like it has been years since it was evaluated.  Really important to keep blood pressure well-controlled.

## 2023-05-07 NOTE — Telephone Encounter (Signed)
Please call patient and ask her when was the last time she actually saw Dr. Durwin Nora.  I know he was following up on that splenic arctic root aneurysm that he want to see her back at some point the last record I have was probably 6 7 years ago?

## 2023-05-07 NOTE — Progress Notes (Signed)
Complete physical exam  Patient: Stephanie [name]   DOB: [birth year]   67 y.o. Female  MRN: [id number]  Subjective:    Chief Complaint  Patient presents with   Annual Exam    Stephanie [name] is a 67 y.o. female who presents today for a complete physical exam. She reports consuming a general diet. The patient does not participate in regular exercise at present. She generally feels well. She does not have additional problems to discuss today.   He did want to give me an update since I last saw her she is really been struggling with Achilles tendinitis and has been going to physical therapy and getting additional treatments and just not improving.  She has been referred to podiatry and has an appointment tomorrow as just been a real struggle and it is really impacted her activity level.  So had labs recently revealing a mildly elevated glucose as well as elevated cholesterol levels.  Most recent fall risk assessment:    05/07/2023   11:22 AM  Fall Risk   Falls in the past year? 0  Number falls in past yr: 0  Injury with Fall? 0  Risk for fall due to : No Fall Risks  Follow up Falls evaluation completed     Most recent depression screenings:    05/07/2023   10:55 AM 09/04/2021    9:05 AM  PHQ 2/9 Scores  PHQ - 2 Score 0 0        Patient Care Team: Agapito Games, MD as PCP - General (Family Medicine) Rodolph Bong, MD as Attending Physician (Family Medicine)   Outpatient Medications Prior to Visit  Medication Sig   nitroGLYCERIN (NITRODUR - DOSED IN MG/24 HR) 0.2 mg/hr patch Cut and apply 1/4 patch to most painful area q24h.   No facility-administered medications prior to visit.    ROS        Objective:     BP (!) 131/58 (BP Location: Left Arm, Patient Position: Sitting, Cuff Size: Large)   Pulse 76   Resp 16   Ht 5\' 2"  (1.575 m)   Wt 222 lb 1.9 oz (100.8 kg)   SpO2 98%   BMI 40.63 kg/m    Physical Exam Vitals and nursing note reviewed.   Constitutional:      Appearance: She is well-developed.  HENT:     Head: Normocephalic and atraumatic.     Right Ear: External ear normal.     Left Ear: External ear normal.     Nose: Nose normal.  Eyes:     Conjunctiva/sclera: Conjunctivae normal.     Pupils: Pupils are equal, round, and reactive to light.  Neck:     Thyroid: No thyromegaly.  Cardiovascular:     Rate and Rhythm: Normal rate and regular rhythm.     Heart sounds: Normal heart sounds.  Pulmonary:     Effort: Pulmonary effort is normal.     Breath sounds: Normal breath sounds. No wheezing.  Musculoskeletal:     Cervical back: Neck supple.  Lymphadenopathy:     Cervical: No cervical adenopathy.  Skin:    General: Skin is warm and dry.  Neurological:     Mental Status: She is alert and oriented to person, place, and time.  Psychiatric:        Behavior: Behavior normal.      No results found for any visits on 05/07/23.     Assessment & Plan:    Routine Health  Maintenance and Physical Exam  Immunization History  Administered Date(s) Administered   Influenza,inj,Quad PF,6+ Mos 06/14/2019, 07/13/2020, 06/14/2022   Influenza,inj,quad, With Preservative 08/14/2017   Influenza-Unspecified 08/22/2018, 07/17/2021   PFIZER(Purple Top)SARS-COV-2 Vaccination 01/13/2020, 02/03/2020, 07/17/2021   PNEUMOCOCCAL CONJUGATE-20 05/07/2023   Tdap 08/25/2014, 12/08/2015   Zoster Recombinant(Shingrix) 07/13/2020, 01/03/2021    Health Maintenance  Topic Date Due   Medicare Annual Wellness (AWV)  Never done   COVID-19 Vaccine (4 - 2023-24 season) 06/14/2022   DEXA SCAN  Never done   Colonoscopy  03/30/2023   HIV Screening  06/15/2023 (Originally 08/25/1971)   INFLUENZA VACCINE  05/15/2023   PAP SMEAR-Modifier  10/22/2023   MAMMOGRAM  08/05/2024   DTaP/Tdap/Td (3 - Td or Tdap) 12/07/2025   Pneumonia Vaccine 17+ Years old  Completed   Hepatitis C Screening  Completed   Zoster Vaccines- Shingrix  Completed   HPV  VACCINES  Aged Out    Discussed health benefits of physical activity, and encouraged her to engage in regular exercise appropriate for her age and condition.  Problem List Items Addressed This Visit       Cardiovascular and Mediastinum   Splenic artery aneurysm (HCC) (Chronic)    Recently followed by Dr. Durwin Nora.  Will check to see if she supposed to have a follow-up scan at some point.  It looks like it has been years since it was evaluated.  Really important to keep blood pressure well-controlled.        Endocrine   IFG (impaired fasting glucose)     Other   Hyperlipidemia   Other Visit Diagnoses     Wellness examination    -  Primary   Encounter for immunization       Relevant Orders   Pneumococcal conjugate vaccine 20-valent (Completed)      Keep up a regular exercise program and make sure you are eating a healthy diet Try to eat 4 servings of dairy a day, or if you are lactose intolerant take a calcium with vitamin D daily.  Your vaccines are up to date.  Reviewed recent lab results and discussed strategies around that.  Plan to follow back up in 6 months to recheck A1c.  Return in about 6 months (around 11/07/2023) for Pre-diabetes.     Nani Gasser, MD

## 2023-05-08 ENCOUNTER — Encounter: Payer: Self-pay | Admitting: Podiatry

## 2023-05-08 ENCOUNTER — Ambulatory Visit: Payer: Medicare Other | Admitting: Podiatry

## 2023-05-08 DIAGNOSIS — M7661 Achilles tendinitis, right leg: Secondary | ICD-10-CM | POA: Diagnosis not present

## 2023-05-08 NOTE — Progress Notes (Signed)
  Subjective:  Patient ID: Stephanie [name], female    DOB: 07-Feb-1956,   MRN: [id number]  Chief Complaint  Patient presents with   Tendonitis    67 y.o. female presents for concern of achilles tendonitis that has been going on for about a year or more. Relates she initially had an injury coming out of truck. Since then she has had pain and issues back there. She has tried stretching, nitroglycerin patch, boot, lifts, injection and PT.  Marland Kitchen Denies any other pedal complaints. Denies n/v/f/c.   Past Medical History:  Diagnosis Date   Diverticulitis 01/21/2013   History of kidney stones    Nephrolithiasis     Objective:  Physical Exam: Vascular: DP/PT pulses 2/4 bilateral. CFT <3 seconds. Normal hair growth on digits. No edema.  Skin. No lacerations or abrasions bilateral feet.  Musculoskeletal: MMT 5/5 bilateral lower extremities in DF, PF, Inversion and Eversion. Deceased ROM in DF of ankle joint. Tender to the insertion of the achilles tendon on the right and some tenderness tracking proximally to watershed area but mostly around prominent insertion site. Some pain with DF but most pain with PF. No pain around plantar medial calcaneal tubercle and no pain with calcaneal squeeze.  Neurological: Sensation intact to light touch.   MRI right heel  IMPRESSION: 1. Severe tendinosis of the distal Achilles tendon with enthesopathic changes at the insertion. 2. Mild tendinosis of the peroneus longus with a partial-thickness tear at the level of the plantar lateral calcaneus with mild tenosynovitis and mild peritenonitis. 3. Mild plantar fasciitis of the medial band of the plantar fascia.      Assessment:   1. Insertional Achilles tendinosis, right leg      Plan:  Patient was evaluated and treated and all questions answered. -Xrays reviewed. Severe calcaneal spurring noted posterior and inferior calcaneus. Reviewed MRI as well and noted achilles tendinosis throughout tendon insertion  site.  Reviwed notes and imaging from Dr. Karie Schwalbe.  -Discussed Achilles insertional tendonitis and treatment options with patient.  -Patient has seemed to exhausted conservative treatments, has stretching offloaded, had injection and patches and PT with no relief.  -Discussed at this point surgical intervention to help. Discussed secondary repair of the achilles with haglund resection and possible FHL transfer. Discussed perioperative course and risks in detail.  -Informed surgical risk consent was reviewed and read aloud to the patient.  I reviewed the films.  I have discussed my findings with the patient in great detail.  I have discussed all risks including but not limited to infection, stiffness, scarring, limp, disability, deformity, damage to blood vessels and nerves, numbness, poor healing, need for braces, arthritis, chronic pain, amputation, death.  All benefits and realistic expectations discussed in great detail.  I have made no promises as to the outcome.  I have provided realistic expectations.  I have offered the patient a 2nd opinion, which they have declined and assured me they preferred to proceed despite the risks. -Will plan for post-op antibiotics and BID ASA for prophylaxis.  -Plan tentatively for surgery in August.    Louann Sjogren, DPM

## 2023-05-09 NOTE — Telephone Encounter (Signed)
Patient informed and is agreeable to CT.

## 2023-05-09 NOTE — Telephone Encounter (Signed)
Okay, please let her know that I am going to go ahead and get the CT scheduled for follow-up on that aneurysm.  Per his records he wanted to see you back in 2018 for 3-year follow-up and then if everything looks stable he was good with you doing follow-ups every 5 years.  He felt like CT was the most appropriate exam and did not feel like ultrasound would get deep enough to take a good look at it.  I will go ahead and order the test and get you scheduled.  Orders Placed This Encounter  Procedures   CT ABDOMEN W CONTRAST    Standing Status:   Future    Standing Expiration Date:   05/08/2024    Order Specific Question:   If indicated for the ordered procedure, I authorize the administration of contrast media per Radiology protocol    Answer:   Yes    Order Specific Question:   Does the patient have a contrast media/X-ray dye allergy?    Answer:   No    Order Specific Question:   Preferred imaging location?    Answer:   [facility]    Order Specific Question:   If indicated for the ordered procedure, I authorize the administration of oral contrast media per Radiology protocol    Answer:   Yes

## 2023-05-13 NOTE — Telephone Encounter (Signed)
Orders Placed This Encounter  Procedures   CT ABDOMEN W CONTRAST    Pt will arrive @ 8am to drink oral contrast / ndb    Standing Status:   Future    Standing Expiration Date:   05/08/2024    Order Specific Question:   If indicated for the ordered procedure, I authorize the administration of contrast media per Radiology protocol    Answer:   Yes    Order Specific Question:   Does the patient have a contrast media/X-ray dye allergy?    Answer:   No    Order Specific Question:   Preferred imaging location?    Answer:   [facility]    Order Specific Question:   If indicated for the ordered procedure, I authorize the administration of oral contrast media per Radiology protocol    Answer:   Yes

## 2023-05-14 ENCOUNTER — Telehealth: Payer: Self-pay | Admitting: Podiatry

## 2023-05-14 NOTE — Telephone Encounter (Signed)
[place] PATIENT, CALLED TO SCHEDULE SURGERY. NO ANSWER LEFT VOICEMAIL

## 2023-05-22 ENCOUNTER — Telehealth: Payer: Self-pay | Admitting: Urology

## 2023-05-22 NOTE — Telephone Encounter (Signed)
DOS - 06/03/23  CALCANEAL OSTEOTOMY RIGHT --- 82956 ACHILLES REPAIR RIGHT --- 21308 FLEXOR HALLUCIS LONGUS TRANSFER RIGHT --- 65784  [facility] EFFECTIVE DATE 10/14/22  PER [employer] [place] FOR CPT CODES 69629, 52841 AND [phone] Notification or Prior Authorization is not required for the requested services You are not required to submit a notification/prior authorization based on the information provided. If you have general questions about the prior authorization requirements, visit [email or web address] > Clinician Resources > Advance and Admission Notification Requirements. The number above acknowledges your notification. Please write this reference number down for future reference. If you would like to request an organization determination, please call us at [phone].   Decision ID #: [id number]

## 2023-05-26 ENCOUNTER — Ambulatory Visit (INDEPENDENT_AMBULATORY_CARE_PROVIDER_SITE_OTHER): Payer: Medicare Other

## 2023-05-26 DIAGNOSIS — Z9049 Acquired absence of other specified parts of digestive tract: Secondary | ICD-10-CM | POA: Diagnosis not present

## 2023-05-26 DIAGNOSIS — I728 Aneurysm of other specified arteries: Secondary | ICD-10-CM | POA: Diagnosis not present

## 2023-05-26 MED ORDER — IOHEXOL 300 MG/ML  SOLN
100.0000 mL | Freq: Once | INTRAMUSCULAR | Status: AC | PRN
  Administered 2023-05-26: 100 mL via INTRAVENOUS

## 2023-06-03 ENCOUNTER — Other Ambulatory Visit: Payer: Self-pay | Admitting: Podiatry

## 2023-06-03 DIAGNOSIS — M7661 Achilles tendinitis, right leg: Secondary | ICD-10-CM | POA: Diagnosis not present

## 2023-06-03 DIAGNOSIS — M7731 Calcaneal spur, right foot: Secondary | ICD-10-CM | POA: Diagnosis not present

## 2023-06-03 DIAGNOSIS — G8918 Other acute postprocedural pain: Secondary | ICD-10-CM | POA: Diagnosis not present

## 2023-06-03 MED ORDER — ONDANSETRON HCL 4 MG PO TABS: 4.0000 mg | ORAL_TABLET | Freq: Three times a day (TID) | ORAL | 0 refills | Status: DC | PRN

## 2023-06-03 MED ORDER — OXYCODONE-ACETAMINOPHEN 5-325 MG PO TABS: 1.0000 | ORAL_TABLET | ORAL | 0 refills | Status: AC | PRN

## 2023-06-03 MED ORDER — CEPHALEXIN 500 MG PO CAPS: 500.0000 mg | ORAL_CAPSULE | Freq: Four times a day (QID) | ORAL | 0 refills | Status: DC

## 2023-06-03 MED ORDER — ASPIRIN 81 MG PO TBEC: 81.0000 mg | DELAYED_RELEASE_TABLET | Freq: Two times a day (BID) | ORAL | 2 refills | Status: AC

## 2023-06-06 NOTE — Progress Notes (Signed)
Stephanie [name], great news!  They did not see any change in the splenic aneurysm and it does look calcified which means it is unlikely to change in size over time.  They did see a little bit of plaque buildup in the aorta which is the main blood vessel of the heart.  I still want you to think about going on a statin there is some evidence that it can be helpful in people who have aneurysms and also people who have atherosclerosis or plaque buildup to reduce further plaque buildup.

## 2023-06-12 ENCOUNTER — Ambulatory Visit (INDEPENDENT_AMBULATORY_CARE_PROVIDER_SITE_OTHER): Payer: Medicare Other

## 2023-06-12 ENCOUNTER — Ambulatory Visit (INDEPENDENT_AMBULATORY_CARE_PROVIDER_SITE_OTHER): Payer: Medicare Other | Admitting: Podiatry

## 2023-06-12 ENCOUNTER — Other Ambulatory Visit: Payer: Self-pay

## 2023-06-12 ENCOUNTER — Other Ambulatory Visit: Payer: Self-pay | Admitting: Podiatry

## 2023-06-12 ENCOUNTER — Encounter: Payer: Self-pay | Admitting: Podiatry

## 2023-06-12 DIAGNOSIS — M19071 Primary osteoarthritis, right ankle and foot: Secondary | ICD-10-CM | POA: Diagnosis not present

## 2023-06-12 DIAGNOSIS — Z9889 Other specified postprocedural states: Secondary | ICD-10-CM

## 2023-06-12 DIAGNOSIS — M7731 Calcaneal spur, right foot: Secondary | ICD-10-CM | POA: Diagnosis not present

## 2023-06-12 NOTE — Progress Notes (Signed)
  Subjective:  Patient ID: Stephanie [name], female    DOB: Jan 16, 1956,  MRN: [id number]  No chief complaint on file.   DOS: 06/03/23 Procedure: Right heel haglunds resection and secondary Achilles tendon repair   67 y.o. female returns for POV#1. Relates doing well and managing pain and getting around ok when she needs to.   Review of Systems: Negative except as noted in the HPI. Denies N/V/F/Ch.  Past Medical History:  Diagnosis Date   Diverticulitis 01/21/2013   History of kidney stones    Nephrolithiasis     Current Outpatient Medications:    aspirin EC 81 MG tablet, Take 1 tablet (81 mg total) by mouth in the morning and at bedtime. Swallow whole., Disp: 60 tablet, Rfl: 2   cephALEXin (KEFLEX) 500 MG capsule, Take 1 capsule (500 mg total) by mouth 4 (four) times daily., Disp: 28 capsule, Rfl: 0   ondansetron (ZOFRAN) 4 MG tablet, Take 1 tablet (4 mg total) by mouth every 8 (eight) hours as needed for nausea or vomiting., Disp: 20 tablet, Rfl: 0   nitroGLYCERIN (NITRODUR - DOSED IN MG/24 HR) 0.2 mg/hr patch, Cut and apply 1/4 patch to most painful area q24h., Disp: 30 patch, Rfl: 11  Social History   Tobacco Use  Smoking Status Former   Current packs/day: 0.00   Average packs/day: 0.2 packs/day for 2.0 years (0.4 ttl pk-yrs)   Types: Cigarettes   Start date: 10/14/1974   Quit date: 10/14/1976   Years since quitting: 46.6  Smokeless Tobacco Never    Allergies  Allergen Reactions   Duloxetine Nausea Only   Objective:  There were no vitals filed for this visit. There is no height or weight on file to calculate BMI. Constitutional Well developed. Well nourished.  Vascular Foot warm and well perfused. Capillary refill normal to all digits.   Neurologic Normal speech. Oriented to person, place, and time. Epicritic sensation to light touch grossly present bilaterally.  Dermatologic Skin healing well without signs of infection. Skin edges well coapted without signs of  infection.  Orthopedic: Tenderness to palpation noted about the surgical site.   Radiographs: Interval resection of achilles haglund deformity and previous spurs Assessment:   1. Post-operative state    Plan:  Patient was evaluated and treated and all questions answered.  S/p foot surgery right -Progressing as expected post-operatively. -WB Status: NWB  in CAM boot  -Sutures: intact. -Medications: n/a -Foot redressed.  Follow-up in 2 weeks for suture removal.   No follow-ups on file.

## 2023-06-26 ENCOUNTER — Encounter: Payer: Self-pay | Admitting: Podiatry

## 2023-06-26 ENCOUNTER — Ambulatory Visit (INDEPENDENT_AMBULATORY_CARE_PROVIDER_SITE_OTHER): Payer: Medicare Other | Admitting: Podiatry

## 2023-06-26 DIAGNOSIS — M7661 Achilles tendinitis, right leg: Secondary | ICD-10-CM

## 2023-06-26 DIAGNOSIS — Z9889 Other specified postprocedural states: Secondary | ICD-10-CM

## 2023-06-26 NOTE — Progress Notes (Signed)
  Subjective:  Patient ID: Stephanie [name], female    DOB: 20-May-1956,  MRN: [id number]  No chief complaint on file.   DOS: 06/03/23 Procedure: Right heel haglunds resection and secondary Achilles tendon repair   67 y.o. female returns for POV#2. Relates doing well and managing pain and getting around ok when she needs to.   Review of Systems: Negative except as noted in the HPI. Denies N/V/F/Ch.  Past Medical History:  Diagnosis Date   Diverticulitis 01/21/2013   History of kidney stones    Nephrolithiasis     Current Outpatient Medications:    aspirin EC 81 MG tablet, Take 1 tablet (81 mg total) by mouth in the morning and at bedtime. Swallow whole., Disp: 60 tablet, Rfl: 2   cephALEXin (KEFLEX) 500 MG capsule, Take 1 capsule (500 mg total) by mouth 4 (four) times daily., Disp: 28 capsule, Rfl: 0   ondansetron (ZOFRAN) 4 MG tablet, Take 1 tablet (4 mg total) by mouth every 8 (eight) hours as needed for nausea or vomiting., Disp: 20 tablet, Rfl: 0   nitroGLYCERIN (NITRODUR - DOSED IN MG/24 HR) 0.2 mg/hr patch, Cut and apply 1/4 patch to most painful area q24h., Disp: 30 patch, Rfl: 11  Social History   Tobacco Use  Smoking Status Former   Current packs/day: 0.00   Average packs/day: 0.2 packs/day for 2.0 years (0.4 ttl pk-yrs)   Types: Cigarettes   Start date: 10/14/1974   Quit date: 10/14/1976   Years since quitting: 46.7  Smokeless Tobacco Never    Allergies  Allergen Reactions   Duloxetine Nausea Only   Objective:  There were no vitals filed for this visit. There is no height or weight on file to calculate BMI. Constitutional Well developed. Well nourished.  Vascular Foot warm and well perfused. Capillary refill normal to all digits.   Neurologic Normal speech. Oriented to person, place, and time. Epicritic sensation to light touch grossly present bilaterally.  Dermatologic Skin healing well without signs of infection. Skin edges well coapted without signs of  infection.  Orthopedic: Tenderness to palpation noted about the surgical site.   Radiographs: Interval resection of achilles haglund deformity and previous spurs Assessment:   1. Post-operative state   2. Insertional Achilles tendinosis, right leg     Plan:  Patient was evaluated and treated and all questions answered.  S/p foot surgery right -Progressing as expected post-operatively. -WB Status: NWB  in CAM boot for one more week. Then may begin WBAT in CAM boot for two weeks.  -Sutures: removed today without incident.  -Medications: n/a -Foot redressed. May begin showering as normal tomorrow.   Follow-up in 3 weeks for recheck.   Return in about 3 weeks (around 07/17/2023) for post op.

## 2023-07-17 ENCOUNTER — Ambulatory Visit: Payer: Medicare Other | Admitting: Podiatry

## 2023-07-17 ENCOUNTER — Encounter: Payer: Self-pay | Admitting: Podiatry

## 2023-07-17 DIAGNOSIS — Z9889 Other specified postprocedural states: Secondary | ICD-10-CM

## 2023-07-17 DIAGNOSIS — M7661 Achilles tendinitis, right leg: Secondary | ICD-10-CM

## 2023-07-17 NOTE — Progress Notes (Signed)
Subjective:  Patient ID: Stephanie [name], female    DOB: [birth year],  MRN: [id number]  No chief complaint on file.   DOS: 06/03/23 Procedure: Right heel haglunds resection and secondary Achilles tendon repair   67 y.o. female returns for POV#3. Relates doing well .   Review of Systems: Negative except as noted in the HPI. Denies N/V/F/Ch.  Past Medical History:  Diagnosis Date   Diverticulitis 01/21/2013   History of kidney stones    Nephrolithiasis     Current Outpatient Medications:    aspirin EC 81 MG tablet, Take 1 tablet (81 mg total) by mouth in the morning and at bedtime. Swallow whole., Disp: 60 tablet, Rfl: 2   cephALEXin (KEFLEX) 500 MG capsule, Take 1 capsule (500 mg total) by mouth 4 (four) times daily., Disp: 28 capsule, Rfl: 0   ondansetron (ZOFRAN) 4 MG tablet, Take 1 tablet (4 mg total) by mouth every 8 (eight) hours as needed for nausea or vomiting., Disp: 20 tablet, Rfl: 0   nitroGLYCERIN (NITRODUR - DOSED IN MG/24 HR) 0.2 mg/hr patch, Cut and apply 1/4 patch to most painful area q24h., Disp: 30 patch, Rfl: 11  Social History   Tobacco Use  Smoking Status Former   Current packs/day: 0.00   Average packs/day: 0.2 packs/day for 2.0 years (0.4 ttl pk-yrs)   Types: Cigarettes   Start date: 10/14/1974   Quit date: 10/14/1976   Years since quitting: 46.7  Smokeless Tobacco Never    Allergies  Allergen Reactions   Duloxetine Nausea Only   Objective:  There were no vitals filed for this visit. There is no height or weight on file to calculate BMI. Constitutional Well developed. Well nourished.  Vascular Foot warm and well perfused. Capillary refill normal to all digits.   Neurologic Normal speech. Oriented to person, place, and time. Epicritic sensation to light touch grossly present bilaterally.  Dermatologic Skin healing well without signs of infection. Skin edges well coapted without signs of infection.  Orthopedic: Tenderness to palpation noted about the  surgical site.   Radiographs: Interval resection of achilles haglund deformity and previous spurs Assessment:   1. Post-operative state   2. Insertional Achilles tendinosis, right leg     Plan:  Patient was evaluated and treated and all questions answered.  S/p foot surgery right -Progressing as expected post-operatively. -WB Status: WBAT in CAM boot and may begin transition into regular shoes.    -Referral to PT placed to aid in transition.  -Medications: n/a   Follow-up in 6 weeks for recheck.   No follow-ups on file.

## 2023-07-21 NOTE — Therapy (Signed)
OUTPATIENT PHYSICAL THERAPY LOWER EXTREMITY EVALUATION   Patient Name: Stephanie [name] MRN: [id number] DOB:09/06/1956, 67 y.o., female Today's Date: 07/22/2023  END OF SESSION:  PT End of Session - 07/22/23 0928     Visit Number 1    Date for PT Re-Evaluation 09/16/23    Authorization Type [employer] MCR    Progress Note Due on Visit 10    PT Start Time 0935    PT Stop Time 1020    PT Time Calculation (min) 45 min    Activity Tolerance Patient tolerated treatment well    Behavior During Therapy [facility] for tasks assessed/performed             Past Medical History:  Diagnosis Date   Diverticulitis 01/21/2013   History of kidney stones    Nephrolithiasis    Past Surgical History:  Procedure Laterality Date   CHOLECYSTECTOMY  10/14/1980   JOINT REPLACEMENT Right 09/28/2020   knee   KNEE ARTHROSCOPY Right 02/2020   MANIPULATION KNEE JOINT Right 12/2020   TONSILECTOMY, ADENOIDECTOMY, BILATERAL MYRINGOTOMY AND TUBES     at age 18 approx.    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   S/P revision of total knee, right 09/24/2021   Trochanteric bursitis of left hip 09/22/2020   Lumbar spinal stenosis 10/04/2019   Primary osteoarthritis of right knee 03/03/2018   Cervical pain (neck) 10/02/2015   Insertional Achilles tendinosis, right leg 09/06/2015   Paresthesia and pain of right extremity 09/06/2015   Splenic artery aneurysm (HCC) 01/21/2013   Diverticulitis of colon without hemorrhage  01/21/2013   COSTOCHONDRITIS 01/01/2011   DYSPNEA ON EXERTION 01/01/2011   BENIGN NEOPLASM OF OTHER SPECIFIED SITES 12/18/2009    PCP: Agapito Games, MD   REFERRING PROVIDER: Louann Sjogren, DPM   REFERRING DIAG:  [id number] (ICD-10-CM) - Post-operative state  M76.61 (ICD-10-CM) - Achilles tendinitis, right leg    THERAPY DIAG:  Stiffness of right ankle, not elsewhere classified  Pain in right ankle and joints of right foot  Other abnormalities of gait and mobility  Localized edema  Rationale for Evaluation and Treatment: Rehabilitation  ONSET DATE: 06/03/23 - Right heel haglunds resection and secondary Achilles tendon repair   SUBJECTIVE:   SUBJECTIVE STATEMENT: Started wearing tennis shoe 10/3. She was WB for 2 weeks with the boot on. Has 3 lifts in shoe. Taking out one lift this Thursday and then one per week for 2 weeks after. Does not stand because of R knee and L hip is hurting. Standing is the worst, then walking. Standing limited to 1 min.  PERTINENT HISTORY: R TKR and revision PAIN:  Are you having pain? Yes: NPRS scale: 1-2/10 Pain location: medial heel Pain description: like you have a rock in your shoe Aggravating factors: walking and standing Relieving factors: sitting  R knee pain reported at 4/5 constantly up to 5-6/10 with standing and walking.  PRECAUTIONS: None  RED FLAGS: None   WEIGHT BEARING RESTRICTIONS: Yes WBAT in CAM boot and may  begin transition into regular shoes  FALLS:  Has patient fallen in last 6 months? No  LIVING ENVIRONMENT: Lives with: lives with their spouse Lives in: House/apartment Stairs: No Has following equipment at home: Single point cane, Environmental consultant - 2 wheeled, Environmental consultant - 4 wheeled, and Wheelchair (manual)  OCCUPATION: retired  PLOF: Independent  PATIENT GOALS: to walk on foot normally without shooting pain up the back.  NEXT MD VISIT: November  OBJECTIVE:  Note: Objective measures were completed at  Evaluation unless otherwise noted.  DIAGNOSTIC FINDINGS:  Post surgical XR IMPRESSION: 1. Interval Haglund resection and secondary Achilles tendon repair. 2. Mild talonavicular and navicular-cuneiform osteoarthritis.  PATIENT SURVEYS:  FOTO 52 (goal 83)  COGNITION: Overall cognitive status: Within functional limits for tasks assessed     SENSATION: WFL  EDEMA: edema around post R ankle  POSTURE: flexed trunk   PALPATION: Tender at proximal third of incision and at medial achilles, proximal gastroc  LOWER EXTREMITY ROM:  A/P ROM Right eval Left eval  Hip flexion    Hip extension    Hip abduction    Hip adduction    Hip internal rotation    Hip external rotation    Knee flexion Approximately 90 deg   Knee extension WFL   Ankle dorsiflexion 0/8   Ankle plantarflexion 72   Ankle inversion 27/33   Ankle eversion 20/24    (Blank rows = not tested)  LOWER EXTREMITY MMT:  MMT Right eval Left eval  Hip flexion 4+ 5  Hip extension    Hip abduction    Hip adduction    Hip internal rotation    Hip external rotation    Knee flexion 5 5  Knee extension 5 5  Ankle dorsiflexion 5 5  Ankle plantarflexion NT   Ankle inversion 4 5  Ankle eversion 4 5   (Blank rows = not tested)   FUNCTIONAL TESTS:  5 times sit to stand: 11.2 sec  GAIT: Distance walked: 40 Assistive device utilized: None Level of assistance: Complete Independence Comments: decreased weight shift R, stance time R, step length L   TODAY'S TREATMENT:                                                                                                                              DATE:   07/22/23 See pt ed and HEP   PATIENT EDUCATION:  Education details: PT eval findings, anticipated POC, initial HEP, and precautions for DF to neutral  Person educated: Patient Education method: Explanation, Demonstration, and Handouts Education comprehension: verbalized understanding and returned  demonstration  HOME EXERCISE PROGRAM: Access Code: [email or web address] URL: [email or web address] Date: 07/22/2023 Prepared by: Raynelle Fanning  Exercises - Seated Ankle Circles  - 2 x daily - 7 x weekly - 3 sets - 10 reps - Seated Heel Raise  - 2 x daily - 7 x weekly - 1 sets - 10 reps - Long Sitting Ankle Dorsiflexion AROM  - 2 x  daily - 7 x weekly - 1 sets - 10 reps - Seated Ankle Inversion Eversion AROM  - 3 x daily - 7 x weekly - 3 sets - 10 reps - Standing Weight Shift  - 1 x daily - 7 x weekly - 1 sets - 10 reps  ASSESSMENT:  CLINICAL IMPRESSION: Patient is a 67 y.o. female who was seen today for physical therapy evaluation and treatment for R ankle pain s/p right heel haglunds resection and secondary Achilles tendon repair. She has expected limitations in ROM and strength affecting gait and balance. She has mild edema and some decreased scar tissue mobility with pain at proximal incision and medial heel. She also is limited with standing and walking due to ongoing R knee pain and has had a TKR and revision. She will benefit from skilled PT to address these deficits.    OBJECTIVE IMPAIRMENTS: Abnormal gait, decreased activity tolerance, decreased balance, decreased ROM, decreased strength, increased edema, increased muscle spasms, impaired flexibility, and pain.   ACTIVITY LIMITATIONS: standing, stairs, and locomotion level  PARTICIPATION LIMITATIONS: meal prep, cleaning, laundry, driving, shopping, and community activity  PERSONAL FACTORS: 1 comorbidity: chronic R knee pain  are also affecting patient's functional outcome.   REHAB POTENTIAL: Excellent  CLINICAL DECISION MAKING: Stable/uncomplicated  EVALUATION COMPLEXITY: Low   GOALS: Goals reviewed with patient? Yes  SHORT TERM GOALS: Target date: 08/12/2023 Patient will be independent with initial HEP. Baseline:  Goal status: INITIAL  2.  Patient to demonstrate equal WB with gait and sit to stands.  Baseline:  Goal  status: INITIAL  3.  Patient able to perform SLS for 7 seconds or greater on R LE. Baseline:  Goal status: INITIAL   LONG TERM GOALS: Target date: 09/16/2023  Patient will be independent with advanced/ongoing HEP to improve outcomes and carryover.  Baseline:  Goal status: INITIAL  2.  Patient will report at least 85% improvement in R ankle pain with standing and walking. Baseline:  Goal status: INITIAL  3.  Patient will demonstrate improved functional R ankle AROM to  allow for normal gait and stair mechanics. Baseline:  Goal status: INITIAL  4.  Patient will demonstrate improved R ankle DF to 4+/5 or better. Baseline:  Goal status: INITIAL  5.  Patient will be able to safely ambulate 600' with a normal gait pattern without increased ankle pain to access community.  Baseline:  Goal status: INITIAL  6. Patient will be able to ascend/descend stairs with 1 HR and reciprocal step pattern safely to access home and community.  Baseline: may be limited by R knee Goal status: INITIAL  7.  Patient will report 64 on FOTO  to demonstrate improved functional ability. Baseline:  Goal status: INITIAL   PLAN:  PT FREQUENCY: 2x/week  PT DURATION: 8 weeks  PLANNED INTERVENTIONS: Therapeutic exercises, Therapeutic activity, Neuromuscular re-education, Balance training, Gait training, Patient/Family education, Self Care, Joint mobilization, Stair training, Aquatic Therapy, Dry Needling, Electrical stimulation, Cryotherapy, Moist heat, Taping, Vasopneumatic device, Ionotophoresis 4mg /ml Dexamethasone, and Manual therapy  PLAN FOR NEXT SESSION: No active Stretching into DF until okayed by MD (message sent to MD due to variations in protocols), Focus on ROM, balance and gait to start. Pt may benefit from use of cane to normalize gait initially. Scar massage, MT prn to lower leg. Progress to light bands per protocol.    Ashley Hober, PT  07/22/2023, 10:55 AM

## 2023-07-22 ENCOUNTER — Other Ambulatory Visit: Payer: Self-pay

## 2023-07-22 ENCOUNTER — Ambulatory Visit: Payer: Medicare Other | Attending: Podiatry | Admitting: Physical Therapy

## 2023-07-22 ENCOUNTER — Encounter: Payer: Self-pay | Admitting: Physical Therapy

## 2023-07-22 DIAGNOSIS — M25571 Pain in right ankle and joints of right foot: Secondary | ICD-10-CM | POA: Diagnosis not present

## 2023-07-22 DIAGNOSIS — M7661 Achilles tendinitis, right leg: Secondary | ICD-10-CM | POA: Diagnosis not present

## 2023-07-22 DIAGNOSIS — Z9889 Other specified postprocedural states: Secondary | ICD-10-CM | POA: Diagnosis not present

## 2023-07-22 DIAGNOSIS — R600 Localized edema: Secondary | ICD-10-CM | POA: Diagnosis not present

## 2023-07-22 DIAGNOSIS — R2689 Other abnormalities of gait and mobility: Secondary | ICD-10-CM | POA: Insufficient documentation

## 2023-07-22 DIAGNOSIS — M25671 Stiffness of right ankle, not elsewhere classified: Secondary | ICD-10-CM | POA: Diagnosis not present

## 2023-07-23 ENCOUNTER — Ambulatory Visit: Payer: Medicare Other | Admitting: Podiatry

## 2023-07-23 ENCOUNTER — Encounter: Payer: Self-pay | Admitting: Podiatry

## 2023-07-23 DIAGNOSIS — M7661 Achilles tendinitis, right leg: Secondary | ICD-10-CM

## 2023-07-23 DIAGNOSIS — Z9889 Other specified postprocedural states: Secondary | ICD-10-CM

## 2023-07-23 NOTE — Progress Notes (Signed)
Subjective:  Patient ID: Stephanie [name], female    DOB: [birth year],  MRN: [id number]  Chief Complaint  Patient presents with   Routine Post Op    Pt presents for a routine post op pt stated she is been in pain for the last couple days " I feel like I'm stepping on stones"    DOS: 06/03/23 Procedure: Right heel haglunds resection and secondary Achilles tendon repair   67 y.o. female returns for POV#4. Relates was in PT the other day and after has had increased pain in her shoes with acitivty in one particular spot and was concerned.   Review of Systems: Negative except as noted in the HPI. Denies N/V/F/Ch.  Past Medical History:  Diagnosis Date   Diverticulitis 01/21/2013   History of kidney stones    Nephrolithiasis     Current Outpatient Medications:    aspirin EC 81 MG tablet, Take 1 tablet (81 mg total) by mouth in the morning and at bedtime. Swallow whole. (Patient not taking: Reported on 07/22/2023), Disp: 60 tablet, Rfl: 2   cephALEXin (KEFLEX) 500 MG capsule, Take 1 capsule (500 mg total) by mouth 4 (four) times daily. (Patient not taking: Reported on 07/22/2023), Disp: 28 capsule, Rfl: 0   ondansetron (ZOFRAN) 4 MG tablet, Take 1 tablet (4 mg total) by mouth every 8 (eight) hours as needed for nausea or vomiting. (Patient not taking: Reported on 07/22/2023), Disp: 20 tablet, Rfl: 0   nitroGLYCERIN (NITRODUR - DOSED IN MG/24 HR) 0.2 mg/hr patch, Cut and apply 1/4 patch to most painful area q24h. (Patient not taking: Reported on 07/22/2023), Disp: 30 patch, Rfl: 11  Social History   Tobacco Use  Smoking Status Former   Current packs/day: 0.00   Average packs/day: 0.2 packs/day for 2.0 years (0.4 ttl pk-yrs)   Types: Cigarettes   Start date: 10/14/1974   Quit date: 10/14/1976   Years since quitting: 46.8  Smokeless Tobacco Never    Allergies  Allergen Reactions   Duloxetine Nausea Only   Objective:  There were no vitals filed for this visit. There is no height or weight  on file to calculate BMI. Constitutional Well developed. Well nourished.  Vascular Foot warm and well perfused. Capillary refill normal to all digits.   Neurologic Normal speech. Oriented to person, place, and time. Epicritic sensation to light touch grossly present bilaterally.  Dermatologic Skin healing well without signs of infection. Skin edges well coapted without signs of infection. Achilles and surgical repair appear to still be intact.   Orthopedic: Tenderness to palpation noted about the surgical site.   Radiographs: Interval resection of achilles haglund deformity and previous spurs Assessment:   1. Post-operative state   2. Insertional Achilles tendinosis, right leg      Plan:  Patient was evaluated and treated and all questions answered.  S/p foot surgery right -Progressing as expected post-operatively. -WB Status: WBAT in CAM boot and may begin transition into regular shoes.    -Continue with PT  -Advised to take it easy as best she can and ease up on activity and stretching for now and slowly increase with PT.  -Continue ibuprofen. Will let me know if not better and can add something stronger if needed.  -Medications: n/a   Follow-up in 5 weeks for recheck.   No follow-ups on file.

## 2023-07-24 ENCOUNTER — Ambulatory Visit: Payer: Medicare Other | Admitting: Physical Therapy

## 2023-07-28 NOTE — Therapy (Signed)
OUTPATIENT PHYSICAL THERAPY LOWER EXTREMITY TREATMENT   Patient Name: Stephanie [name] MRN: [id number] DOB:[birth year], 67 y.o., female Today's Date: 07/29/2023  END OF SESSION:  PT End of Session - 07/29/23 1014     Visit Number 2    Date for PT Re-Evaluation 09/16/23    Authorization Type [employer] MCR    Progress Note Due on Visit 10    PT Start Time 1014    PT Stop Time 1101    PT Time Calculation (min) 47 min    Activity Tolerance Patient tolerated treatment well    Behavior During Therapy [facility] for tasks assessed/performed              Past Medical History:  Diagnosis Date   Diverticulitis 01/21/2013   History of kidney stones    Nephrolithiasis    Past Surgical History:  Procedure Laterality Date   CHOLECYSTECTOMY  10/14/1980   JOINT REPLACEMENT Right 09/28/2020   knee   KNEE ARTHROSCOPY Right 02/2020   MANIPULATION KNEE JOINT Right 12/2020   TONSILECTOMY, ADENOIDECTOMY, BILATERAL MYRINGOTOMY AND TUBES     at age 47 approx.    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   S/P revision of total knee, right 09/24/2021   Trochanteric bursitis of left hip 09/22/2020   Lumbar spinal stenosis 10/04/2019   Primary osteoarthritis of right knee 03/03/2018   Cervical pain (neck) 10/02/2015   Insertional Achilles tendinosis, right leg 09/06/2015   Paresthesia and pain of right extremity 09/06/2015   Splenic artery aneurysm (HCC) 01/21/2013   Diverticulitis of colon without  hemorrhage 01/21/2013   COSTOCHONDRITIS 01/01/2011   DYSPNEA ON EXERTION 01/01/2011   BENIGN NEOPLASM OF OTHER SPECIFIED SITES 12/18/2009    PCP: Agapito Games, MD   REFERRING PROVIDER: Louann Sjogren, DPM   REFERRING DIAG:  [phone] (ICD-10-CM) - Post-operative state  M76.61 (ICD-10-CM) - Achilles tendinitis, right leg    THERAPY DIAG:  Stiffness of right ankle, not elsewhere classified  Pain in right ankle and joints of right foot  Other abnormalities of gait and mobility  Localized edema  Rationale for Evaluation and Treatment: Rehabilitation  ONSET DATE: 06/03/23 - Right heel haglunds resection and secondary Achilles tendon repair   SUBJECTIVE:   SUBJECTIVE STATEMENT: Feels poking/burning pain in heel with walking. Had to see MD after first visit because it hurt so much.   Eval: Started wearing tennis shoe 10/3. She was WB for 2 weeks with the boot on. Has 3 lifts in shoe. Taking out one lift this Thursday and then one per week for 2 weeks after. Does not stand because of R knee and L hip is hurting. Standing is the worst, then walking. Standing limited to 1 min.  PERTINENT HISTORY: R TKR and revision PAIN:  Are you having pain? Yes: NPRS scale: 3-4/10 Pain location: medial heel Pain description: like you have a rock in your shoe Aggravating factors: walking and standing Relieving factors: sitting  R knee pain reported at 4/5 constantly up to 5-6/10  with standing and walking.  PRECAUTIONS: None  RED FLAGS: None   WEIGHT BEARING RESTRICTIONS: Yes WBAT in CAM boot and may begin transition into regular shoes  FALLS:  Has patient fallen in last 6 months? No  LIVING ENVIRONMENT: Lives with: lives with their spouse Lives in: House/apartment Stairs: No Has following equipment at home: Single point cane, Environmental consultant - 2 wheeled, Environmental consultant - 4 wheeled, and Wheelchair (manual)  OCCUPATION: retired  PLOF: Independent  PATIENT GOALS: to walk on foot normally  without shooting pain up the back.  NEXT MD VISIT: November  OBJECTIVE:  Note: Objective measures were completed at Evaluation unless otherwise noted.  DIAGNOSTIC FINDINGS:  Post surgical XR IMPRESSION: 1. Interval Haglund resection and secondary Achilles tendon repair. 2. Mild talonavicular and navicular-cuneiform osteoarthritis.  PATIENT SURVEYS:  FOTO 52 (goal 54)  COGNITION: Overall cognitive status: Within functional limits for tasks assessed     SENSATION: WFL  EDEMA: edema around post R ankle  POSTURE: flexed trunk   PALPATION: Tender at proximal third of incision and at medial achilles, proximal gastroc  LOWER EXTREMITY ROM:  A/P ROM Right eval Left eval  Hip flexion    Hip extension    Hip abduction    Hip adduction    Hip internal rotation    Hip external rotation    Knee flexion Approximately 90 deg   Knee extension WFL   Ankle dorsiflexion 0/8   Ankle plantarflexion 72   Ankle inversion 27/33   Ankle eversion 20/24    (Blank rows = not tested)  LOWER EXTREMITY MMT:  MMT Right eval Left eval  Hip flexion 4+ 5  Hip extension    Hip abduction    Hip adduction    Hip internal rotation    Hip external rotation    Knee flexion 5 5  Knee extension 5 5  Ankle dorsiflexion 5 5  Ankle plantarflexion NT   Ankle inversion 4 5  Ankle eversion 4 5   (Blank rows = not tested)   FUNCTIONAL TESTS:  5 times sit to stand: 11.2 sec  GAIT: Distance walked: 40 Assistive device utilized: None Level of assistance: Complete Independence Comments: decreased weight shift R, stance time R, step length L   TODAY'S TREATMENT:                                                                                                                              DATE:   07/29/23 Gait with cane Attempted bike, limited by R knee lack of flexion Seated ankle DF x 20, PF x 20,  inversion* x 20, eversion x 20 Seated heel lifts x 20 Seated achilles stretch with strap 2 x  30 sec Circles x 10 cw/ccw Manual: scar tissue mobilization and STM/IASTM to gastroc/soleus Vasopneumatic: low pressure, 34 deg to R ankle x 10 min  07/22/23 See pt ed and HEP   PATIENT EDUCATION:  Education details: PT eval findings, anticipated POC,  initial HEP, and precautions for DF to neutral  Person educated: Patient Education method: Explanation, Demonstration, and Handouts Education comprehension: verbalized understanding and returned demonstration  HOME EXERCISE PROGRAM: Access Code: [email or web address] URL: [email or web address] Date: 07/22/2023 Prepared by: Raynelle Fanning  Exercises - Seated Ankle Circles  - 2 x daily - 7 x weekly - 3 sets - 10 reps - Seated Heel Raise  - 2 x daily - 7 x weekly - 1 sets - 10 reps - Long Sitting Ankle Dorsiflexion AROM  - 2 x daily - 7 x weekly - 1 sets - 10 reps - Seated Ankle Inversion Eversion AROM  - 3 x daily - 7 x weekly - 3 sets - 10 reps - Standing Weight Shift  - 1 x daily - 7 x weekly - 1 sets - 10 reps  ASSESSMENT:  CLINICAL IMPRESSION: Natale reports that she had marked increase pain after eval and returned to MD. Everything was fine. We practiced gait with SPC today and gait pattern is significantly improved and pain also decreased. She also reported pain with inversion, but this was reduced as well once patient took her shoe off.  She had increased tolerance to manual therapy today and states that she is able to tolerate resting heel on surfaces better overall.   Eval: Patient is a 67 y.o. female who was seen today for physical therapy evaluation and treatment for R ankle pain s/p right heel haglunds resection and secondary Achilles tendon repair. She has expected limitations in ROM and strength affecting gait and balance. She has mild edema and some decreased scar tissue mobility with pain at proximal incision and medial heel. She also is limited with standing and walking due to ongoing R knee pain and has had a TKR and revision. She  will benefit from skilled PT to address these deficits.    OBJECTIVE IMPAIRMENTS: Abnormal gait, decreased activity tolerance, decreased balance, decreased ROM, decreased strength, increased edema, increased muscle spasms, impaired flexibility, and pain.   ACTIVITY LIMITATIONS: standing, stairs, and locomotion level  PARTICIPATION LIMITATIONS: meal prep, cleaning, laundry, driving, shopping, and community activity  PERSONAL FACTORS: 1 comorbidity: chronic R knee pain  are also affecting patient's functional outcome.   REHAB POTENTIAL: Excellent  CLINICAL DECISION MAKING: Stable/uncomplicated  EVALUATION COMPLEXITY: Low   GOALS: Goals reviewed with patient? Yes  SHORT TERM GOALS: Target date: 08/12/2023 Patient will be independent with initial HEP. Baseline:  Goal status: INITIAL  2.  Patient to demonstrate equal WB with gait and sit to stands.  Baseline:  Goal status: INITIAL  3.  Patient able to perform SLS for 7 seconds or greater on R LE. Baseline:  Goal status: INITIAL   LONG TERM GOALS: Target date: 09/16/2023  Patient will be independent with advanced/ongoing HEP to improve outcomes and carryover.  Baseline:  Goal status: INITIAL  2.  Patient will report at least 85% improvement in R ankle pain with standing and walking. Baseline:  Goal status: INITIAL  3.  Patient will demonstrate improved functional R ankle AROM to  allow for normal gait and stair mechanics. Baseline:  Goal status: INITIAL  4.  Patient will demonstrate improved R ankle DF to 4+/5 or better. Baseline:  Goal status: INITIAL  5.  Patient will be able to safely ambulate 600' with a normal gait pattern without increased ankle pain to access community.  Baseline:  Goal status: INITIAL  6. Patient will be able to ascend/descend stairs with 1 HR and reciprocal step pattern safely to access  home and community.  Baseline: may be limited by R knee Goal status: INITIAL  7.  Patient will report 87  on FOTO  to demonstrate improved functional ability. Baseline:  Goal status: INITIAL   PLAN:  PT FREQUENCY: 2x/week  PT DURATION: 8 weeks  PLANNED INTERVENTIONS: Therapeutic exercises, Therapeutic activity, Neuromuscular re-education, Balance training, Gait training, Patient/Family education, Self Care, Joint mobilization, Stair training, Aquatic Therapy, Dry Needling, Electrical stimulation, Cryotherapy, Moist heat, Taping, Vasopneumatic device, Ionotophoresis 4mg /ml Dexamethasone, and Manual therapy  PLAN FOR NEXT SESSION: Follow Willits protocol per MD, Focus on ROM, balance and gait to start. Use SPC until gait normalized. Scar massage, MT prn to lower leg. Progress to light bands per protocol.    Kmarie Paskett, PT  07/29/2023, 10:58 AM

## 2023-07-29 ENCOUNTER — Ambulatory Visit: Payer: Medicare Other | Admitting: Physical Therapy

## 2023-07-29 ENCOUNTER — Encounter: Payer: Self-pay | Admitting: Physical Therapy

## 2023-07-29 DIAGNOSIS — M25571 Pain in right ankle and joints of right foot: Secondary | ICD-10-CM | POA: Diagnosis not present

## 2023-07-29 DIAGNOSIS — Z9889 Other specified postprocedural states: Secondary | ICD-10-CM | POA: Diagnosis not present

## 2023-07-29 DIAGNOSIS — R600 Localized edema: Secondary | ICD-10-CM | POA: Diagnosis not present

## 2023-07-29 DIAGNOSIS — M25671 Stiffness of right ankle, not elsewhere classified: Secondary | ICD-10-CM | POA: Diagnosis not present

## 2023-07-29 DIAGNOSIS — M7661 Achilles tendinitis, right leg: Secondary | ICD-10-CM | POA: Diagnosis not present

## 2023-07-29 DIAGNOSIS — R2689 Other abnormalities of gait and mobility: Secondary | ICD-10-CM | POA: Diagnosis not present

## 2023-07-30 NOTE — Therapy (Signed)
OUTPATIENT PHYSICAL THERAPY LOWER EXTREMITY TREATMENT   Patient Name: Stephanie [name] MRN: [id number] DOB:November 12, 1955, 67 y.o., female Today's Date: 07/31/2023  END OF SESSION:  PT End of Session - 07/31/23 1021     Visit Number 3    Date for PT Re-Evaluation 09/16/23    Authorization Type [employer] MCR    Progress Note Due on Visit 10    PT Start Time 1020    PT Stop Time 1108    PT Time Calculation (min) 48 min    Activity Tolerance Patient tolerated treatment well    Behavior During Therapy [facility] for tasks assessed/performed               Past Medical History:  Diagnosis Date   Diverticulitis 01/21/2013   History of kidney stones    Nephrolithiasis    Past Surgical History:  Procedure Laterality Date   CHOLECYSTECTOMY  10/14/1980   JOINT REPLACEMENT Right 09/28/2020   knee   KNEE ARTHROSCOPY Right 02/2020   MANIPULATION KNEE JOINT Right 12/2020   TONSILECTOMY, ADENOIDECTOMY, BILATERAL MYRINGOTOMY AND TUBES     at age 89 approx.    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   S/P revision of total knee, right 09/24/2021   Trochanteric bursitis of left hip 09/22/2020   Lumbar spinal stenosis 10/04/2019   Primary osteoarthritis of right knee 03/03/2018   Cervical pain (neck) 10/02/2015   Insertional Achilles tendinosis, right leg 09/06/2015   Paresthesia and pain of right extremity 09/06/2015   Splenic artery aneurysm (HCC) 01/21/2013   Diverticulitis of colon without  hemorrhage 01/21/2013   COSTOCHONDRITIS 01/01/2011   DYSPNEA ON EXERTION 01/01/2011   BENIGN NEOPLASM OF OTHER SPECIFIED SITES 12/18/2009    PCP: Agapito Games, MD   REFERRING PROVIDER: Louann Sjogren, DPM   REFERRING DIAG:  [phone] (ICD-10-CM) - Post-operative state  M76.61 (ICD-10-CM) - Achilles tendinitis, right leg    THERAPY DIAG:  Stiffness of right ankle, not elsewhere classified  Pain in right ankle and joints of right foot  Other abnormalities of gait and mobility  Localized edema  Rationale for Evaluation and Treatment: Rehabilitation  ONSET DATE: 06/03/23 - Right heel haglunds resection and secondary Achilles tendon repair   SUBJECTIVE:   SUBJECTIVE STATEMENT: Burning at top of my tennis shoe right now. Still feel bruising where screws are. Took out second lift today.  Eval: Started wearing tennis shoe 10/3. She was WB for 2 weeks with the boot on. Has 3 lifts in shoe. Taking out one lift this Thursday and then one per week for 2 weeks after. Does not stand because of R knee and L hip is hurting. Standing is the worst, then walking. Standing limited to 1 min.  PERTINENT HISTORY: R TKR and revision PAIN:  Are you having pain? Yes: NPRS scale: 3-4/10 Pain location: medial heel Pain description: like you have a rock in your shoe Aggravating factors: walking and standing Relieving factors: sitting  R knee pain reported at 4/5 constantly up to  5-6/10 with standing and walking.  PRECAUTIONS: None  RED FLAGS: None   WEIGHT BEARING RESTRICTIONS: Yes WBAT in CAM boot and may begin transition into regular shoes  FALLS:  Has patient fallen in last 6 months? No  LIVING ENVIRONMENT: Lives with: lives with their spouse Lives in: House/apartment Stairs: No Has following equipment at home: Single point cane, Environmental consultant - 2 wheeled, Environmental consultant - 4 wheeled, and Wheelchair (manual)  OCCUPATION: retired  PLOF: Independent  PATIENT GOALS: to walk on foot  normally without shooting pain up the back.  NEXT MD VISIT: November  OBJECTIVE:  Note: Objective measures were completed at Evaluation unless otherwise noted.  DIAGNOSTIC FINDINGS:  Post surgical XR IMPRESSION: 1. Interval Haglund resection and secondary Achilles tendon repair. 2. Mild talonavicular and navicular-cuneiform osteoarthritis.  PATIENT SURVEYS:  FOTO 52 (goal 71)  COGNITION: Overall cognitive status: Within functional limits for tasks assessed     SENSATION: WFL  EDEMA: edema around post R ankle  POSTURE: flexed trunk   PALPATION: Tender at proximal third of incision and at medial achilles, proximal gastroc  LOWER EXTREMITY ROM:  A/P ROM Right eval Left eval  Hip flexion    Hip extension    Hip abduction    Hip adduction    Hip internal rotation    Hip external rotation    Knee flexion Approximately 90 deg   Knee extension WFL   Ankle dorsiflexion 0/8   Ankle plantarflexion 72   Ankle inversion 27/33   Ankle eversion 20/24    (Blank rows = not tested)  LOWER EXTREMITY MMT:  MMT Right eval Left eval  Hip flexion 4+ 5  Hip extension    Hip abduction    Hip adduction    Hip internal rotation    Hip external rotation    Knee flexion 5 5  Knee extension 5 5  Ankle dorsiflexion 5 5  Ankle plantarflexion NT   Ankle inversion 4 5  Ankle eversion 4 5   (Blank rows = not tested)   FUNCTIONAL TESTS:  5 times sit to stand: 11.2 sec  GAIT: Distance walked: 40 Assistive device utilized: None Level of assistance: Complete Independence Comments: decreased weight shift R, stance time R, step length L   TODAY'S TREATMENT:                                                                                                                              DATE:   07/31/23 Seated ankle DF x 20, PF x 20,  inversion* x 20, eversion x 20 Seated inv/ever Yellow tband x 20 ea Weight shifting standing at chair (painful when shoe is not on) Standing hip ABD,  extension and high march 2x10 at chair B Tandem balance - no problem Sit to stand x 10, functional squat in available knee range x 10 Seated heel lifts x 20  Manual: scar tissue mobilization and STM/IASTM to gastroc/soleus Vasopneumatic: med pressure, 34 deg to  R ankle x 10 min  07/29/23 Gait with cane Attempted bike, limited by R knee lack of flexion Seated ankle DF x 20, PF x 20,  inversion* x 20, eversion x 20 Seated heel lifts x 20 Seated achilles stretch with strap 2 x 30 sec Circles x 10 cw/ccw Manual: scar tissue mobilization and STM/IASTM to gastroc/soleus Vasopneumatic: low pressure, 34 deg to R ankle x 10 min  07/22/23 See pt ed and HEP   PATIENT EDUCATION:  Education details: PT eval findings, anticipated POC, initial HEP, and precautions for DF to neutral  Person educated: Patient Education method: Explanation, Demonstration, and Handouts Education comprehension: verbalized understanding and returned demonstration  HOME EXERCISE PROGRAM: Access Code: [email or web address] URL: [email or web address] Date: 07/31/2023 Prepared by: Raynelle Fanning  Exercises - Seated Ankle Circles  - 2 x daily - 7 x weekly - 3 sets - 10 reps - Seated Heel Raise  - 2 x daily - 7 x weekly - 1 sets - 10 reps - Long Sitting Ankle Dorsiflexion AROM  - 2 x daily - 7 x weekly - 1 sets - 10 reps - Seated Ankle Inversion Eversion AROM  - 3 x daily - 7 x weekly - 3 sets - 10 reps - Standing Weight Shift  - 1 x daily - 7 x weekly - 1 sets - 10 reps - Seated Ankle Inversion with Resistance  - 1 x daily - 3-4 x weekly - 1 sets - 10 reps - Seated Ankle Eversion with Resistance  - 1 x daily - 3-4 x weekly - 1 sets - 10 reps - Standing Hip Abduction with Counter Support  - 1 x daily - 7 x weekly - 3 sets - 10 reps - Standing March with Counter Support  - 1 x daily - 7 x weekly - 3 sets - 10 reps - Standing Hip Extension with Counter Support  - 1 x daily - 7 x weekly - 3 sets - 10  reps  ASSESSMENT:  CLINICAL IMPRESSION: Shantel continues to have burning pain in distal heel when wearing her shoe, but she did not tolerate standing exercises without her shoe on. She will try ice massage at home to address this. She favors R LE so we worked on weight shifting and added standing TE to HEP. Doing well with ankle ROM and strengthening.   Eval: Patient is a 67 y.o. female who was seen today for physical therapy evaluation and treatment for R ankle pain s/p right heel haglunds resection and secondary Achilles tendon repair. She has expected limitations in ROM and strength affecting gait and balance. She has mild edema and some decreased scar tissue mobility with pain at proximal incision and medial heel. She also is limited with standing and walking due to ongoing R knee pain and has had a TKR and revision. She will benefit from skilled PT to address these deficits.    OBJECTIVE IMPAIRMENTS: Abnormal gait, decreased activity tolerance, decreased balance, decreased ROM, decreased strength, increased edema, increased muscle spasms, impaired flexibility, and pain.   ACTIVITY LIMITATIONS: standing, stairs, and locomotion level  PARTICIPATION LIMITATIONS: meal prep, cleaning, laundry, driving, shopping, and community activity  PERSONAL FACTORS: 1 comorbidity: chronic R knee pain  are also affecting patient's functional outcome.   REHAB POTENTIAL: Excellent  CLINICAL DECISION MAKING: Stable/uncomplicated  EVALUATION COMPLEXITY: Low   GOALS: Goals reviewed with patient? Yes  SHORT TERM GOALS: Target date: 08/12/2023 Patient will be independent with initial HEP. Baseline:  Goal status: INITIAL  2.  Patient to demonstrate equal WB with gait and sit to stands.  Baseline:  Goal status: INITIAL  3.  Patient able to perform SLS for 7 seconds or greater on R LE. Baseline:  Goal status: INITIAL   LONG TERM GOALS: Target date: 09/16/2023  Patient will be independent with  advanced/ongoing HEP to improve outcomes and carryover.  Baseline:  Goal status: INITIAL  2.  Patient will report at least 85% improvement in R ankle pain with standing and walking. Baseline:  Goal status: INITIAL  3.  Patient will demonstrate improved functional R ankle AROM to  allow for normal gait and stair mechanics. Baseline:  Goal status: INITIAL  4.  Patient will demonstrate improved R ankle DF to 4+/5 or better. Baseline:  Goal status: INITIAL  5.  Patient will be able to safely ambulate 600' with a normal gait pattern without increased ankle pain to access community.  Baseline:  Goal status: INITIAL  6. Patient will be able to ascend/descend stairs with 1 HR and reciprocal step pattern safely to access home and community.  Baseline: may be limited by R knee Goal status: INITIAL  7.  Patient will report 31 on FOTO  to demonstrate improved functional ability. Baseline:  Goal status: INITIAL   PLAN:  PT FREQUENCY: 2x/week  PT DURATION: 8 weeks  PLANNED INTERVENTIONS: Therapeutic exercises, Therapeutic activity, Neuromuscular re-education, Balance training, Gait training, Patient/Family education, Self Care, Joint mobilization, Stair training, Aquatic Therapy, Dry Needling, Electrical stimulation, Cryotherapy, Moist heat, Taping, Vasopneumatic device, Ionotophoresis 4mg /ml Dexamethasone, and Manual therapy  PLAN FOR NEXT SESSION: Follow Willits protocol per MD (in file), Focus on ROM, balance and gait to start. Use SPC until gait normalized. Scar massage, MT prn to lower leg. Progress to light bands per protocol.    Dimple [place]iquette, PT  07/31/2023, 12:39 PM

## 2023-07-31 ENCOUNTER — Ambulatory Visit: Payer: Medicare Other | Admitting: Physical Therapy

## 2023-07-31 ENCOUNTER — Encounter: Payer: Self-pay | Admitting: Physical Therapy

## 2023-07-31 DIAGNOSIS — R2689 Other abnormalities of gait and mobility: Secondary | ICD-10-CM

## 2023-07-31 DIAGNOSIS — Z9889 Other specified postprocedural states: Secondary | ICD-10-CM | POA: Diagnosis not present

## 2023-07-31 DIAGNOSIS — M25571 Pain in right ankle and joints of right foot: Secondary | ICD-10-CM

## 2023-07-31 DIAGNOSIS — R600 Localized edema: Secondary | ICD-10-CM | POA: Diagnosis not present

## 2023-07-31 DIAGNOSIS — M7661 Achilles tendinitis, right leg: Secondary | ICD-10-CM | POA: Diagnosis not present

## 2023-07-31 DIAGNOSIS — M25671 Stiffness of right ankle, not elsewhere classified: Secondary | ICD-10-CM

## 2023-08-04 ENCOUNTER — Ambulatory Visit: Payer: Medicare Other

## 2023-08-04 DIAGNOSIS — R600 Localized edema: Secondary | ICD-10-CM

## 2023-08-04 DIAGNOSIS — M7661 Achilles tendinitis, right leg: Secondary | ICD-10-CM | POA: Diagnosis not present

## 2023-08-04 DIAGNOSIS — M25571 Pain in right ankle and joints of right foot: Secondary | ICD-10-CM | POA: Diagnosis not present

## 2023-08-04 DIAGNOSIS — Z9889 Other specified postprocedural states: Secondary | ICD-10-CM | POA: Diagnosis not present

## 2023-08-04 DIAGNOSIS — M25671 Stiffness of right ankle, not elsewhere classified: Secondary | ICD-10-CM

## 2023-08-04 DIAGNOSIS — R2689 Other abnormalities of gait and mobility: Secondary | ICD-10-CM | POA: Diagnosis not present

## 2023-08-04 NOTE — Therapy (Signed)
OUTPATIENT PHYSICAL THERAPY LOWER EXTREMITY TREATMENT   Patient Name: Stephanie [name] MRN: [id number] DOB:[birth year], 67 y.o., female Today's Date: 08/04/2023  END OF SESSION:  PT End of Session - 08/04/23 0932     Visit Number 4    Number of Visits 17    Date for PT Re-Evaluation 09/16/23    Authorization Type U[employer] MCR    Authorization Time Period 10/8-12/3/24    Authorization - Visit Number 4    Authorization - Number of Visits 16    Progress Note Due on Visit 10    PT Start Time 0932    PT Stop Time 1025    PT Time Calculation (min) 53 min    Activity Tolerance Patient tolerated treatment well    Behavior During Therapy [facility] for tasks assessed/performed                Past Medical History:  Diagnosis Date   Diverticulitis 01/21/2013   History of kidney stones    Nephrolithiasis    Past Surgical History:  Procedure Laterality Date   CHOLECYSTECTOMY  10/14/1980   JOINT REPLACEMENT Right 09/28/2020   knee   KNEE ARTHROSCOPY Right 02/2020   MANIPULATION KNEE JOINT Right 12/2020   TONSILECTOMY, ADENOIDECTOMY, BILATERAL MYRINGOTOMY AND TUBES     at age 81 approx.    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   S/P revision of total knee, right 09/24/2021   Trochanteric bursitis of left hip 09/22/2020   Lumbar spinal stenosis 10/04/2019   Primary osteoarthritis of right knee 03/03/2018   Cervical pain (neck) 10/02/2015   Insertional Achilles tendinosis, right leg  09/06/2015   Paresthesia and pain of right extremity 09/06/2015   Splenic artery aneurysm (HCC) 01/21/2013   Diverticulitis of colon without hemorrhage 01/21/2013   COSTOCHONDRITIS 01/01/2011   DYSPNEA ON EXERTION 01/01/2011   BENIGN NEOPLASM OF OTHER SPECIFIED SITES 12/18/2009    PCP: Agapito Games, MD   REFERRING PROVIDER: Louann Sjogren, DPM   REFERRING DIAG:  [phone] (ICD-10-CM) - Post-operative state  M76.61 (ICD-10-CM) - Achilles tendinitis, right leg    THERAPY DIAG:  Stiffness of right ankle, not elsewhere classified  Pain in right ankle and joints of right foot  Other abnormalities of gait and mobility  Localized edema  Rationale for Evaluation and Treatment: Rehabilitation  ONSET DATE: 06/03/23 - Right heel haglunds resection and secondary Achilles tendon repair   SUBJECTIVE:   SUBJECTIVE STATEMENT: Patient reports the foot/ankle has been hurting terribly this morning. She put CBD oil on it and is unsure if this has helped. She reports her flare up of pain started this morning. She went to [facility] yesterday and walked using the cart for about an hour.   Eval: Started wearing tennis shoe 10/3. She was WB for 2 weeks with the boot on. Has 3 lifts in shoe. Taking out one lift this Thursday and then one per week for 2 weeks after. Does not stand because of R knee and L hip is hurting.  Standing is the worst, then walking. Standing limited to 1 min.  PERTINENT HISTORY: R TKR and revision PAIN:  Are you having pain? Yes: NPRS scale: 10/10 Pain location: medial heel Pain description: "like a muscle." Aggravating factors: walking and standing Relieving factors: sitting  R knee pain reported at 4/5 constantly up to 5-6/10 with standing and walking.  PRECAUTIONS: None  RED FLAGS: None   WEIGHT BEARING RESTRICTIONS: Yes WBAT in CAM boot and may begin transition into regular shoes  FALLS:  Has patient fallen in last 6 months? No  LIVING  ENVIRONMENT: Lives with: lives with their spouse Lives in: House/apartment Stairs: No Has following equipment at home: Single point cane, Environmental consultant - 2 wheeled, Environmental consultant - 4 wheeled, and Wheelchair (manual)  OCCUPATION: retired  PLOF: Independent  PATIENT GOALS: to walk on foot normally without shooting pain up the back.  NEXT MD VISIT: November  OBJECTIVE:  Note: Objective measures were completed at Evaluation unless otherwise noted.  DIAGNOSTIC FINDINGS:  Post surgical XR IMPRESSION: 1. Interval Haglund resection and secondary Achilles tendon repair. 2. Mild talonavicular and navicular-cuneiform osteoarthritis.  PATIENT SURVEYS:  FOTO 52 (goal 57)  COGNITION: Overall cognitive status: Within functional limits for tasks assessed     SENSATION: WFL  EDEMA: edema around post R ankle  POSTURE: flexed trunk   PALPATION: Tender at proximal third of incision and at medial achilles, proximal gastroc  LOWER EXTREMITY ROM:  A/P ROM Right eval Left eval  Hip flexion    Hip extension    Hip abduction    Hip adduction    Hip internal rotation    Hip external rotation    Knee flexion Approximately 90 deg   Knee extension WFL   Ankle dorsiflexion 0/8   Ankle plantarflexion 72   Ankle inversion 27/33   Ankle eversion 20/24    (Blank rows = not tested)  LOWER EXTREMITY MMT:  MMT Right eval Left eval  Hip flexion 4+ 5  Hip extension    Hip abduction    Hip adduction    Hip internal rotation    Hip external rotation    Knee flexion 5 5  Knee extension 5 5  Ankle dorsiflexion 5 5  Ankle plantarflexion NT   Ankle inversion 4 5  Ankle eversion 4 5   (Blank rows = not tested)   FUNCTIONAL TESTS:  5 times sit to stand: 11.2 sec  GAIT: Distance walked: 40 Assistive device utilized: None Level of assistance: Complete Independence Comments: decreased weight shift R, stance time R, step length L   TODAY'S TREATMENT:        [facility] Adult PT Treatment:                                                 DATE: 08/04/23 Therapeutic Exercise: Seated heel/toe raises 2 x 10  Seated marble pickups 2 x 10  Manual Therapy: Gentle Rt ankle PROM to tolerance Gentle passive Rt gastroc/soleus stretch  STM/trigger point release Rt gastroc/soleus,posterior tib Scar tissue mobilization   Modalities: Ice pack Rt ankle x 10 minutes  Self Care: Energy conservation techniques Ice for pain and swelling as part of home routine Demo use of tennis ball for self-soft tissue mobilization  DATE:   07/31/23 Seated ankle DF x 20, PF x 20,  inversion* x 20, eversion x 20 Seated inv/ever Yellow tband x 20 ea Weight shifting standing at chair (painful when shoe is not on) Standing hip ABD, extension and high march 2x10 at chair B Tandem balance - no problem Sit to stand x 10, functional squat in available knee range x 10 Seated heel lifts x 20  Manual: scar tissue mobilization and STM/IASTM to gastroc/soleus Vasopneumatic: med pressure, 34 deg to R ankle x 10 min  07/29/23 Gait with cane Attempted bike, limited by R knee lack of flexion Seated ankle DF x 20, PF x 20,  inversion* x 20, eversion x 20 Seated heel lifts x 20 Seated achilles stretch with strap 2 x 30 sec Circles x 10 cw/ccw Manual: scar tissue mobilization and STM/IASTM to gastroc/soleus Vasopneumatic: low pressure, 34 deg to R ankle x 10 min  07/22/23 See pt ed and HEP   PATIENT EDUCATION:  Education details: HEP review   Person educated: Patient Education method: Explanation Education comprehension: verbalized understanding  HOME EXERCISE PROGRAM: Access Code: [email or web address] URL: [email or web address] Date: 07/31/2023 Prepared by: Raynelle Fanning  Exercises - Seated Ankle Circles  - 2 x daily - 7 x weekly - 3 sets - 10 reps - Seated Heel Raise  - 2 x daily - 7 x weekly - 1 sets  - 10 reps - Long Sitting Ankle Dorsiflexion AROM  - 2 x daily - 7 x weekly - 1 sets - 10 reps - Seated Ankle Inversion Eversion AROM  - 3 x daily - 7 x weekly - 3 sets - 10 reps - Standing Weight Shift  - 1 x daily - 7 x weekly - 1 sets - 10 reps - Seated Ankle Inversion with Resistance  - 1 x daily - 3-4 x weekly - 1 sets - 10 reps - Seated Ankle Eversion with Resistance  - 1 x daily - 3-4 x weekly - 1 sets - 10 reps - Standing Hip Abduction with Counter Support  - 1 x daily - 7 x weekly - 3 sets - 10 reps - Standing March with Counter Support  - 1 x daily - 7 x weekly - 3 sets - 10 reps - Standing Hip Extension with Counter Support  - 1 x daily - 7 x weekly - 3 sets - 10 reps  ASSESSMENT:  CLINICAL IMPRESSION: Santosha arrives with high pain levels about the Rt foot/ankle likely attributed to prolonged walking activity yesterday. We discussed energy conservation techniques as her ankle continues to heal with patient verbalizing understanding. Heavy emphasis on manual work with patient noted to have multiple trigger points about gastroc/soleus and posterior tibialis with partial release from manual therapy. Continued with ankle mobility and intrinsic foot strengthening with good tolerance.   Eval: Patient is a 67 y.o. female who was seen today for physical therapy evaluation and treatment for R ankle pain s/p right heel haglunds resection and secondary Achilles tendon repair. She has expected limitations in ROM and strength affecting gait and balance. She has mild edema and some decreased scar tissue mobility with pain at proximal incision and medial heel. She also is limited with standing and walking due to ongoing R knee pain and has had a TKR and revision. She will benefit from skilled PT to address these deficits.    OBJECTIVE IMPAIRMENTS: Abnormal gait, decreased activity tolerance, decreased balance, decreased ROM, decreased strength, increased edema, increased muscle spasms, impaired  flexibility, and pain.  ACTIVITY LIMITATIONS: standing, stairs, and locomotion level  PARTICIPATION LIMITATIONS: meal prep, cleaning, laundry, driving, shopping, and community activity  PERSONAL FACTORS: 1 comorbidity: chronic R knee pain  are also affecting patient's functional outcome.   REHAB POTENTIAL: Excellent  CLINICAL DECISION MAKING: Stable/uncomplicated  EVALUATION COMPLEXITY: Low   GOALS: Goals reviewed with patient? Yes  SHORT TERM GOALS: Target date: 08/12/2023 Patient will be independent with initial HEP. Baseline:  Goal status: INITIAL  2.  Patient to demonstrate equal WB with gait and sit to stands.  Baseline:  Goal status: INITIAL  3.  Patient able to perform SLS for 7 seconds or greater on R LE. Baseline:  Goal status: INITIAL   LONG TERM GOALS: Target date: 09/16/2023  Patient will be independent with advanced/ongoing HEP to improve outcomes and carryover.  Baseline:  Goal status: INITIAL  2.  Patient will report at least 85% improvement in R ankle pain with standing and walking. Baseline:  Goal status: INITIAL  3.  Patient will demonstrate improved functional R ankle AROM to  allow for normal gait and stair mechanics. Baseline:  Goal status: INITIAL  4.  Patient will demonstrate improved R ankle DF to 4+/5 or better. Baseline:  Goal status: INITIAL  5.  Patient will be able to safely ambulate 600' with a normal gait pattern without increased ankle pain to access community.  Baseline:  Goal status: INITIAL  6. Patient will be able to ascend/descend stairs with 1 HR and reciprocal step pattern safely to access home and community.  Baseline: may be limited by R knee Goal status: INITIAL  7.  Patient will report 80 on FOTO  to demonstrate improved functional ability. Baseline:  Goal status: INITIAL   PLAN:  PT FREQUENCY: 2x/week  PT DURATION: 8 weeks  PLANNED INTERVENTIONS: Therapeutic exercises, Therapeutic activity, Neuromuscular  re-education, Balance training, Gait training, Patient/Family education, Self Care, Joint mobilization, Stair training, Aquatic Therapy, Dry Needling, Electrical stimulation, Cryotherapy, Moist heat, Taping, Vasopneumatic device, Ionotophoresis 4mg /ml Dexamethasone, and Manual therapy  PLAN FOR NEXT SESSION: Follow Willits protocol per MD (in file), Focus on ROM, balance and gait to start. Use SPC until gait normalized. Scar massage, MT prn to lower leg. Progress to light bands per protocol.   Letitia Libra, PT, DPT, ATC 08/04/23 10:48 AM

## 2023-08-06 ENCOUNTER — Ambulatory Visit: Payer: Medicare Other

## 2023-08-06 DIAGNOSIS — M7661 Achilles tendinitis, right leg: Secondary | ICD-10-CM | POA: Diagnosis not present

## 2023-08-06 DIAGNOSIS — R600 Localized edema: Secondary | ICD-10-CM | POA: Diagnosis not present

## 2023-08-06 DIAGNOSIS — Z9889 Other specified postprocedural states: Secondary | ICD-10-CM | POA: Diagnosis not present

## 2023-08-06 DIAGNOSIS — M25671 Stiffness of right ankle, not elsewhere classified: Secondary | ICD-10-CM

## 2023-08-06 DIAGNOSIS — M25571 Pain in right ankle and joints of right foot: Secondary | ICD-10-CM

## 2023-08-06 DIAGNOSIS — R2689 Other abnormalities of gait and mobility: Secondary | ICD-10-CM | POA: Diagnosis not present

## 2023-08-06 NOTE — Therapy (Signed)
OUTPATIENT PHYSICAL THERAPY LOWER EXTREMITY TREATMENT   Patient Name: Stephanie [name] MRN: [id number] DOB:1956/03/14, 67 y.o., female Today's Date: 08/06/2023  END OF SESSION:  PT End of Session - 08/06/23 1017     Visit Number 5    Number of Visits 17    Date for PT Re-Evaluation 09/16/23    Authorization Type UHC MCR    Authorization Time Period 16 visits approved for PT 07/22/2023-09/16/2023    Authorization - Visit Number 5    Authorization - Number of Visits 16    Progress Note Due on Visit 10    PT Start Time 1018    PT Stop Time 1112    PT Time Calculation (min) 54 min    Activity Tolerance Patient tolerated treatment well    Behavior During Therapy [facility] for tasks assessed/performed            Past Medical History:  Diagnosis Date   Diverticulitis 01/21/2013   History of kidney stones    Nephrolithiasis    Past Surgical History:  Procedure Laterality Date   CHOLECYSTECTOMY  10/14/1980   JOINT REPLACEMENT Right 09/28/2020   knee   KNEE ARTHROSCOPY Right 02/2020   MANIPULATION KNEE JOINT Right 12/2020   TONSILECTOMY, ADENOIDECTOMY, BILATERAL MYRINGOTOMY AND TUBES     at age 81 approx.    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   S/P revision of total knee, right 09/24/2021   Trochanteric bursitis of left hip 09/22/2020   Lumbar spinal stenosis 10/04/2019   Primary osteoarthritis of right knee 03/03/2018   Cervical pain (neck) 10/02/2015   Insertional Achilles  tendinosis, right leg 09/06/2015   Paresthesia and pain of right extremity 09/06/2015   Splenic artery aneurysm (HCC) 01/21/2013   Diverticulitis of colon without hemorrhage 01/21/2013   COSTOCHONDRITIS 01/01/2011   DYSPNEA ON EXERTION 01/01/2011   BENIGN NEOPLASM OF OTHER SPECIFIED SITES 12/18/2009    PCP: Agapito Games, MD   REFERRING PROVIDER: Louann Sjogren, DPM   REFERRING DIAG:  [phone] (ICD-10-CM) - Post-operative state  M76.61 (ICD-10-CM) - Achilles tendinitis, right leg    THERAPY DIAG:  Stiffness of right ankle, not elsewhere classified  Pain in right ankle and joints of right foot  Other abnormalities of gait and mobility  Localized edema  Rationale for Evaluation and Treatment: Rehabilitation  ONSET DATE: 06/03/23 - Right heel haglunds resection and secondary Achilles tendon repair   SUBJECTIVE:   SUBJECTIVE STATEMENT: Patient reports she is doing less walking and is icing at home and has it wrapped around the ankle, which is less intense. Patient reports 2/10 pain in heel, states there is a "burning" sensation where the back of her sneaker rubs the heel.  Eval: Started wearing tennis shoe 10/3. She was WB for 2 weeks with the boot on. Has 3 lifts in shoe. Taking out one lift this Thursday and then one per week for 2 weeks after. Does not stand because of R knee and L hip is hurting. Standing is the  worst, then walking. Standing limited to 1 min.  PERTINENT HISTORY: R TKR and revision PAIN:  Are you having pain? Yes: NPRS scale: 10/10 Pain location: medial heel Pain description: "like a muscle." Aggravating factors: walking and standing Relieving factors: sitting  R knee pain reported at 4/5 constantly up to 5-6/10 with standing and walking.  PRECAUTIONS: None  RED FLAGS: None   WEIGHT BEARING RESTRICTIONS: Yes WBAT in CAM boot and may begin transition into regular shoes  FALLS:  Has patient fallen in last 6 months? No  LIVING  ENVIRONMENT: Lives with: lives with their spouse Lives in: House/apartment Stairs: No Has following equipment at home: Single point cane, Environmental consultant - 2 wheeled, Environmental consultant - 4 wheeled, and Wheelchair (manual)  OCCUPATION: retired  PLOF: Independent  PATIENT GOALS: to walk on foot normally without shooting pain up the back.  NEXT MD VISIT: November  OBJECTIVE:  Note: Objective measures were completed at Evaluation unless otherwise noted.  DIAGNOSTIC FINDINGS:  Post surgical XR IMPRESSION: 1. Interval Haglund resection and secondary Achilles tendon repair. 2. Mild talonavicular and navicular-cuneiform osteoarthritis.  PATIENT SURVEYS:  FOTO 52 (goal 6)  COGNITION: Overall cognitive status: Within functional limits for tasks assessed     SENSATION: WFL  EDEMA: edema around post R ankle  POSTURE: flexed trunk   PALPATION: Tender at proximal third of incision and at medial achilles, proximal gastroc  LOWER EXTREMITY ROM:  A/P ROM Right eval Left eval  Hip flexion    Hip extension    Hip abduction    Hip adduction    Hip internal rotation    Hip external rotation    Knee flexion Approximately 90 deg   Knee extension WFL   Ankle dorsiflexion 0/8   Ankle plantarflexion 72   Ankle inversion 27/33   Ankle eversion 20/24    (Blank rows = not tested)  LOWER EXTREMITY MMT:  MMT Right eval Left eval  Hip flexion 4+ 5  Hip extension    Hip abduction    Hip adduction    Hip internal rotation    Hip external rotation    Knee flexion 5 5  Knee extension 5 5  Ankle dorsiflexion 5 5  Ankle plantarflexion NT   Ankle inversion 4 5  Ankle eversion 4 5   (Blank rows = not tested)   FUNCTIONAL TESTS:  5 times sit to stand: 11.2 sec  GAIT: Distance walked: 40 Assistive device utilized: None Level of assistance: Complete Independence Comments: decreased weight shift R, stance time R, step length L   TODAY'S TREATMENT:        [facility] Adult PT Treatment:                                                 DATE: 08/06/2023 Therapeutic Exercise: Seated heel/toe x20 Seated marble pickups --> marble pick ups with paired toes Walking with focus on rolling through foot Staggered stance weight shifting with focus on great toe push off Resisted side stepping RTB crossed at ankles x5 passes down/up at counter Runner's lunge stretch Manual Therapy: Scar massage along incision --> provided instruction for HEP IASTM  Modalities: Ice pack R ankle x 10 minutes    [facility] Adult PT Treatment:  DATE: 08/04/23 Therapeutic Exercise: Seated heel/toe raises 2 x 10  Seated marble pickups 2 x 10  Manual Therapy: Gentle Rt ankle PROM to tolerance Gentle passive Rt gastroc/soleus stretch  STM/trigger point release Rt gastroc/soleus,posterior tib Scar tissue mobilization  Modalities: Ice pack Rt ankle x 10 minutes  Self Care: Energy conservation techniques Ice for pain and swelling as part of home routine Demo use of tennis ball for self-soft tissue mobilization                                                                                                                         DATE:   07/31/23 Seated ankle DF x 20, PF x 20,  inversion* x 20, eversion x 20 Seated inv/ever Yellow tband x 20 ea Weight shifting standing at chair (painful when shoe is not on) Standing hip ABD, extension and high march 2x10 at chair B Tandem balance - no problem Sit to stand x 10, functional squat in available knee range x 10 Seated heel lifts x 20 Manual: scar tissue mobilization and STM/IASTM to gastroc/soleus Vasopneumatic: med pressure, 34 deg to R ankle x 10 min   PATIENT EDUCATION:  Education details: HEP review   Person educated: Patient Education method: Explanation Education comprehension: verbalized understanding  HOME EXERCISE PROGRAM: Access Code: [email or web address] URL: [email or web address] Date:  07/31/2023 Prepared by: Raynelle Fanning  Exercises - Seated Ankle Circles  - 2 x daily - 7 x weekly - 3 sets - 10 reps - Seated Heel Raise  - 2 x daily - 7 x weekly - 1 sets - 10 reps - Long Sitting Ankle Dorsiflexion AROM  - 2 x daily - 7 x weekly - 1 sets - 10 reps - Seated Ankle Inversion Eversion AROM  - 3 x daily - 7 x weekly - 3 sets - 10 reps - Standing Weight Shift  - 1 x daily - 7 x weekly - 1 sets - 10 reps - Seated Ankle Inversion with Resistance  - 1 x daily - 3-4 x weekly - 1 sets - 10 reps - Seated Ankle Eversion with Resistance  - 1 x daily - 3-4 x weekly - 1 sets - 10 reps - Standing Hip Abduction with Counter Support  - 1 x daily - 7 x weekly - 3 sets - 10 reps - Standing March with Counter Support  - 1 x daily - 7 x weekly - 3 sets - 10 reps - Standing Hip Extension with Counter Support  - 1 x daily - 7 x weekly - 3 sets - 10 reps  ASSESSMENT:  CLINICAL IMPRESSION: Patient instructed in scar massage along incision site; improved sensitivity noted at incision after treatment. Patient exhibited improved gait pattern post manual treatment. Noted fatigue with hip strengthening during resisted side stepping.  Eval: Patient is a 67 y.o. female who was seen today for physical therapy evaluation and treatment for R ankle pain s/p right heel haglunds resection and secondary Achilles tendon repair. She has  expected limitations in ROM and strength affecting gait and balance. She has mild edema and some decreased scar tissue mobility with pain at proximal incision and medial heel. She also is limited with standing and walking due to ongoing R knee pain and has had a TKR and revision. She will benefit from skilled PT to address these deficits.    OBJECTIVE IMPAIRMENTS: Abnormal gait, decreased activity tolerance, decreased balance, decreased ROM, decreased strength, increased edema, increased muscle spasms, impaired flexibility, and pain.   ACTIVITY LIMITATIONS: standing, stairs, and locomotion  level  PARTICIPATION LIMITATIONS: meal prep, cleaning, laundry, driving, shopping, and community activity  PERSONAL FACTORS: 1 comorbidity: chronic R knee pain  are also affecting patient's functional outcome.   REHAB POTENTIAL: Excellent  CLINICAL DECISION MAKING: Stable/uncomplicated  EVALUATION COMPLEXITY: Low   GOALS: Goals reviewed with patient? Yes  SHORT TERM GOALS: Target date: 08/12/2023 Patient will be independent with initial HEP. Baseline:  Goal status: INITIAL  2.  Patient to demonstrate equal WB with gait and sit to stands.  Baseline:  Goal status: INITIAL  3.  Patient able to perform SLS for 7 seconds or greater on R LE. Baseline:  Goal status: INITIAL   LONG TERM GOALS: Target date: 09/16/2023  Patient will be independent with advanced/ongoing HEP to improve outcomes and carryover.  Baseline:  Goal status: INITIAL  2.  Patient will report at least 85% improvement in R ankle pain with standing and walking. Baseline:  Goal status: INITIAL  3.  Patient will demonstrate improved functional R ankle AROM to  allow for normal gait and stair mechanics. Baseline:  Goal status: INITIAL  4.  Patient will demonstrate improved R ankle DF to 4+/5 or better. Baseline:  Goal status: INITIAL  5.  Patient will be able to safely ambulate 600' with a normal gait pattern without increased ankle pain to access community.  Baseline:  Goal status: INITIAL  6. Patient will be able to ascend/descend stairs with 1 HR and reciprocal step pattern safely to access home and community.  Baseline: may be limited by R knee Goal status: INITIAL  7.  Patient will report 68 on FOTO  to demonstrate improved functional ability. Baseline:  Goal status: INITIAL   PLAN:  PT FREQUENCY: 2x/week  PT DURATION: 8 weeks  PLANNED INTERVENTIONS: Therapeutic exercises, Therapeutic activity, Neuromuscular re-education, Balance training, Gait training, Patient/Family education, Self Care,  Joint mobilization, Stair training, Aquatic Therapy, Dry Needling, Electrical stimulation, Cryotherapy, Moist heat, Taping, Vasopneumatic device, Ionotophoresis 4mg /ml Dexamethasone, and Manual therapy  PLAN FOR NEXT SESSION: Follow Willits protocol per MD (in file), Focus on ROM, balance and gait to start. Use SPC until gait normalized. Scar massage, MT prn to lower leg. Progress to light bands per protocol.   Carlynn Herald, PTA 08/06/23 11:08 AM

## 2023-08-18 ENCOUNTER — Ambulatory Visit: Payer: Medicare Other

## 2023-08-20 ENCOUNTER — Encounter: Payer: Self-pay | Admitting: Podiatry

## 2023-08-20 ENCOUNTER — Ambulatory Visit (INDEPENDENT_AMBULATORY_CARE_PROVIDER_SITE_OTHER): Payer: Medicare Other | Admitting: Podiatry

## 2023-08-20 ENCOUNTER — Ambulatory Visit: Payer: Medicare Other

## 2023-08-20 DIAGNOSIS — Z1231 Encounter for screening mammogram for malignant neoplasm of breast: Secondary | ICD-10-CM | POA: Diagnosis not present

## 2023-08-20 DIAGNOSIS — M7661 Achilles tendinitis, right leg: Secondary | ICD-10-CM

## 2023-08-20 DIAGNOSIS — Z9889 Other specified postprocedural states: Secondary | ICD-10-CM

## 2023-08-20 MED ORDER — METHYLPREDNISOLONE 4 MG PO TBPK: ORAL_TABLET | ORAL | 0 refills | Status: DC

## 2023-08-20 NOTE — Progress Notes (Signed)
  Subjective:  Patient ID: Stephanie [name], female    DOB: 1956-05-22,  MRN: [id number]  No chief complaint on file.   DOS: 06/03/23 Procedure: Right heel haglunds resection and secondary Achilles tendon repair   67 y.o. female returns for POV#5. Relates she has had more pain in the heel recently and was unable to go to PT the last couple times.   Review of Systems: Negative except as noted in the HPI. Denies N/V/F/Ch.  Past Medical History:  Diagnosis Date   Diverticulitis 01/21/2013   History of kidney stones    Nephrolithiasis     Current Outpatient Medications:    aspirin EC 81 MG tablet, Take 1 tablet (81 mg total) by mouth in the morning and at bedtime. Swallow whole. (Patient not taking: Reported on 07/22/2023), Disp: 60 tablet, Rfl: 2   cephALEXin (KEFLEX) 500 MG capsule, Take 1 capsule (500 mg total) by mouth 4 (four) times daily. (Patient not taking: Reported on 07/22/2023), Disp: 28 capsule, Rfl: 0   ondansetron (ZOFRAN) 4 MG tablet, Take 1 tablet (4 mg total) by mouth every 8 (eight) hours as needed for nausea or vomiting. (Patient not taking: Reported on 07/22/2023), Disp: 20 tablet, Rfl: 0   nitroGLYCERIN (NITRODUR - DOSED IN MG/24 HR) 0.2 mg/hr patch, Cut and apply 1/4 patch to most painful area q24h. (Patient not taking: Reported on 07/22/2023), Disp: 30 patch, Rfl: 11  Social History   Tobacco Use  Smoking Status Former   Current packs/day: 0.00   Average packs/day: 0.2 packs/day for 2.0 years (0.4 ttl pk-yrs)   Types: Cigarettes   Start date: 10/14/1974   Quit date: 10/14/1976   Years since quitting: 46.8  Smokeless Tobacco Never    Allergies  Allergen Reactions   Duloxetine Nausea Only   Objective:  There were no vitals filed for this visit. There is no height or weight on file to calculate BMI. Constitutional Well developed. Well nourished.  Vascular Foot warm and well perfused. Capillary refill normal to all digits.   Neurologic Normal speech. Oriented to  person, place, and time. Epicritic sensation to light touch grossly present bilaterally.  Dermatologic Skin healing well without signs of infection. Skin edges well coapted without signs of infection. Achilles and surgical repair appear to still be intact.   Orthopedic: Tenderness to palpation noted about the surgical site.   Radiographs: Interval resection of achilles haglund deformity and previous spurs Assessment:   1. Post-operative state   2. Insertional Achilles tendinosis, right leg      Plan:  Patient was evaluated and treated and all questions answered.  S/p foot surgery right -Progressing as expected post-operatively. -WB Status: WBAT in CAM boot   -Will hold off on PT for now -Medrol dose pack provided.  -Medications: n/a   Patient return in 1 week for recheck. .   No follow-ups on file.

## 2023-08-22 ENCOUNTER — Encounter: Payer: Self-pay | Admitting: Gastroenterology

## 2023-08-22 NOTE — Progress Notes (Signed)
Please call patient. Normal mammogram.  Repeat in 1 year.  

## 2023-08-26 ENCOUNTER — Ambulatory Visit: Payer: Medicare Other

## 2023-08-28 ENCOUNTER — Ambulatory Visit: Payer: Medicare Other

## 2023-08-28 ENCOUNTER — Ambulatory Visit: Payer: Medicare Other | Admitting: Podiatry

## 2023-08-28 ENCOUNTER — Encounter: Payer: Self-pay | Admitting: Podiatry

## 2023-08-28 DIAGNOSIS — M7661 Achilles tendinitis, right leg: Secondary | ICD-10-CM

## 2023-08-28 DIAGNOSIS — Z9889 Other specified postprocedural states: Secondary | ICD-10-CM

## 2023-08-28 MED ORDER — METHYLPREDNISOLONE 4 MG PO TBPK: ORAL_TABLET | ORAL | 0 refills | Status: DC

## 2023-08-28 NOTE — Progress Notes (Signed)
  Subjective:  Patient ID: Stephanie [name], female    DOB: Nov 02, 1955,  MRN: [id number]  No chief complaint on file.   DOS: 06/03/23 Procedure: Right heel haglunds resection and secondary Achilles tendon repair   66 y.o. female returns for POV#6. Relates the medrol dose pack helped get rid of all of her pain but then it returned after she stopped. Relates continued pain when walking and one sensitive spot on the back of the heel.   Review of Systems: Negative except as noted in the HPI. Denies N/V/F/Ch.  Past Medical History:  Diagnosis Date   Diverticulitis 01/21/2013   History of kidney stones    Nephrolithiasis     Current Outpatient Medications:    aspirin EC 81 MG tablet, Take 1 tablet (81 mg total) by mouth in the morning and at bedtime. Swallow whole. (Patient not taking: Reported on 07/22/2023), Disp: 60 tablet, Rfl: 2   cephALEXin (KEFLEX) 500 MG capsule, Take 1 capsule (500 mg total) by mouth 4 (four) times daily. (Patient not taking: Reported on 07/22/2023), Disp: 28 capsule, Rfl: 0   ondansetron (ZOFRAN) 4 MG tablet, Take 1 tablet (4 mg total) by mouth every 8 (eight) hours as needed for nausea or vomiting. (Patient not taking: Reported on 07/22/2023), Disp: 20 tablet, Rfl: 0   methylPREDNISolone (MEDROL DOSEPAK) 4 MG TBPK tablet, Take as directed, Disp: 21 tablet, Rfl: 0   nitroGLYCERIN (NITRODUR - DOSED IN MG/24 HR) 0.2 mg/hr patch, Cut and apply 1/4 patch to most painful area q24h. (Patient not taking: Reported on 07/22/2023), Disp: 30 patch, Rfl: 11  Social History   Tobacco Use  Smoking Status Former   Current packs/day: 0.00   Average packs/day: 0.2 packs/day for 2.0 years (0.4 ttl pk-yrs)   Types: Cigarettes   Start date: 10/14/1974   Quit date: 10/14/1976   Years since quitting: 46.9  Smokeless Tobacco Never    Allergies  Allergen Reactions   Duloxetine Nausea Only   Objective:  There were no vitals filed for this visit. There is no height or weight on file to  calculate BMI. Constitutional Well developed. Well nourished.  Vascular Foot warm and well perfused. Capillary refill normal to all digits.   Neurologic Normal speech. Oriented to person, place, and time. Epicritic sensation to light touch grossly present bilaterally.  Dermatologic Skin healing well without signs of infection. Skin edges well coapted without signs of infection. Achilles and surgical repair appear to still be intact.   Orthopedic: Tenderness to palpation noted about the surgical site.   Radiographs: Interval resection of achilles haglund deformity and previous spurs Assessment:   1. Post-operative state   2. Insertional Achilles tendinosis, right leg       Plan:  Patient was evaluated and treated and all questions answered.  S/p foot surgery right -Progressing as expected post-operatively. -WB Status: WBAT in CAM boot  Will start back in PT and have them aid in transition from boot to regular shoes. Advised on a slow transition starting next week with one hour out of the boot on the first day and gradually increasing.  -Can consider PRP injection.  -Medications: Medrol dose pack to be taken as needed for any continued pain.   Patient return in 3 weeks for recheck. .   Return in about 3 weeks (around 09/18/2023) for post op.

## 2023-09-01 ENCOUNTER — Ambulatory Visit: Payer: Medicare Other | Attending: Podiatry

## 2023-09-01 DIAGNOSIS — M25571 Pain in right ankle and joints of right foot: Secondary | ICD-10-CM | POA: Insufficient documentation

## 2023-09-01 DIAGNOSIS — M25671 Stiffness of right ankle, not elsewhere classified: Secondary | ICD-10-CM | POA: Insufficient documentation

## 2023-09-01 DIAGNOSIS — R2689 Other abnormalities of gait and mobility: Secondary | ICD-10-CM | POA: Insufficient documentation

## 2023-09-01 DIAGNOSIS — R600 Localized edema: Secondary | ICD-10-CM | POA: Insufficient documentation

## 2023-09-01 NOTE — Therapy (Signed)
OUTPATIENT PHYSICAL THERAPY LOWER EXTREMITY TREATMENT   Patient Name: Stephanie [name] MRN: [id number] DOB:Jul 25, 1956, 67 y.o., female Today's Date: 09/01/2023  END OF SESSION:  PT End of Session - 09/01/23 1319     Visit Number 6    Number of Visits 17    Date for PT Re-Evaluation 09/16/23    Authorization Type [employer] MCR    Authorization Time Period 16 visits approved for PT 07/22/2023-09/16/2023    Authorization - Visit Number 6    Authorization - Number of Visits 16    Progress Note Due on Visit 10    PT Start Time 1316    PT Stop Time 1359    PT Time Calculation (min) 43 min    Activity Tolerance Patient tolerated treatment well    Behavior During Therapy [facility] for tasks assessed/performed            Past Medical History:  Diagnosis Date   Diverticulitis 01/21/2013   History of kidney stones    Nephrolithiasis    Past Surgical History:  Procedure Laterality Date   CHOLECYSTECTOMY  10/14/1980   JOINT REPLACEMENT Right 09/28/2020   knee   KNEE ARTHROSCOPY Right 02/2020   MANIPULATION KNEE JOINT Right 12/2020   TONSILECTOMY, ADENOIDECTOMY, BILATERAL MYRINGOTOMY AND TUBES     at age 20 approx.    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   S/P revision of total knee, right 09/24/2021   Trochanteric bursitis of left hip 09/22/2020   Lumbar spinal stenosis 10/04/2019   Primary osteoarthritis of right knee 03/03/2018   Cervical pain (neck) 10/02/2015   Insertional Achilles  tendinosis, right leg 09/06/2015   Paresthesia and pain of right extremity 09/06/2015   Splenic artery aneurysm (HCC) 01/21/2013   Diverticulitis of colon without hemorrhage 01/21/2013   COSTOCHONDRITIS 01/01/2011   DYSPNEA ON EXERTION 01/01/2011   BENIGN NEOPLASM OF OTHER SPECIFIED SITES 12/18/2009    PCP: Agapito Games, MD   REFERRING PROVIDER: Louann Sjogren, DPM   REFERRING DIAG:  [phone] (ICD-10-CM) - Post-operative state  M76.61 (ICD-10-CM) - Achilles tendinitis, right leg    THERAPY DIAG:  Stiffness of right ankle, not elsewhere classified  Pain in right ankle and joints of right foot  Other abnormalities of gait and mobility  Localized edema  Rationale for Evaluation and Treatment: Rehabilitation  ONSET DATE: 06/03/23 - Right heel haglunds resection and secondary Achilles tendon repair   SUBJECTIVE:   SUBJECTIVE STATEMENT: Patient reports she was camping and her foot flared way up and was at a 10/10 pain and contacted MD about it, who told her to hold off on PT until she was able to see MD about ankle. Patient has been back to wearing cam boot for 2 weeks, states MD told her to have PT transition her out of boot. Patient states she has very mild pain in ankle/foot.  *per MD note: transition out of boot, begin with 1 hour out of boot.   Eval: Started wearing tennis shoe 10/3. She was  WB for 2 weeks with the boot on. Has 3 lifts in shoe. Taking out one lift this Thursday and then one per week for 2 weeks after. Does not stand because of R knee and L hip is hurting. Standing is the worst, then walking. Standing limited to 1 min.  PERTINENT HISTORY: R TKR and revision PAIN:  Are you having pain? Yes: NPRS scale: 10/10 Pain location: medial heel Pain description: "like a muscle." Aggravating factors: walking and standing Relieving factors: sitting  R knee pain reported at 4/5 constantly up to 5-6/10 with standing and walking.  PRECAUTIONS: None  RED  FLAGS: None   WEIGHT BEARING RESTRICTIONS: Yes WBAT in CAM boot and may begin transition into regular shoes  FALLS:  Has patient fallen in last 6 months? No  LIVING ENVIRONMENT: Lives with: lives with their spouse Lives in: House/apartment Stairs: No Has following equipment at home: Single point cane, Environmental consultant - 2 wheeled, Environmental consultant - 4 wheeled, and Wheelchair (manual)  OCCUPATION: retired  PLOF: Independent  PATIENT GOALS: to walk on foot normally without shooting pain up the back.  NEXT MD VISIT: November  OBJECTIVE:  Note: Objective measures were completed at Evaluation unless otherwise noted.  DIAGNOSTIC FINDINGS:  Post surgical XR IMPRESSION: 1. Interval Haglund resection and secondary Achilles tendon repair. 2. Mild talonavicular and navicular-cuneiform osteoarthritis.  PATIENT SURVEYS:  FOTO 52 (goal 59)  COGNITION: Overall cognitive status: Within functional limits for tasks assessed     SENSATION: WFL  EDEMA: edema around post R ankle  POSTURE: flexed trunk   PALPATION: Tender at proximal third of incision and at medial achilles, proximal gastroc  LOWER EXTREMITY ROM:  A/P ROM Right eval Left eval  Hip flexion    Hip extension    Hip abduction    Hip adduction    Hip internal rotation    Hip external rotation    Knee flexion Approximately 90 deg   Knee extension WFL   Ankle dorsiflexion 0/8   Ankle plantarflexion 72   Ankle inversion 27/33   Ankle eversion 20/24    (Blank rows = not tested)  LOWER EXTREMITY MMT:  MMT Right eval Left eval  Hip flexion 4+ 5  Hip extension    Hip abduction    Hip adduction    Hip internal rotation    Hip external rotation    Knee flexion 5 5  Knee extension 5 5  Ankle dorsiflexion 5 5  Ankle plantarflexion NT   Ankle inversion 4 5  Ankle eversion 4 5   (Blank rows = not tested)   FUNCTIONAL TESTS:  5 times sit to stand: 11.2 sec  GAIT: Distance walked: 40 Assistive device utilized: None Level  of assistance: Complete Independence Comments: decreased weight shift R, stance time R, step length L   TODAY'S TREATMENT:        [facility] Adult PT Treatment:                                                DATE: 09/01/2023 Therapeutic Exercise: Seated: BAPS L2 --> ankle DF/PF, EV/INV, circles CW/CCW Heel raises/toe raises Ankle PROM Standing weight shifting --> lateral and fwd (pain) Seated: Ankle ever/inve AROM Marble pick-up Resisted ankle AROM: DF/PF --> PNF D1 & D2   [facility] Adult PT Treatment:  DATE: 08/06/2023 Therapeutic Exercise: Seated heel/toe x20 Seated marble pickups --> marble pick ups with paired toes Walking with focus on rolling through foot Staggered stance weight shifting with focus on great toe push off Resisted side stepping RTB crossed at ankles x5 passes down/up at counter Runner's lunge stretch Manual Therapy: Scar massage along incision --> provided instruction for HEP IASTM  Modalities: Ice pack R ankle x 10 minutes    [facility] Adult PT Treatment:                                                DATE: 08/04/23 Therapeutic Exercise: Seated heel/toe raises 2 x 10  Seated marble pickups 2 x 10  Manual Therapy: Gentle Rt ankle PROM to tolerance Gentle passive Rt gastroc/soleus stretch  STM/trigger point release Rt gastroc/soleus,posterior tib Scar tissue mobilization  Modalities: Ice pack Rt ankle x 10 minutes  Self Care: Energy conservation techniques Ice for pain and swelling as part of home routine Demo use of tennis ball for self-soft tissue mobilization                                                                                                                          PATIENT EDUCATION:  Education details: HEP review   Person educated: Patient Education method: Explanation Education comprehension: verbalized understanding  HOME EXERCISE PROGRAM: Access Code: [email or web address] URL:  [email or web address] Date: 09/01/2023 Prepared by: Carlynn Herald  Exercises - Seated Ankle Circles  - 2 x daily - 7 x weekly - 3 sets - 10 reps - Seated Heel Raise  - 2 x daily - 7 x weekly - 1 sets - 10 reps - Long Sitting Ankle Dorsiflexion AROM  - 2 x daily - 7 x weekly - 1 sets - 10 reps - Seated Ankle Inversion Eversion AROM  - 3 x daily - 7 x weekly - 3 sets - 10 reps - Standing Weight Shift  - 1 x daily - 7 x weekly - 1 sets - 10 reps - Seated Ankle Inversion with Resistance  - 1 x daily - 3-4 x weekly - 1 sets - 10 reps - Seated Ankle Eversion with Resistance  - 1 x daily - 3-4 x weekly - 1 sets - 10 reps - Standing Hip Abduction with Counter Support  - 1 x daily - 7 x weekly - 3 sets - 10 reps - Standing March with Counter Support  - 1 x daily - 7 x weekly - 3 sets - 10 reps - Standing Hip Extension with Counter Support  - 1 x daily - 7 x weekly - 3 sets - 10 reps - Ankle and Toe Plantarflexion with Resistance  - 1 x daily - 7 x weekly - 3 sets - 10 reps - Ankle Dorsiflexion with Resistance  - 1 x daily - 7 x weekly -  3 sets - 10 reps - Long Sitting Ankle PNF D1 Dorsiflexion with Resistance  - 1 x daily - 7 x weekly - 3 sets - 10 reps - Long Sitting Ankle PNF D2 Dorsiflexion with Resistance  - 1 x daily - 7 x weekly - 3 sets - 10 reps  ASSESSMENT:  CLINICAL IMPRESSION: Increased pain with weight shifting in standing; remainder of session focused on seated [place] strengthening exercises. Patient tolerated resisted ankle AROM exercises well with no exacerbation of pain.  Eval: Patient is a 67 y.o. female who was seen today for physical therapy evaluation and treatment for R ankle pain s/p right heel haglunds resection and secondary Achilles tendon repair. She has expected limitations in ROM and strength affecting gait and balance. She has mild edema and some decreased scar tissue mobility with pain at proximal incision and medial heel. She also is limited with standing and  walking due to ongoing R knee pain and has had a TKR and revision. She will benefit from skilled PT to address these deficits.    OBJECTIVE IMPAIRMENTS: Abnormal gait, decreased activity tolerance, decreased balance, decreased ROM, decreased strength, increased edema, increased muscle spasms, impaired flexibility, and pain.   ACTIVITY LIMITATIONS: standing, stairs, and locomotion level  PARTICIPATION LIMITATIONS: meal prep, cleaning, laundry, driving, shopping, and community activity  PERSONAL FACTORS: 1 comorbidity: chronic R knee pain  are also affecting patient's functional outcome.   REHAB POTENTIAL: Excellent  CLINICAL DECISION MAKING: Stable/uncomplicated  EVALUATION COMPLEXITY: Low   GOALS: Goals reviewed with patient? Yes  SHORT TERM GOALS: Target date: 08/12/2023 Patient will be independent with initial HEP. Baseline:  Goal status: MET  2.  Patient to demonstrate equal WB with gait and sit to stands.  Baseline:  Goal status: IN PROGRESS  3.  Patient able to perform SLS for 7 seconds or greater on R LE. Baseline:  Goal status: IN PROGRESS   LONG TERM GOALS: Target date: 09/16/2023  Patient will be independent with advanced/ongoing HEP to improve outcomes and carryover.  Baseline:  Goal status: INITIAL  2.  Patient will report at least 85% improvement in R ankle pain with standing and walking. Baseline:  Goal status: INITIAL  3.  Patient will demonstrate improved functional R ankle AROM to  allow for normal gait and stair mechanics. Baseline:  Goal status: INITIAL  4.  Patient will demonstrate improved R ankle DF to 4+/5 or better. Baseline:  Goal status: INITIAL  5.  Patient will be able to safely ambulate 600' with a normal gait pattern without increased ankle pain to access community.  Baseline:  Goal status: INITIAL  6. Patient will be able to ascend/descend stairs with 1 HR and reciprocal step pattern safely to access home and community.  Baseline:  may be limited by R knee Goal status: INITIAL  7.  Patient will report 6 on FOTO  to demonstrate improved functional ability. Baseline:  Goal status: INITIAL   PLAN:  PT FREQUENCY: 2x/week  PT DURATION: 8 weeks  PLANNED INTERVENTIONS: Therapeutic exercises, Therapeutic activity, Neuromuscular re-education, Balance training, Gait training, Patient/Family education, Self Care, Joint mobilization, Stair training, Aquatic Therapy, Dry Needling, Electrical stimulation, Cryotherapy, Moist heat, Taping, Vasopneumatic device, Ionotophoresis 4mg /ml Dexamethasone, and Manual therapy  PLAN FOR NEXT SESSION: Follow [place] protocol per MD (in file), Focus on ROM, balance and gait to start. Progress out of boot as toelrated. Scar massage, MT prn to lower leg. Progress to light bands per protocol.   Carlynn Herald, PTA 09/01/23 2:00  PM

## 2023-09-03 ENCOUNTER — Ambulatory Visit: Payer: Medicare Other

## 2023-09-05 ENCOUNTER — Ambulatory Visit (HOSPITAL_BASED_OUTPATIENT_CLINIC_OR_DEPARTMENT_OTHER): Payer: Medicare Other | Admitting: Physical Therapy

## 2023-09-08 ENCOUNTER — Ambulatory Visit: Payer: Medicare Other

## 2023-09-09 ENCOUNTER — Ambulatory Visit: Payer: Medicare Other

## 2023-09-09 DIAGNOSIS — R2689 Other abnormalities of gait and mobility: Secondary | ICD-10-CM

## 2023-09-09 DIAGNOSIS — R600 Localized edema: Secondary | ICD-10-CM

## 2023-09-09 DIAGNOSIS — M25571 Pain in right ankle and joints of right foot: Secondary | ICD-10-CM

## 2023-09-09 DIAGNOSIS — M25671 Stiffness of right ankle, not elsewhere classified: Secondary | ICD-10-CM

## 2023-09-09 NOTE — Therapy (Signed)
OUTPATIENT PHYSICAL THERAPY LOWER EXTREMITY TREATMENT/RE-CERTIFICATION      Patient Name: Stephanie [name] MRN: [id number] DOB:03/27/1956, 67 y.o., female Today's Date: 09/09/2023  END OF SESSION:  PT End of Session - 09/09/23 1016     Visit Number 7    Number of Visits 23    Date for PT Re-Evaluation 11/08/23    Authorization Type [employer] MCR    Authorization Time Period 16 visits approved for PT 07/22/2023-09/16/2023    Authorization - Visit Number 7    Authorization - Number of Visits 16    Progress Note Due on Visit 10    PT Start Time 1017    PT Stop Time 1100    PT Time Calculation (min) 43 min    Activity Tolerance Patient tolerated treatment well    Behavior During Therapy [facility] for tasks assessed/performed             Past Medical History:  Diagnosis Date   Diverticulitis 01/21/2013   History of kidney stones    Nephrolithiasis    Past Surgical History:  Procedure Laterality Date   CHOLECYSTECTOMY  10/14/1980   JOINT REPLACEMENT Right 09/28/2020   knee   KNEE ARTHROSCOPY Right 02/2020   MANIPULATION KNEE JOINT Right 12/2020   TONSILECTOMY, ADENOIDECTOMY, BILATERAL MYRINGOTOMY AND TUBES     at age 63 approx.    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   S/P revision of total knee, right 09/24/2021   Trochanteric bursitis of left hip 09/22/2020   Lumbar spinal stenosis 10/04/2019   Primary osteoarthritis of right knee 03/03/2018   Cervical pain (neck) 10/02/2015    Insertional Achilles tendinosis, right leg 09/06/2015   Paresthesia and pain of right extremity 09/06/2015   Splenic artery aneurysm (HCC) 01/21/2013   Diverticulitis of colon without hemorrhage 01/21/2013   COSTOCHONDRITIS 01/01/2011   DYSPNEA ON EXERTION 01/01/2011   BENIGN NEOPLASM OF OTHER SPECIFIED SITES 12/18/2009    PCP: Agapito Games, MD   REFERRING PROVIDER: Louann Sjogren, DPM   REFERRING DIAG:  [phone] (ICD-10-CM) - Post-operative state  M76.61 (ICD-10-CM) - Achilles tendinitis, right leg    THERAPY DIAG:  Stiffness of right ankle, not elsewhere classified  Pain in right ankle and joints of right foot  Other abnormalities of gait and mobility  Localized edema  Rationale for Evaluation and Treatment: Rehabilitation  ONSET DATE: 06/03/23 - Right heel haglunds resection and secondary Achilles tendon repair   SUBJECTIVE:   SUBJECTIVE STATEMENT: Patient reports she was camping and her foot went crazy. She went back to Dr. Ralene Cork and was given a prednisone pack and put back in CAM boot until PT session last week with gradual return to weight-bearing. She transitioned out of the CAM boot last Friday and it feels "pretty good." It is still tender in one spot and can't have a shoe that touches the back of her heel.   Eval: Started wearing tennis shoe 10/3. She was WB for 2 weeks with the boot on. Has 3 lifts  in shoe. Taking out one lift this Thursday and then one per week for 2 weeks after. Does not stand because of R knee and L hip is hurting. Standing is the worst, then walking. Standing limited to 1 min.  PERTINENT HISTORY: R TKR and revision PAIN:  Are you having pain? Yes: NPRS scale: 0.5 currently; 7 at worst/10 Pain location: medial heel Pain description: sharp,sore Aggravating factors: palpation,walking,standing,overactivity Relieving factors: sitting  R knee pain reported at 4/5 constantly up to 5-6/10 with standing and walking.  PRECAUTIONS:  None  RED FLAGS: None   WEIGHT BEARING RESTRICTIONS: Yes WBAT in CAM boot and may begin transition into regular shoes  FALLS:  Has patient fallen in last 6 months? No  LIVING ENVIRONMENT: Lives with: lives with their spouse Lives in: House/apartment Stairs: No Has following equipment at home: Single point cane, Environmental consultant - 2 wheeled, Environmental consultant - 4 wheeled, and Wheelchair (manual)  OCCUPATION: retired  PLOF: Independent  PATIENT GOALS: to walk on foot normally without shooting pain up the back.  NEXT MD VISIT: November  OBJECTIVE:  Note: Objective measures were completed at Evaluation unless otherwise noted.  DIAGNOSTIC FINDINGS:  Post surgical XR IMPRESSION: 1. Interval Haglund resection and secondary Achilles tendon repair. 2. Mild talonavicular and navicular-cuneiform osteoarthritis.  PATIENT SURVEYS:  FOTO 52 (goal 62)  09/09/23: 54% function  COGNITION: Overall cognitive status: Within functional limits for tasks assessed     SENSATION: WFL  EDEMA: edema around post R ankle  POSTURE: flexed trunk   PALPATION: Tender at proximal third of incision and at medial achilles, proximal gastroc  LOWER EXTREMITY ROM:  A/P ROM Right eval Left eval 09/09/23 AROM  Hip flexion     Hip extension     Hip abduction     Hip adduction     Hip internal rotation     Hip external rotation     Knee flexion Approximately 90 deg    Knee extension WFL    Ankle dorsiflexion 0/8  5  Ankle plantarflexion 72  65  Ankle inversion 27/33  20  Ankle eversion 20/24  15 pn   (Blank rows = not tested)  LOWER EXTREMITY MMT:  MMT Right eval Left eval 09/09/23 Right   Hip flexion 4+ 5   Hip extension     Hip abduction     Hip adduction     Hip internal rotation     Hip external rotation     Knee flexion 5 5   Knee extension 5 5   Ankle dorsiflexion 5 5 5   Ankle plantarflexion NT  Unable to perform SL calf raise   Ankle inversion 4 5 5   Ankle eversion 4 5 4+ pn   (Blank  rows = not tested)   FUNCTIONAL TESTS:  5 times sit to stand: 11.2 sec  GAIT: Distance walked: 40 Assistive device utilized: None Level of assistance: Complete Independence Comments: decreased weight shift R, stance time R, step length L   TODAY'S TREATMENT:         [facility] Adult PT Treatment:                                                DATE: 09/09/23 Therapeutic Exercise: Resisted plantarflexion black band 2 x 10  Fwd/bwd weight shifting x 10 each Calf stretch at wall x 30 sec Seated SL  calf raise 10# 2 x 10  Reviewed and updated HEP   Therapeutic Activity: Re-assessment to determine overall progress, educating patient on progress towards goals.    [facility] Adult PT Treatment:                                                DATE: 09/01/2023 Therapeutic Exercise: Seated: BAPS L2 --> ankle DF/PF, EV/INV, circles CW/CCW Heel raises/toe raises Ankle PROM Standing weight shifting --> lateral and fwd (pain) Seated: Ankle ever/inve AROM Marble pick-up Resisted ankle AROM: DF/PF --> PNF D1 & D2   [facility] Adult PT Treatment:                                                DATE: 08/06/2023 Therapeutic Exercise: Seated heel/toe x20 Seated marble pickups --> marble pick ups with paired toes Walking with focus on rolling through foot Staggered stance weight shifting with focus on great toe push off Resisted side stepping RTB crossed at ankles x5 passes down/up at counter Runner's lunge stretch Manual Therapy: Scar massage along incision --> provided instruction for HEP IASTM  Modalities: Ice pack R ankle x 10 minutes    [facility] Adult PT Treatment:                                                DATE: 08/04/23 Therapeutic Exercise: Seated heel/toe raises 2 x 10  Seated marble pickups 2 x 10  Manual Therapy: Gentle Rt ankle PROM to tolerance Gentle passive Rt gastroc/soleus stretch  STM/trigger point release Rt gastroc/soleus,posterior tib Scar tissue mobilization   Modalities: Ice pack Rt ankle x 10 minutes  Self Care: Energy conservation techniques Ice for pain and swelling as part of home routine Demo use of tennis ball for self-soft tissue mobilization                                                                                                                          PATIENT EDUCATION:  Education details: see treatment  Person educated: Patient Education method: Programmer, multimedia, demo, cues, handout Education comprehension: verbalized understanding, returned demo, cues   HOME EXERCISE PROGRAM: Access Code: [email or web address] URL: [email or web address] Date: 09/09/2023 Prepared by: Letitia Libra  Exercises - Standing Weight Shift  - 1 x daily - 7 x weekly - 1 sets - 10 reps - Stride Stance Weight Shift  - 1 x daily - 7 x weekly - 3 sets - 10 reps - Standing Hip Abduction with Counter Support  - 1 x daily - 7 x weekly - 3 sets - 10 reps -  Standing March with Counter Support  - 1 x daily - 7 x weekly - 3 sets - 10 reps - Standing Hip Extension with Counter Support  - 1 x daily - 7 x weekly - 3 sets - 10 reps - Seated Ankle Inversion with Resistance  - 1 x daily - 3-4 x weekly - 1 sets - 10 reps - Seated Ankle Eversion with Resistance  - 1 x daily - 3-4 x weekly - 1 sets - 10 reps - Ankle and Toe Plantarflexion with Resistance  - 1 x daily - 7 x weekly - 3 sets - 10 reps - Ankle Dorsiflexion with Resistance  - 1 x daily - 7 x weekly - 3 sets - 10 reps - Long Sitting Ankle PNF D1 Dorsiflexion with Resistance  - 1 x daily - 7 x weekly - 3 sets - 10 reps - Long Sitting Ankle PNF D2 Dorsiflexion with Resistance  - 1 x daily - 7 x weekly - 3 sets - 10 reps - Gastroc Stretch on Wall  - 1 x daily - 7 x weekly - 3 sets - 30 sec  hold - Seated Calf Raise with Weights on Thighs  - 1 x daily - 7 x weekly - 2 sets - 10 reps  ASSESSMENT:  CLINICAL IMPRESSION: Tamma is making steady progress in PT after recent setback after camping trip where her  pain intensified and was instructed by Dr. Ralene Cork to return to wearing the CAM boot for a short period of time. She began weaning from the boot on Friday and reports that her pain has been minimal, mostly noting pain with direct palpation/pressure to the achilles tendon. She demonstrates normalized Rt ankle AROM with exception of DF as this remains limited. She has plantarflexor and evertor weakness, balance, and gait deficits that are consistent with her prolonged immobilization/surgical intervention. She will benefit from continued skilled PT with addition of aquatic PT to address the above lingering deficits in order to optimize her function and assist in overall pain reduction.   Eval: Patient is a 67 y.o. female who was seen today for physical therapy evaluation and treatment for R ankle pain s/p right heel haglunds resection and secondary Achilles tendon repair. She has expected limitations in ROM and strength affecting gait and balance. She has mild edema and some decreased scar tissue mobility with pain at proximal incision and medial heel. She also is limited with standing and walking due to ongoing R knee pain and has had a TKR and revision. She will benefit from skilled PT to address these deficits.    OBJECTIVE IMPAIRMENTS: Abnormal gait, decreased activity tolerance, decreased balance, decreased ROM, decreased strength, increased edema, increased muscle spasms, impaired flexibility, and pain.   ACTIVITY LIMITATIONS: standing, stairs, and locomotion level  PARTICIPATION LIMITATIONS: meal prep, cleaning, laundry, driving, shopping, and community activity  PERSONAL FACTORS: 1 comorbidity: chronic R knee pain  are also affecting patient's functional outcome.   REHAB POTENTIAL: Excellent  CLINICAL DECISION MAKING: Stable/uncomplicated  EVALUATION COMPLEXITY: Low   GOALS: Goals reviewed with patient? Yes  SHORT TERM GOALS: Target date: 08/12/2023 Patient will be independent with initial  HEP. Baseline:  Goal status: MET  2.  Patient to demonstrate equal WB with gait and sit to stands.  Baseline:  09/09/23: antalgic gait  Goal status: IN PROGRESS  3.  Patient able to perform SLS for 7 seconds or greater on R LE. Baseline:  09/09/23: 7 seconds  Goal status: MET   LONG  TERM GOALS: Target date: 11/08/23 Patient will be independent with advanced/ongoing HEP to improve outcomes and carryover.  Baseline:  Goal status: progressing   2.  Patient will report at least 85% improvement in R ankle pain with standing and walking. Baseline:  09/09/23: 98%  Goal status: MET  3.  Patient will demonstrate improved functional R ankle AROM to  allow for normal gait and stair mechanics. Baseline:  Goal status: partially met   4.  Patient will demonstrate improved R ankle DF to 4+/5 or better. Baseline:  Goal status: MET  5.  Patient will be able to safely ambulate 600' with a normal gait pattern without increased ankle pain to access community.  Baseline:  09/09/23: antalgic RLE  Goal status: progressing   6. Patient will be able to ascend/descend stairs with 1 HR and reciprocal step pattern safely to access home and community.  Baseline: may be limited by R knee 09/09/23: step to pattern mostly due to the knee, but does endorse pain the ankle  Goal status: progressing   7.  Patient will report 34 on FOTO  to demonstrate improved functional ability. Baseline:  Goal status: progressing    PLAN:  PT FREQUENCY: 2x/week  PT DURATION: 8 weeks  PLANNED INTERVENTIONS: Therapeutic exercises, Therapeutic activity, Neuromuscular re-education, Balance training, Gait training, Patient/Family education, Self Care, Joint mobilization, Stair training, Aquatic Therapy, Dry Needling, Electrical stimulation, Cryotherapy, Moist heat, Taping, Vasopneumatic device, Ionotophoresis 4mg /ml Dexamethasone, and Manual therapy  PLAN FOR NEXT SESSION: Follow [place] protocol per MD (in file),  Focus on normalizing gait mechanics, static balance activity, calf stretching and strengthening.    Letitia Libra, PT, DPT, ATC 09/09/23 11:11 AM

## 2023-09-10 ENCOUNTER — Encounter (HOSPITAL_BASED_OUTPATIENT_CLINIC_OR_DEPARTMENT_OTHER): Payer: Self-pay | Admitting: Physical Therapy

## 2023-09-10 ENCOUNTER — Ambulatory Visit (HOSPITAL_BASED_OUTPATIENT_CLINIC_OR_DEPARTMENT_OTHER): Payer: Medicare Other | Attending: Podiatry | Admitting: Physical Therapy

## 2023-09-10 DIAGNOSIS — M7661 Achilles tendinitis, right leg: Secondary | ICD-10-CM | POA: Diagnosis not present

## 2023-09-10 DIAGNOSIS — M25571 Pain in right ankle and joints of right foot: Secondary | ICD-10-CM | POA: Diagnosis not present

## 2023-09-10 DIAGNOSIS — R2689 Other abnormalities of gait and mobility: Secondary | ICD-10-CM

## 2023-09-10 DIAGNOSIS — M25671 Stiffness of right ankle, not elsewhere classified: Secondary | ICD-10-CM

## 2023-09-10 DIAGNOSIS — R600 Localized edema: Secondary | ICD-10-CM | POA: Diagnosis not present

## 2023-09-10 DIAGNOSIS — R269 Unspecified abnormalities of gait and mobility: Secondary | ICD-10-CM | POA: Insufficient documentation

## 2023-09-10 DIAGNOSIS — Z9889 Other specified postprocedural states: Secondary | ICD-10-CM | POA: Insufficient documentation

## 2023-09-10 NOTE — Therapy (Signed)
OUTPATIENT PHYSICAL THERAPY LOWER EXTREMITY TREATMENT     Patient Name: Stephanie [name] MRN: [id number] DOB:08-26-1956, 67 y.o., female Today's Date: 09/10/2023  END OF SESSION:  PT End of Session - 09/10/23 0819     Visit Number 8    Number of Visits 23    Date for PT Re-Evaluation 11/08/23    Authorization Type [employer] MCR    Authorization Time Period 16 visits approved for PT 07/22/2023-09/16/2023    Authorization - Visit Number 8    Authorization - Number of Visits 16    Progress Note Due on Visit 10    PT Start Time 0815    PT Stop Time 0905    PT Time Calculation (min) 50 min    Activity Tolerance Patient tolerated treatment well    Behavior During Therapy [facility] for tasks assessed/performed             Past Medical History:  Diagnosis Date   Diverticulitis 01/21/2013   History of kidney stones    Nephrolithiasis    Past Surgical History:  Procedure Laterality Date   CHOLECYSTECTOMY  10/14/1980   JOINT REPLACEMENT Right 09/28/2020   knee   KNEE ARTHROSCOPY Right 02/2020   MANIPULATION KNEE JOINT Right 12/2020   TONSILECTOMY, ADENOIDECTOMY, BILATERAL MYRINGOTOMY AND TUBES     at age 66 approx.    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   S/P revision of total knee, right 09/24/2021   Trochanteric bursitis of left hip 09/22/2020   Lumbar spinal stenosis 10/04/2019   Primary osteoarthritis of right knee 03/03/2018   Cervical pain (neck) 10/02/2015   Insertional  Achilles tendinosis, right leg 09/06/2015   Paresthesia and pain of right extremity 09/06/2015   Splenic artery aneurysm (HCC) 01/21/2013   Diverticulitis of colon without hemorrhage 01/21/2013   COSTOCHONDRITIS 01/01/2011   DYSPNEA ON EXERTION 01/01/2011   BENIGN NEOPLASM OF OTHER SPECIFIED SITES 12/18/2009    PCP: Agapito Games, MD   REFERRING PROVIDER: Louann Sjogren, DPM   REFERRING DIAG:  [phone] (ICD-10-CM) - Post-operative state  M76.61 (ICD-10-CM) - Achilles tendinitis, right leg    THERAPY DIAG:  Stiffness of right ankle, not elsewhere classified  Pain in right ankle and joints of right foot  Other abnormalities of gait and mobility  Localized edema  Rationale for Evaluation and Treatment: Rehabilitation  ONSET DATE: 06/03/23 - Right heel haglunds resection and secondary Achilles tendon repair   SUBJECTIVE:   SUBJECTIVE STATEMENT: Patient reports she did ok after last land session, "I took Ibuprofen afterwards".  Pt reports she is a member of [employer].      Eval: Started wearing tennis shoe 10/3. She was WB for 2 weeks with the boot on. Has 3 lifts in shoe. Taking out one lift this Thursday and then one per week for 2 weeks after. Does not stand because of R knee and L hip is hurting. Standing is the worst, then walking. Standing limited to 1 min.  PERTINENT HISTORY: R TKR and  revision PAIN:  Are you having pain? Yes: NPRS scale: 1/10 currently;  Pain location: medial R heel Pain description: sharp,sore Aggravating factors: palpation,walking,standing,overactivity Relieving factors: sitting  R knee pain reported at 4/5 constantly up to 5-6/10 with standing and walking.  PRECAUTIONS: None  RED FLAGS: None   WEIGHT BEARING RESTRICTIONS: Yes WBAT in CAM boot and may begin transition into regular shoes  FALLS:  Has patient fallen in last 6 months? No  LIVING ENVIRONMENT: Lives with: lives with their spouse Lives in:  House/apartment Stairs: No Has following equipment at home: Single point cane, Environmental consultant - 2 wheeled, Environmental consultant - 4 wheeled, and Wheelchair (manual)  OCCUPATION: retired  PLOF: Independent  PATIENT GOALS: to walk on foot normally without shooting pain up the back.  NEXT MD VISIT: November  OBJECTIVE:  Note: Objective measures were completed at Evaluation unless otherwise noted.  DIAGNOSTIC FINDINGS:  Post surgical XR IMPRESSION: 1. Interval Haglund resection and secondary Achilles tendon repair. 2. Mild talonavicular and navicular-cuneiform osteoarthritis.  PATIENT SURVEYS:  FOTO 52 (goal 62)  09/09/23: 54% function  COGNITION: Overall cognitive status: Within functional limits for tasks assessed     SENSATION: WFL  EDEMA: edema around post R ankle  POSTURE: flexed trunk   PALPATION: Tender at proximal third of incision and at medial achilles, proximal gastroc  LOWER EXTREMITY ROM:  A/P ROM Right eval Left eval 09/09/23 AROM  Hip flexion     Hip extension     Hip abduction     Hip adduction     Hip internal rotation     Hip external rotation     Knee flexion Approximately 90 deg    Knee extension WFL    Ankle dorsiflexion 0/8  5  Ankle plantarflexion 72  65  Ankle inversion 27/33  20  Ankle eversion 20/24  15 pn   (Blank rows = not tested)  LOWER EXTREMITY MMT:  MMT Right eval Left eval 09/09/23 Right   Hip flexion 4+ 5   Hip extension     Hip abduction     Hip adduction     Hip internal rotation     Hip external rotation     Knee flexion 5 5   Knee extension 5 5   Ankle dorsiflexion 5 5 5   Ankle plantarflexion NT  Unable to perform SL calf raise   Ankle inversion 4 5 5   Ankle eversion 4 5 4+ pn   (Blank rows = not tested)   FUNCTIONAL TESTS:  5 times sit to stand: 11.2 sec  GAIT: Distance walked: 40 Assistive device utilized: None Level of assistance: Complete Independence Comments: decreased weight shift R, stance time R, step length  L   TODAY'S TREATMENT:        [facility] Adult PT Treatment:                                                DATE: 09/09/23 Pt seen for aquatic therapy today.  Treatment took place in water 3.5-4.75 ft in depth at the [facility] pool. Temp of water was 91.  Pt entered/exited the pool via stairs in step-to pattern with bilat rail. * intro to aquatic therapy principles/ properties * unsupport in 22ft 6" water: walking forward/ backward -> repeated with UE on barbell - cues for heel/toe and toe/heel, and even step length- multiple laps *  holding wall:  partial heel toe raises x 10;  hip abdct/ addct 3 x 5; hip flex/ext x 8 each * return to walking forward / backward 1 lap with barbell- improved tolerance * straddling noodle holding corner of deeper water/ yellow hand floats : cycling; hip abdct/ addct with DF; cross country ski LEs * return to walking forward/ backward with yellow hand floats under water  * UE on rainbow floats:  side stepping x 2 laps with cues for relaxed feet and knees (painful) * holding wall side step L/R * at 21ft: trial of R SLS x 5 sec (no pain) and R heel raise x 2  * after dried off:  applied reg Rock tape to medial R ankle (covering tender spot next to Achilles) wrapping under calcaneus to lateral ankle.  Perpendicular strip applied across back of calcaneus with 40% stretch to decompress tissue, decrease sensitivity and increase proprioception. Pt educated in tape rationale and safe tape removal technique; verbalized understanding.    [facility] Adult PT Treatment:                                                DATE: 09/09/23 Therapeutic Exercise: Resisted plantarflexion black band 2 x 10  Fwd/bwd weight shifting x 10 each Calf stretch at wall x 30 sec Seated SL calf raise 10# 2 x 10  Reviewed and updated HEP   Therapeutic Activity: Re-assessment to determine overall progress, educating patient on progress towards goals.    [facility] Adult PT Treatment:                                                 DATE: 09/01/2023 Therapeutic Exercise: Seated: BAPS L2 --> ankle DF/PF, EV/INV, circles CW/CCW Heel raises/toe raises Ankle PROM Standing weight shifting --> lateral and fwd (pain) Seated: Ankle ever/inve AROM Marble pick-up Resisted ankle AROM: DF/PF --> PNF D1 & D2   [facility] Adult PT Treatment:                                                DATE: 08/06/2023 Therapeutic Exercise: Seated heel/toe x20 Seated marble pickups --> marble pick ups with paired toes Walking with focus on rolling through foot Staggered stance weight shifting with focus on great toe push off Resisted side stepping RTB crossed at ankles x5 passes down/up at counter Runner's lunge stretch Manual Therapy: Scar massage along incision --> provided instruction for HEP IASTM  Modalities: Ice pack R ankle x 10 minutes    [facility] Adult PT Treatment:                                                DATE: 08/04/23 Therapeutic Exercise: Seated heel/toe raises 2 x 10  Seated marble pickups 2 x 10  Manual Therapy: Gentle Rt ankle PROM to tolerance Gentle passive Rt gastroc/soleus stretch  STM/trigger point release Rt gastroc/soleus,posterior tib Scar tissue mobilization  Modalities: Ice pack Rt ankle x 10 minutes  Self Care: Energy conservation techniques Ice for pain and swelling as part of home routine Demo use of tennis ball for self-soft tissue mobilization                                                                                                                          PATIENT EDUCATION:  Education details: see treatment  Person educated: Patient Education method: Explanation, demo, cues, handout Education comprehension: verbalized understanding, returned demo, cues   HOME EXERCISE PROGRAM: Access Code: [email or web address] URL: [email or web address] Date: 09/09/2023 Prepared by: Letitia Libra  Exercises - Standing Weight Shift  - 1 x daily - 7 x weekly - 1 sets -  10 reps - Stride Stance Weight Shift  - 1 x daily - 7 x weekly - 3 sets - 10 reps - Standing Hip Abduction with Counter Support  - 1 x daily - 7 x weekly - 3 sets - 10 reps - Standing March with Counter Support  - 1 x daily - 7 x weekly - 3 sets - 10 reps - Standing Hip Extension with Counter Support  - 1 x daily - 7 x weekly - 3 sets - 10 reps - Seated Ankle Inversion with Resistance  - 1 x daily - 3-4 x weekly - 1 sets - 10 reps - Seated Ankle Eversion with Resistance  - 1 x daily - 3-4 x weekly - 1 sets - 10 reps - Ankle and Toe Plantarflexion with Resistance  - 1 x daily - 7 x weekly - 3 sets - 10 reps - Ankle Dorsiflexion with Resistance  - 1 x daily - 7 x weekly - 3 sets - 10 reps - Long Sitting Ankle PNF D1 Dorsiflexion with Resistance  - 1 x daily - 7 x weekly - 3 sets - 10 reps - Long Sitting Ankle PNF D2 Dorsiflexion with Resistance  - 1 x daily - 7 x weekly - 3 sets - 10 reps - Gastroc Stretch on Wall  - 1 x daily - 7 x weekly - 3 sets - 30 sec  hold - Seated Calf Raise with Weights on Thighs  - 1 x daily - 7 x weekly - 2 sets - 10 reps  ASSESSMENT:  CLINICAL IMPRESSION: Pt is confident in aquatic setting and able to take direction from therapist on deck.  She reported slight increase in Rt ankle and R knee pain (2/10) with initial gait;  improved with time during session. She requires cues for more even step length.   She was pair free with suspended cycling in deeper water.  Discussed exercises she can do at pool until aquatic HEP is built; verbalized understanding.  She has plantarflexor and evertor weakness, balance, and gait deficits that are consistent with her prolonged immobilization/surgical intervention. She will benefit from continued skilled PT with addition of aquatic PT to address the above lingering deficits in order to optimize her function and assist in overall pain reduction. Pt is progressing gradually towards  remaining goals.  Eval: Patient is a 67 y.o. female who  was seen today for physical therapy evaluation and treatment for R ankle pain s/p right heel haglunds resection and secondary Achilles tendon repair. She has expected limitations in ROM and strength affecting gait and balance. She has mild edema and some decreased scar tissue mobility with pain at proximal incision and medial heel. She also is limited with standing and walking due to ongoing R knee pain and has had a TKR and revision. She will benefit from skilled PT to address these deficits.    OBJECTIVE IMPAIRMENTS: Abnormal gait, decreased activity tolerance, decreased balance, decreased ROM, decreased strength, increased edema, increased muscle spasms, impaired flexibility, and pain.   ACTIVITY LIMITATIONS: standing, stairs, and locomotion level  PARTICIPATION LIMITATIONS: meal prep, cleaning, laundry, driving, shopping, and community activity  PERSONAL FACTORS: 1 comorbidity: chronic R knee pain  are also affecting patient's functional outcome.   REHAB POTENTIAL: Excellent  CLINICAL DECISION MAKING: Stable/uncomplicated  EVALUATION COMPLEXITY: Low   GOALS: Goals reviewed with patient? Yes  SHORT TERM GOALS: Target date: 08/12/2023 Patient will be independent with initial HEP. Baseline:  Goal status: MET  2.  Patient to demonstrate equal WB with gait and sit to stands.  Baseline:  09/09/23: antalgic gait  Goal status: IN PROGRESS  3.  Patient able to perform SLS for 7 seconds or greater on R LE. Baseline:  09/09/23: 7 seconds  Goal status: MET   LONG TERM GOALS: Target date: 11/08/23 Patient will be independent with advanced/ongoing HEP to improve outcomes and carryover.  Baseline:  Goal status: progressing   2.  Patient will report at least 85% improvement in R ankle pain with standing and walking. Baseline:  09/09/23: 98%  Goal status: MET  3.  Patient will demonstrate improved functional R ankle AROM to  allow for normal gait and stair mechanics. Baseline:  Goal  status: partially met   4.  Patient will demonstrate improved R ankle DF to 4+/5 or better. Baseline:  Goal status: MET  5.  Patient will be able to safely ambulate 600' with a normal gait pattern without increased ankle pain to access community.  Baseline:  09/09/23: antalgic RLE  Goal status: progressing   6. Patient will be able to ascend/descend stairs with 1 HR and reciprocal step pattern safely to access home and community.  Baseline: may be limited by R knee 09/09/23: step to pattern mostly due to the knee, but does endorse pain the ankle  Goal status: progressing   7.  Patient will report 69 on FOTO  to demonstrate improved functional ability. Baseline:  Goal status: progressing    PLAN:  PT FREQUENCY: 2x/week  PT DURATION: 8 weeks  PLANNED INTERVENTIONS: Therapeutic exercises, Therapeutic activity, Neuromuscular re-education, Balance training, Gait training, Patient/Family education, Self Care, Joint mobilization, Stair training, Aquatic Therapy, Dry Needling, Electrical stimulation, Cryotherapy, Moist heat, Taping, Vasopneumatic device, Ionotophoresis 4mg /ml Dexamethasone, and Manual therapy  PLAN FOR NEXT SESSION: Follow [place] protocol per MD (in file), Focus on normalizing gait mechanics, static balance activity, calf stretching and strengthening.

## 2023-09-15 ENCOUNTER — Ambulatory Visit: Payer: Medicare Other | Attending: Podiatry

## 2023-09-15 DIAGNOSIS — M25671 Stiffness of right ankle, not elsewhere classified: Secondary | ICD-10-CM | POA: Diagnosis not present

## 2023-09-15 DIAGNOSIS — R600 Localized edema: Secondary | ICD-10-CM | POA: Insufficient documentation

## 2023-09-15 DIAGNOSIS — R2689 Other abnormalities of gait and mobility: Secondary | ICD-10-CM | POA: Diagnosis not present

## 2023-09-15 DIAGNOSIS — M25571 Pain in right ankle and joints of right foot: Secondary | ICD-10-CM | POA: Insufficient documentation

## 2023-09-15 NOTE — Therapy (Signed)
OUTPATIENT PHYSICAL THERAPY LOWER EXTREMITY TREATMENT Progress Note Reporting Period 07/22/23 to 09/15/23  See note below for Objective Data and Assessment of Progress/Goals.        Patient Name: Stephanie [name] MRN: [id number] DOB:1956-03-21, 67 y.o., female Today's Date: 09/15/2023  END OF SESSION:  PT End of Session - 09/15/23 0934     Visit Number 9    Number of Visits 23    Date for PT Re-Evaluation 11/08/23    Authorization Type [employer] MCR    Authorization Time Period 16 visits approved for PT 07/22/2023-09/16/2023    Authorization - Visit Number 9    Authorization - Number of Visits 16    Progress Note Due on Visit 19    PT Start Time 0934    PT Stop Time 1016    PT Time Calculation (min) 42 min    Activity Tolerance Patient tolerated treatment well    Behavior During Therapy [facility] for tasks assessed/performed              Past Medical History:  Diagnosis Date   Diverticulitis 01/21/2013   History of kidney stones    Nephrolithiasis    Past Surgical History:  Procedure Laterality Date   CHOLECYSTECTOMY  10/14/1980   JOINT REPLACEMENT Right 09/28/2020   knee   KNEE ARTHROSCOPY Right 02/2020   MANIPULATION KNEE JOINT Right 12/2020   TONSILECTOMY, ADENOIDECTOMY, BILATERAL MYRINGOTOMY AND TUBES     at age 56 approx.    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   S/P revision of total knee, right 09/24/2021   Trochanteric bursitis of left hip 09/22/2020   Lumbar  spinal stenosis 10/04/2019   Primary osteoarthritis of right knee 03/03/2018   Cervical pain (neck) 10/02/2015   Insertional Achilles tendinosis, right leg 09/06/2015   Paresthesia and pain of right extremity 09/06/2015   Splenic artery aneurysm (HCC) 01/21/2013   Diverticulitis of colon without hemorrhage 01/21/2013   COSTOCHONDRITIS 01/01/2011   DYSPNEA ON EXERTION 01/01/2011   BENIGN NEOPLASM OF OTHER SPECIFIED SITES 12/18/2009    PCP: Agapito Games, MD   REFERRING PROVIDER: Louann Sjogren, DPM   REFERRING DIAG:  [id number] (ICD-10-CM) - Post-operative state  M76.61 (ICD-10-CM) - Achilles tendinitis, right leg    THERAPY DIAG:  Stiffness of right ankle, not elsewhere classified  Pain in right ankle and joints of right foot  Other abnormalities of gait and mobility  Localized edema  Rationale for Evaluation and Treatment: Rehabilitation  ONSET DATE: 06/03/23 - Right heel haglunds resection and secondary Achilles tendon repair   SUBJECTIVE:   SUBJECTIVE STATEMENT: "Pretty good. A little tight"    Eval: Started wearing tennis shoe 10/3. She was WB for 2 weeks with the boot on. Has 3 lifts in shoe. Taking out one lift this Thursday and then one per week for 2 weeks after. Does not stand because of R knee and L hip is hurting. Standing is the worst, then walking. Standing limited to 1 min.  PERTINENT HISTORY: R  TKR and revision PAIN:  Are you having pain? Yes: NPRS scale: 1/10 currently;  Pain location: medial R heel Pain description: sharp,sore Aggravating factors: palpation,walking,standing,overactivity Relieving factors: sitting  R knee pain reported at 4/5 constantly up to 5-6/10 with standing and walking.  PRECAUTIONS: None  RED FLAGS: None   WEIGHT BEARING RESTRICTIONS: Yes WBAT in CAM boot and may begin transition into regular shoes  FALLS:  Has patient fallen in last 6 months? No  LIVING ENVIRONMENT: Lives with: lives with their  spouse Lives in: House/apartment Stairs: No Has following equipment at home: Single point cane, Environmental consultant - 2 wheeled, Environmental consultant - 4 wheeled, and Wheelchair (manual)  OCCUPATION: retired  PLOF: Independent  PATIENT GOALS: to walk on foot normally without shooting pain up the back.  NEXT MD VISIT: November  OBJECTIVE:  Note: Objective measures were completed at Evaluation unless otherwise noted.  DIAGNOSTIC FINDINGS:  Post surgical XR IMPRESSION: 1. Interval Haglund resection and secondary Achilles tendon repair. 2. Mild talonavicular and navicular-cuneiform osteoarthritis.  PATIENT SURVEYS:  FOTO 52 (goal 62)  09/09/23: 54% function  09/15/23: 52% function  COGNITION: Overall cognitive status: Within functional limits for tasks assessed     SENSATION: WFL  EDEMA: edema around post R ankle  POSTURE: flexed trunk   PALPATION: Tender at proximal third of incision and at medial achilles, proximal gastroc  LOWER EXTREMITY ROM:  A/P ROM Right eval Left eval 09/09/23 AROM 09/15/23 AROM  Hip flexion      Hip extension      Hip abduction      Hip adduction      Hip internal rotation      Hip external rotation      Knee flexion Approximately 90 deg     Knee extension WFL     Ankle dorsiflexion 0/8  5 5   Ankle plantarflexion 72  65   Ankle inversion 27/33  20   Ankle eversion 20/24  15 pn    (Blank rows = not tested)  LOWER EXTREMITY MMT:  MMT Right eval Left eval 09/09/23 Right   Hip flexion 4+ 5   Hip extension     Hip abduction     Hip adduction     Hip internal rotation     Hip external rotation     Knee flexion 5 5   Knee extension 5 5   Ankle dorsiflexion 5 5 5   Ankle plantarflexion NT  Unable to perform SL calf raise   Ankle inversion 4 5 5   Ankle eversion 4 5 4+ pn   (Blank rows = not tested)   FUNCTIONAL TESTS:  5 times sit to stand: 11.2 sec  GAIT: Distance walked: 40 Assistive device utilized: None Level of assistance: Complete  Independence Comments: decreased weight shift R, stance time R, step length L   TODAY'S TREATMENT:         [facility] Adult PT Treatment:                                                DATE: 09/15/23 Therapeutic Exercise: NuStep level 5 x 5 minutes UE/LE  Calf stretch at wedge x 1 minute, unable to stretch soleus  MWM with black theraband for dorsiflexion x 10 Forward step overs black yoga block x 10 each  Lateral step overs black yoga block x 10  SL rockerboard A/P  seated 2 x 10 each  Rockerboard lateral x 10  Updated HEP   Therapeutic Activity: Re-assessment of goals, educating patient on overall progress    [facility] Adult PT Treatment:                                                DATE: 09/09/23 Pt seen for aquatic therapy today.  Treatment took place in water 3.5-4.75 ft in depth at the [facility] pool. Temp of water was 91.  Pt entered/exited the pool via stairs in step-to pattern with bilat rail. * intro to aquatic therapy principles/ properties * unsupport in 57ft 6" water: walking forward/ backward -> repeated with UE on barbell - cues for heel/toe and toe/heel, and even step length- multiple laps * holding wall:  partial heel toe raises x 10;  hip abdct/ addct 3 x 5; hip flex/ext x 8 each * return to walking forward / backward 1 lap with barbell- improved tolerance * straddling noodle holding corner of deeper water/ yellow hand floats : cycling; hip abdct/ addct with DF; cross country ski LEs * return to walking forward/ backward with yellow hand floats under water  * UE on rainbow floats:  side stepping x 2 laps with cues for relaxed feet and knees (painful) * holding wall side step L/R * at 52ft: trial of R SLS x 5 sec (no pain) and R heel raise x 2  * after dried off:  applied reg Rock tape to medial R ankle (covering tender spot next to Achilles) wrapping under calcaneus to lateral ankle.  Perpendicular strip applied across back of calcaneus with 40% stretch to decompress  tissue, decrease sensitivity and increase proprioception. Pt educated in tape rationale and safe tape removal technique; verbalized understanding.    [facility] Adult PT Treatment:                                                DATE: 09/09/23 Therapeutic Exercise: Resisted plantarflexion black band 2 x 10  Fwd/bwd weight shifting x 10 each Calf stretch at wall x 30 sec Seated SL calf raise 10# 2 x 10  Reviewed and updated HEP   Therapeutic Activity: Re-assessment to determine overall progress, educating patient on progress towards goals.   PATIENT EDUCATION:  Education details: MWM picture printout; see treatment  Person educated: Patient Education method: Explanation, demo, cues, handout Education comprehension: verbalized understanding, returned demo, cues   HOME EXERCISE PROGRAM: Access Code: [email or web address] URL: [email or web address] Date: 09/09/2023 Prepared by: Letitia Libra  Exercises - Standing Weight Shift  - 1 x daily - 7 x weekly - 1 sets - 10 reps - Stride Stance Weight Shift  - 1 x daily - 7 x weekly - 3 sets - 10 reps - Standing Hip Abduction with Counter Support  - 1 x daily - 7 x weekly - 3 sets - 10 reps - Standing March with Counter Support  - 1 x daily - 7 x weekly - 3 sets - 10 reps - Standing Hip Extension with Counter Support  - 1 x daily - 7 x weekly - 3 sets - 10 reps - Seated Ankle Inversion with Resistance  - 1 x daily - 3-4 x weekly - 1  sets - 10 reps - Seated Ankle Eversion with Resistance  - 1 x daily - 3-4 x weekly - 1 sets - 10 reps - Ankle and Toe Plantarflexion with Resistance  - 1 x daily - 7 x weekly - 3 sets - 10 reps - Ankle Dorsiflexion with Resistance  - 1 x daily - 7 x weekly - 3 sets - 10 reps - Long Sitting Ankle PNF D1 Dorsiflexion with Resistance  - 1 x daily - 7 x weekly - 3 sets - 10 reps - Long Sitting Ankle PNF D2 Dorsiflexion with Resistance  - 1 x daily - 7 x weekly - 3 sets - 10 reps - Gastroc Stretch on Wall  - 1 x daily - 7 x  weekly - 3 sets - 30 sec  hold - Seated Calf Raise with Weights on Thighs  - 1 x daily - 7 x weekly - 2 sets - 10 reps  ASSESSMENT:  CLINICAL IMPRESSION: Cailyn is making steady progress in PT after recent setback when she instructed by Dr. Ralene Cork to return to wearing the CAM boot for a short period of time. She has been out of the CAM boot for a little over a week now reporting minimal pain. She began aquatic PT last week, which patient feels was helpful in improving her gait and had improved tolerance to weightbearing activity. She is making good progress towards established goals with limitations remaining in ankle DF AROM, plantarflexor/evertor weakness, and balance/gait deficits. She will benefit from continuing with current POC, including aquatics to address the above lingering deficits in order to optimize her function and assist in overall pain reduction.   Eval: Patient is a 67 y.o. female who was seen today for physical therapy evaluation and treatment for R ankle pain s/p right heel haglunds resection and secondary Achilles tendon repair. She has expected limitations in ROM and strength affecting gait and balance. She has mild edema and some decreased scar tissue mobility with pain at proximal incision and medial heel. She also is limited with standing and walking due to ongoing R knee pain and has had a TKR and revision. She will benefit from skilled PT to address these deficits.    OBJECTIVE IMPAIRMENTS: Abnormal gait, decreased activity tolerance, decreased balance, decreased ROM, decreased strength, increased edema, increased muscle spasms, impaired flexibility, and pain.   ACTIVITY LIMITATIONS: standing, stairs, and locomotion level  PARTICIPATION LIMITATIONS: meal prep, cleaning, laundry, driving, shopping, and community activity  PERSONAL FACTORS: 1 comorbidity: chronic R knee pain  are also affecting patient's functional outcome.   REHAB POTENTIAL: Excellent  CLINICAL DECISION  MAKING: Stable/uncomplicated  EVALUATION COMPLEXITY: Low   GOALS: Goals reviewed with patient? Yes  SHORT TERM GOALS: Target date: 08/12/2023 Patient will be independent with initial HEP. Baseline:  Goal status: MET  2.  Patient to demonstrate equal WB with gait and sit to stands.  Baseline:  09/09/23: antalgic gait  09/15/23: antalgic gait  Goal status: IN PROGRESS  3.  Patient able to perform SLS for 7 seconds or greater on R LE. Baseline:  09/09/23: 7 seconds  Goal status: MET   LONG TERM GOALS: Target date: 11/08/23 Patient will be independent with advanced/ongoing HEP to improve outcomes and carryover.  Baseline:  Goal status: progressing   2.  Patient will report at least 85% improvement in R ankle pain with standing and walking. Baseline:  09/09/23: 98%  Goal status: MET  3.  Patient will demonstrate improved functional R ankle AROM to  allow for normal gait and stair mechanics. Baseline:  Goal status: partially met   4.  Patient will demonstrate improved R ankle DF to 4+/5 or better. Baseline:  Goal status: MET  5.  Patient will be able to safely ambulate 600' with a normal gait pattern without increased ankle pain to access community.  Baseline:  09/09/23: antalgic RLE  09/15/23: antalgic RLE Goal status: progressing   6. Patient will be able to ascend/descend stairs with 1 HR and reciprocal step pattern safely to access home and community.  Baseline: may be limited by R knee 09/09/23: step to pattern mostly due to the knee, but does endorse pain the ankle  09/15/23: step to pattern  Goal status: progressing   7.  Patient will report 56 on FOTO  to demonstrate improved functional ability. Baseline:  Goal status: progressing    PLAN:  PT FREQUENCY: 2x/week  PT DURATION: 8 weeks  PLANNED INTERVENTIONS: Therapeutic exercises, Therapeutic activity, Neuromuscular re-education, Balance training, Gait training, Patient/Family education, Self Care, Joint  mobilization, Stair training, Aquatic Therapy, Dry Needling, Electrical stimulation, Cryotherapy, Moist heat, Taping, Vasopneumatic device, Ionotophoresis 4mg /ml Dexamethasone, and Manual therapy  PLAN FOR NEXT SESSION: Follow [place] protocol per MD (in file), Focus on normalizing gait mechanics, static balance activity, calf stretching and strengthening.   Letitia Libra, PT, DPT, ATC 09/15/23 12:48 PM

## 2023-09-17 ENCOUNTER — Encounter (HOSPITAL_BASED_OUTPATIENT_CLINIC_OR_DEPARTMENT_OTHER): Payer: Self-pay

## 2023-09-17 ENCOUNTER — Ambulatory Visit: Payer: Medicare Other

## 2023-09-17 ENCOUNTER — Ambulatory Visit (HOSPITAL_BASED_OUTPATIENT_CLINIC_OR_DEPARTMENT_OTHER): Payer: Medicare Other | Admitting: Physical Therapy

## 2023-09-18 ENCOUNTER — Ambulatory Visit: Payer: Medicare Other | Admitting: Podiatry

## 2023-09-18 DIAGNOSIS — M7661 Achilles tendinitis, right leg: Secondary | ICD-10-CM

## 2023-09-18 DIAGNOSIS — Z9889 Other specified postprocedural states: Secondary | ICD-10-CM

## 2023-09-18 NOTE — Progress Notes (Signed)
  Subjective:  Patient ID: Stephanie [name], female    DOB: 12-31-1955,  MRN: [id number]  No chief complaint on file.   DOS: 06/03/23 Procedure: Right heel haglunds resection and secondary Achilles tendon repair   67 y.o. female returns for POV#7. Relates doing better today. Has been getting less pain and slowly improving with aquatic PT.   Review of Systems: Negative except as noted in the HPI. Denies N/V/F/Ch.  Past Medical History:  Diagnosis Date   Diverticulitis 01/21/2013   History of kidney stones    Nephrolithiasis     Current Outpatient Medications:    cephALEXin (KEFLEX) 500 MG capsule, Take 1 capsule (500 mg total) by mouth 4 (four) times daily. (Patient not taking: Reported on 07/22/2023), Disp: 28 capsule, Rfl: 0   ondansetron (ZOFRAN) 4 MG tablet, Take 1 tablet (4 mg total) by mouth every 8 (eight) hours as needed for nausea or vomiting. (Patient not taking: Reported on 07/22/2023), Disp: 20 tablet, Rfl: 0   methylPREDNISolone (MEDROL DOSEPAK) 4 MG TBPK tablet, Take as directed, Disp: 21 tablet, Rfl: 0   nitroGLYCERIN (NITRODUR - DOSED IN MG/24 HR) 0.2 mg/hr patch, Cut and apply 1/4 patch to most painful area q24h. (Patient not taking: Reported on 07/22/2023), Disp: 30 patch, Rfl: 11  Social History   Tobacco Use  Smoking Status Former   Current packs/day: 0.00   Average packs/day: 0.2 packs/day for 2.0 years (0.4 ttl pk-yrs)   Types: Cigarettes   Start date: 10/14/1974   Quit date: 10/14/1976   Years since quitting: 46.9  Smokeless Tobacco Never    Allergies  Allergen Reactions   Duloxetine Nausea Only   Objective:  There were no vitals filed for this visit. There is no height or weight on file to calculate BMI. Constitutional Well developed. Well nourished.  Vascular Foot warm and well perfused. Capillary refill normal to all digits.   Neurologic Normal speech. Oriented to person, place, and time. Epicritic sensation to light touch grossly present bilaterally.   Dermatologic Skin healing well without signs of infection. Skin edges well coapted without signs of infection. Achilles and surgical repair appear to still be intact.   Orthopedic: Tenderness to palpation noted about the surgical site.   Radiographs: Interval resection of achilles haglund deformity and previous spurs Assessment:   1. Post-operative state   2. Insertional Achilles tendinosis, right leg        Plan:  Patient was evaluated and treated and all questions answered.  S/p foot surgery right -Progressing as expected post-operatively. -WB Status: WBAT in regular shoes.  Continue PT  -Will hold off on PRP as long as PT helping.  -Medications: Medrol dose pack to be taken as needed for any continued pain.   Patient return in 3 months for recheck. .   Return in about 3 months (around 12/17/2023) for post op.

## 2023-09-23 ENCOUNTER — Ambulatory Visit: Payer: Medicare Other

## 2023-09-23 DIAGNOSIS — M25571 Pain in right ankle and joints of right foot: Secondary | ICD-10-CM | POA: Diagnosis not present

## 2023-09-23 DIAGNOSIS — M25671 Stiffness of right ankle, not elsewhere classified: Secondary | ICD-10-CM | POA: Diagnosis not present

## 2023-09-23 DIAGNOSIS — R2689 Other abnormalities of gait and mobility: Secondary | ICD-10-CM

## 2023-09-23 DIAGNOSIS — R600 Localized edema: Secondary | ICD-10-CM | POA: Diagnosis not present

## 2023-09-23 NOTE — Therapy (Signed)
OUTPATIENT PHYSICAL THERAPY LOWER EXTREMITY TREATMENT        Patient Name: Stephanie [name] MRN: [id number] DOB:12-Sep-1956, 67 y.o., female Today's Date: 09/23/2023  END OF SESSION:  PT End of Session - 09/23/23 1016     Visit Number 10    Number of Visits 23    Date for PT Re-Evaluation 11/08/23    Authorization Type [employer] MCR    Authorization Time Period 12/4-1/29/25    Authorization - Visit Number 1    Authorization - Number of Visits 8    Progress Note Due on Visit 19    PT Start Time 1017    PT Stop Time 1055    PT Time Calculation (min) 38 min    Activity Tolerance Patient tolerated treatment well    Behavior During Therapy [facility] for tasks assessed/performed               Past Medical History:  Diagnosis Date   Diverticulitis 01/21/2013   History of kidney stones    Nephrolithiasis    Past Surgical History:  Procedure Laterality Date   CHOLECYSTECTOMY  10/14/1980   JOINT REPLACEMENT Right 09/28/2020   knee   KNEE ARTHROSCOPY Right 02/2020   MANIPULATION KNEE JOINT Right 12/2020   TONSILECTOMY, ADENOIDECTOMY, BILATERAL MYRINGOTOMY AND TUBES     at age 63 approx.    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   S/P revision of total knee, right 09/24/2021   Trochanteric bursitis of left hip 09/22/2020   Lumbar spinal stenosis 10/04/2019   Primary osteoarthritis of right knee 03/03/2018   Cervical pain (neck) 10/02/2015   Insertional Achilles tendinosis, right leg  09/06/2015   Paresthesia and pain of right extremity 09/06/2015   Splenic artery aneurysm (HCC) 01/21/2013   Diverticulitis of colon without hemorrhage 01/21/2013   COSTOCHONDRITIS 01/01/2011   DYSPNEA ON EXERTION 01/01/2011   BENIGN NEOPLASM OF OTHER SPECIFIED SITES 12/18/2009    PCP: Agapito Games, MD   REFERRING PROVIDER: Louann Sjogren, DPM   REFERRING DIAG:  [phone] (ICD-10-CM) - Post-operative state  M76.61 (ICD-10-CM) - Achilles tendinitis, right leg    THERAPY DIAG:  Stiffness of right ankle, not elsewhere classified  Pain in right ankle and joints of right foot  Other abnormalities of gait and mobility  Localized edema  Rationale for Evaluation and Treatment: Rehabilitation  ONSET DATE: 06/03/23 - Right heel haglunds resection and secondary Achilles tendon repair   SUBJECTIVE:   SUBJECTIVE STATEMENT: Patient was able to go to the pool on her own and feels that has been helpful. She isn't really having pain, just feels tight.    Eval: Started wearing tennis shoe 10/3. She was WB for 2 weeks with the boot on. Has 3 lifts in shoe. Taking out one lift this Thursday and then one per week for 2 weeks after. Does not stand because of R knee and L hip is hurting. Standing is the worst, then walking. Standing limited to 1 min.  PERTINENT HISTORY: R TKR and  revision PAIN:  Are you having pain? Yes: NPRS scale: none currently; 2/10 at worst;  Pain location: medial R heel Pain description: sharp,sore Aggravating factors: palpation,walking,standing,overactivity Relieving factors: sitting  R knee pain reported at 4/5 constantly up to 5-6/10 with standing and walking.  PRECAUTIONS: None  RED FLAGS: None   WEIGHT BEARING RESTRICTIONS: Yes WBAT in CAM boot and may begin transition into regular shoes  FALLS:  Has patient fallen in last 6 months? No  LIVING ENVIRONMENT: Lives with: lives with their spouse Lives in: House/apartment Stairs: No Has  following equipment at home: Single point cane, Environmental consultant - 2 wheeled, Environmental consultant - 4 wheeled, and Wheelchair (manual)  OCCUPATION: retired  PLOF: Independent  PATIENT GOALS: to walk on foot normally without shooting pain up the back.  NEXT MD VISIT: November  OBJECTIVE:  Note: Objective measures were completed at Evaluation unless otherwise noted.  DIAGNOSTIC FINDINGS:  Post surgical XR IMPRESSION: 1. Interval Haglund resection and secondary Achilles tendon repair. 2. Mild talonavicular and navicular-cuneiform osteoarthritis.  PATIENT SURVEYS:  FOTO 52 (goal 62)  09/09/23: 54% function  09/15/23: 52% function  COGNITION: Overall cognitive status: Within functional limits for tasks assessed     SENSATION: WFL  EDEMA: edema around post R ankle  POSTURE: flexed trunk   PALPATION: Tender at proximal third of incision and at medial achilles, proximal gastroc  LOWER EXTREMITY ROM:  A/P ROM Right eval Left eval 09/09/23 AROM 09/15/23 AROM  Hip flexion      Hip extension      Hip abduction      Hip adduction      Hip internal rotation      Hip external rotation      Knee flexion Approximately 90 deg     Knee extension WFL     Ankle dorsiflexion 0/8  5 5   Ankle plantarflexion 72  65   Ankle inversion 27/33  20   Ankle eversion 20/24  15 pn    (Blank rows = not tested)  LOWER EXTREMITY MMT:  MMT Right eval Left eval 09/09/23 Right   Hip flexion 4+ 5   Hip extension     Hip abduction     Hip adduction     Hip internal rotation     Hip external rotation     Knee flexion 5 5   Knee extension 5 5   Ankle dorsiflexion 5 5 5   Ankle plantarflexion NT  Unable to perform SL calf raise   Ankle inversion 4 5 5   Ankle eversion 4 5 4+ pn   (Blank rows = not tested)   FUNCTIONAL TESTS:  5 times sit to stand: 11.2 sec  GAIT: Distance walked: 40 Assistive device utilized: None Level of assistance: Complete Independence Comments: decreased weight shift R, stance time  R, step length L   TODAY'S TREATMENT:        [facility] Adult PT Treatment:                                                DATE: 09/23/23 Therapeutic Exercise: Seated ankle rockerboard SL 2 x 10 A/P  Calf stretch on wedge x 1 minute  Eccentric calf raise 2 x 5  SLS  RLE x 30 sec Toe raises 2 x 10  Resisted ankle eversion/inversion green band 2 x 10  BAPS board circles CW/CCW 2  x 10  Resisted plantarflexion black band 2 x 10  Updated HEP   Self Care: Desensitization to heel   [facility] Adult PT Treatment:                                                DATE: 09/15/23 Therapeutic Exercise: NuStep level 5 x 5 minutes UE/LE  Calf stretch at wedge x 1 minute, unable to stretch soleus  MWM with black theraband for dorsiflexion x 10 Forward step overs black yoga block x 10 each  Lateral step overs black yoga block x 10  SL rockerboard A/P seated 2 x 10 each  Rockerboard lateral x 10  Updated HEP   Therapeutic Activity: Re-assessment of goals, educating patient on overall progress    [facility] Adult PT Treatment:                                                DATE: 09/09/23 Pt seen for aquatic therapy today.  Treatment took place in water 3.5-4.75 ft in depth at the [facility] pool. Temp of water was 91.  Pt entered/exited the pool via stairs in step-to pattern with bilat rail. * intro to aquatic therapy principles/ properties * unsupport in 69ft 6" water: walking forward/ backward -> repeated with UE on barbell - cues for heel/toe and toe/heel, and even step length- multiple laps * holding wall:  partial heel toe raises x 10;  hip abdct/ addct 3 x 5; hip flex/ext x 8 each * return to walking forward / backward 1 lap with barbell- improved tolerance * straddling noodle holding corner of deeper water/ yellow hand floats : cycling; hip abdct/ addct with DF; cross country ski LEs * return to walking forward/ backward with yellow hand floats under water  * UE on rainbow floats:  side stepping x  2 laps with cues for relaxed feet and knees (painful) * holding wall side step L/R * at 70ft: trial of R SLS x 5 sec (no pain) and R heel raise x 2  * after dried off:  applied reg Rock tape to medial R ankle (covering tender spot next to Achilles) wrapping under calcaneus to lateral ankle.  Perpendicular strip applied across back of calcaneus with 40% stretch to decompress tissue, decrease sensitivity and increase proprioception. Pt educated in tape rationale and safe tape removal technique; verbalized understanding.    PATIENT EDUCATION:  Education details: HEP update  Person educated: Patient Education method: Explanation, demo, cues, handout Education comprehension: verbalized understanding, returned demo, cues   HOME EXERCISE PROGRAM: Access Code: [email or web address] URL: [email or web address] Date: 09/23/2023 Prepared by: Letitia Libra  Exercises - Stride Stance Weight Shift  - 1 x daily - 7 x weekly - 3 sets - 10 reps - Standing Hip Abduction with Counter Support  - 1 x daily - 7 x weekly - 3 sets - 10 reps - Standing March with Counter Support  - 1 x daily - 7 x weekly - 3 sets - 10 reps - Standing Hip Extension with Counter Support  - 1 x daily - 7 x weekly - 3 sets - 10 reps - Seated Ankle Inversion with Resistance  - 1 x daily - 3-4 x weekly - 1 sets -  10 reps - Seated Ankle Eversion with Resistance  - 1 x daily - 3-4 x weekly - 1 sets - 10 reps - Ankle and Toe Plantarflexion with Resistance  - 1 x daily - 7 x weekly - 3 sets - 10 reps - Ankle Dorsiflexion with Resistance  - 1 x daily - 7 x weekly - 3 sets - 10 reps - Long Sitting Ankle PNF D1 Dorsiflexion with Resistance  - 1 x daily - 7 x weekly - 3 sets - 10 reps - Long Sitting Ankle PNF D2 Dorsiflexion with Resistance  - 1 x daily - 7 x weekly - 3 sets - 10 reps - Gastroc Stretch on Wall  - 1 x daily - 7 x weekly - 3 sets - 30 sec  hold - Seated Calf Raise with Weights on Thighs  - 1 x daily - 7 x weekly - 2 sets - 10  reps - Standing Eccentric Heel Raise  - 1 x daily - 7 x weekly - 2 sets - 10 reps - Single Leg Stance  - 1 x daily - 7 x weekly - 3 sets - 30 sec  hold - Toe Raises with Counter Support  - 1 x daily - 7 x weekly - 2 sets - 10 reps  ASSESSMENT:  CLINICAL IMPRESSION: Patient arrives without reports of pain. Able to progress ankle plantarflexion strengthening with patient able to complete eccentric calf raise without onset of pain, but quickly fatigues. She reported tightening in the heel with standing toe raises, otherwise no complaints with strength progression.   Eval: Patient is a 67 y.o. female who was seen today for physical therapy evaluation and treatment for R ankle pain s/p right heel haglunds resection and secondary Achilles tendon repair. She has expected limitations in ROM and strength affecting gait and balance. She has mild edema and some decreased scar tissue mobility with pain at proximal incision and medial heel. She also is limited with standing and walking due to ongoing R knee pain and has had a TKR and revision. She will benefit from skilled PT to address these deficits.    OBJECTIVE IMPAIRMENTS: Abnormal gait, decreased activity tolerance, decreased balance, decreased ROM, decreased strength, increased edema, increased muscle spasms, impaired flexibility, and pain.   ACTIVITY LIMITATIONS: standing, stairs, and locomotion level  PARTICIPATION LIMITATIONS: meal prep, cleaning, laundry, driving, shopping, and community activity  PERSONAL FACTORS: 1 comorbidity: chronic R knee pain  are also affecting patient's functional outcome.   REHAB POTENTIAL: Excellent  CLINICAL DECISION MAKING: Stable/uncomplicated  EVALUATION COMPLEXITY: Low   GOALS: Goals reviewed with patient? Yes  SHORT TERM GOALS: Target date: 08/12/2023 Patient will be independent with initial HEP. Baseline:  Goal status: MET  2.  Patient to demonstrate equal WB with gait and sit to stands.  Baseline:   09/09/23: antalgic gait  09/15/23: antalgic gait  Goal status: IN PROGRESS  3.  Patient able to perform SLS for 7 seconds or greater on R LE. Baseline:  09/09/23: 7 seconds  Goal status: MET   LONG TERM GOALS: Target date: 11/08/23 Patient will be independent with advanced/ongoing HEP to improve outcomes and carryover.  Baseline:  Goal status: progressing   2.  Patient will report at least 85% improvement in R ankle pain with standing and walking. Baseline:  09/09/23: 98%  Goal status: MET  3.  Patient will demonstrate improved functional R ankle AROM to  allow for normal gait and stair mechanics. Baseline:  Goal status: partially met  4.  Patient will demonstrate improved R ankle DF to 4+/5 or better. Baseline:  Goal status: MET  5.  Patient will be able to safely ambulate 600' with a normal gait pattern without increased ankle pain to access community.  Baseline:  09/09/23: antalgic RLE  09/15/23: antalgic RLE Goal status: progressing   6. Patient will be able to ascend/descend stairs with 1 HR and reciprocal step pattern safely to access home and community.  Baseline: may be limited by R knee 09/09/23: step to pattern mostly due to the knee, but does endorse pain the ankle  09/15/23: step to pattern  Goal status: progressing   7.  Patient will report 70 on FOTO  to demonstrate improved functional ability. Baseline:  Goal status: progressing    PLAN:  PT FREQUENCY: 2x/week  PT DURATION: 8 weeks  PLANNED INTERVENTIONS: Therapeutic exercises, Therapeutic activity, Neuromuscular re-education, Balance training, Gait training, Patient/Family education, Self Care, Joint mobilization, Stair training, Aquatic Therapy, Dry Needling, Electrical stimulation, Cryotherapy, Moist heat, Taping, Vasopneumatic device, Ionotophoresis 4mg /ml Dexamethasone, and Manual therapy  PLAN FOR NEXT SESSION: Follow [place] protocol per MD (in file), Focus on normalizing gait mechanics,  static balance activity, calf stretching and strengthening.   Letitia Libra, PT, DPT, ATC 09/23/23 10:55 AM

## 2023-09-26 ENCOUNTER — Encounter (HOSPITAL_BASED_OUTPATIENT_CLINIC_OR_DEPARTMENT_OTHER): Payer: Self-pay | Admitting: Physical Therapy

## 2023-09-26 ENCOUNTER — Ambulatory Visit (HOSPITAL_BASED_OUTPATIENT_CLINIC_OR_DEPARTMENT_OTHER): Payer: Medicare Other | Attending: Podiatry | Admitting: Physical Therapy

## 2023-09-26 DIAGNOSIS — M25571 Pain in right ankle and joints of right foot: Secondary | ICD-10-CM | POA: Diagnosis not present

## 2023-09-26 DIAGNOSIS — M25671 Stiffness of right ankle, not elsewhere classified: Secondary | ICD-10-CM | POA: Insufficient documentation

## 2023-09-26 DIAGNOSIS — R600 Localized edema: Secondary | ICD-10-CM | POA: Insufficient documentation

## 2023-09-26 DIAGNOSIS — R2689 Other abnormalities of gait and mobility: Secondary | ICD-10-CM | POA: Insufficient documentation

## 2023-09-26 NOTE — Therapy (Signed)
OUTPATIENT PHYSICAL THERAPY LOWER EXTREMITY TREATMENT  Patient Name: NEVA PRUE MRN: [id number] DOB:1956-06-01, 67 y.o., female Today's Date: 09/26/2023  END OF SESSION:  PT End of Session - 09/26/23 0859     Visit Number 11    Number of Visits 23    Date for PT Re-Evaluation 11/08/23    Authorization Type [employer] MCR    Authorization Time Period 12/4-1/29/25    Authorization - Visit Number 2    Authorization - Number of Visits 8    Progress Note Due on Visit 19    PT Start Time 0846    PT Stop Time 0930    PT Time Calculation (min) 44 min    Activity Tolerance Patient tolerated treatment well    Behavior During Therapy [facility] for tasks assessed/performed               Past Medical History:  Diagnosis Date   Diverticulitis 01/21/2013   History of kidney stones    Nephrolithiasis    Past Surgical History:  Procedure Laterality Date   CHOLECYSTECTOMY  10/14/1980   JOINT REPLACEMENT Right 09/28/2020   knee   KNEE ARTHROSCOPY Right 02/2020   MANIPULATION KNEE JOINT Right 12/2020   TONSILECTOMY, ADENOIDECTOMY, BILATERAL MYRINGOTOMY AND TUBES     at age 22 approx.    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   S/P revision of total knee, right 09/24/2021   Trochanteric bursitis of left hip 09/22/2020   Lumbar spinal stenosis 10/04/2019   Primary osteoarthritis of right knee 03/03/2018   Cervical pain (neck) 10/02/2015   Insertional Achilles tendinosis, right leg 09/06/2015    Paresthesia and pain of right extremity 09/06/2015   Splenic artery aneurysm (HCC) 01/21/2013   Diverticulitis of colon without hemorrhage 01/21/2013   COSTOCHONDRITIS 01/01/2011   DYSPNEA ON EXERTION 01/01/2011   BENIGN NEOPLASM OF OTHER SPECIFIED SITES 12/18/2009    PCP: Agapito Games, MD   REFERRING PROVIDER: Louann Sjogren, DPM   REFERRING DIAG:  8010287030 (ICD-10-CM) - Post-operative state  M76.61 (ICD-10-CM) - Achilles tendinitis, right leg    THERAPY DIAG:  Stiffness of right ankle, not elsewhere classified  Pain in right ankle and joints of right foot  Other abnormalities of gait and mobility  Localized edema  Rationale for Evaluation and Treatment: Rehabilitation  ONSET DATE: 06/03/23 - Right heel haglunds resection and secondary Achilles tendon repair   SUBJECTIVE:   SUBJECTIVE STATEMENT: "I can notice a big difference" (since getting in the pool).  Pt reports that she has been getting in the pool to exercise every other day or every 3 days.  She is noticing improvement in everything (strength, ROM) related to R ankle and knee.      Eval: Started wearing tennis shoe 10/3. She was WB for 2 weeks with the boot on. Has 3 lifts in shoe. Taking out one lift this Thursday and then one per week for 2 weeks after. Does not stand because of R knee and L hip is hurting.  Standing is the worst, then walking. Standing limited to 1 min.  PERTINENT HISTORY: R TKR and revision PAIN:  Are you having pain? no: NPRS scale: 0/10 Pain location:  Pain description:  Aggravating factors: palpation,walking,standing,overactivity Relieving factors: sitting  R knee is "stiff"  PRECAUTIONS: None  RED FLAGS: None   WEIGHT BEARING RESTRICTIONS: Yes WBAT in CAM boot and may begin transition into regular shoes  FALLS:  Has patient fallen in last 6 months? No  LIVING ENVIRONMENT: Lives with: lives with their spouse Lives in: House/apartment Stairs: No Has following  equipment at home: Single point cane, Environmental consultant - 2 wheeled, Environmental consultant - 4 wheeled, and Wheelchair (manual)  OCCUPATION: retired  PLOF: Independent  PATIENT GOALS: to walk on foot normally without shooting pain up the back.  NEXT MD VISIT: November  OBJECTIVE:  Note: Objective measures were completed at Evaluation unless otherwise noted.  DIAGNOSTIC FINDINGS:  Post surgical XR IMPRESSION: 1. Interval Haglund resection and secondary Achilles tendon repair. 2. Mild talonavicular and navicular-cuneiform osteoarthritis.  PATIENT SURVEYS:  FOTO 52 (goal 62)  09/09/23: 54% function  09/15/23: 52% function  COGNITION: Overall cognitive status: Within functional limits for tasks assessed     SENSATION: WFL  EDEMA: edema around post R ankle  POSTURE: flexed trunk   PALPATION: Tender at proximal third of incision and at medial achilles, proximal gastroc  LOWER EXTREMITY ROM:  A/P ROM Right eval Left eval 09/09/23 AROM 09/15/23 AROM  Hip flexion      Hip extension      Hip abduction      Hip adduction      Hip internal rotation      Hip external rotation      Knee flexion Approximately 90 deg     Knee extension WFL     Ankle dorsiflexion 0/8  5 5   Ankle plantarflexion 72  65   Ankle inversion 27/33  20   Ankle eversion 20/24  15 pn    (Blank rows = not tested)  LOWER EXTREMITY MMT:  MMT Right eval Left eval 09/09/23 Right   Hip flexion 4+ 5   Hip extension     Hip abduction     Hip adduction     Hip internal rotation     Hip external rotation     Knee flexion 5 5   Knee extension 5 5   Ankle dorsiflexion 5 5 5   Ankle plantarflexion NT  Unable to perform SL calf raise   Ankle inversion 4 5 5   Ankle eversion 4 5 4+ pn   (Blank rows = not tested)   FUNCTIONAL TESTS:  5 times sit to stand: 11.2 sec  GAIT: Distance walked: 40 Assistive device utilized: None Level of assistance: Complete Independence Comments: decreased weight shift R, stance time R, step  length L   TODAY'S TREATMENT:        [facility] Adult PT Treatment:                                                DATE: 09/26/23 Pt seen for aquatic therapy today.  Treatment took place in water 3.5-4.75 ft in depth at the [facility] pool. Temp of water was 91.  Pt entered/exited the pool via stairs in step-to pattern with bilat rail.  * unsupport in 34ft 6" water: walking forward/ backward- cues for heel/toe  and toe/heel, and even step length- multiple laps (improved) * holding wall:  heel raises - bilat x 10, unilateral x 10 each; calf stretch  * bilat knees to chest stretch with feet in bottom ladder hole and hands on rails/pool wall x 60s * R SLS with hands out of water x 30s (no difficulty) * straddling noodle without UE support: cycling; UE on wall- hip abdct/ addct with DF; cross country ski LEs * UE on blue hand floats:  Leg swings into hip abdct/ addct 2 x 12; hip flex/ext 2 x 12;  braiding R/L 2 laps; single leg clams x 10 each (difficult on R leg due to decreased ROM) * farmer carry - single blue/yellow hand float at side, walking forward and backward (challenge with increased resistance held on R side)  * UE on wall: split squat x 3 reps each LE forward * Bilat UE on rails: forward step down with LLE, retro step up with RLE x 3 (heavy UE support)    [facility] Adult PT Treatment:                                                DATE: 09/23/23 Therapeutic Exercise: Seated ankle rockerboard SL 2 x 10 A/P  Calf stretch on wedge x 1 minute  Eccentric calf raise 2 x 5  SLS  RLE x 30 sec Toe raises 2 x 10  Resisted ankle eversion/inversion green band 2 x 10  BAPS board circles CW/CCW 2 x 10  Resisted plantarflexion black band 2 x 10  Updated HEP   Self Care: Desensitization to heel   [facility] Adult PT Treatment:                                                DATE: 09/15/23 Therapeutic Exercise: NuStep level 5 x 5 minutes UE/LE  Calf stretch at wedge x 1 minute, unable to stretch  soleus  MWM with black theraband for dorsiflexion x 10 Forward step overs black yoga block x 10 each  Lateral step overs black yoga block x 10  SL rockerboard A/P seated 2 x 10 each  Rockerboard lateral x 10  Updated HEP   Therapeutic Activity: Re-assessment of goals, educating patient on overall progress    [facility] Adult PT Treatment:                                                DATE: 09/10/23 Pt seen for aquatic therapy today.  Treatment took place in water 3.5-4.75 ft in depth at the [facility] pool. Temp of water was 91.  Pt entered/exited the pool via stairs in step-to pattern with bilat rail. * intro to aquatic therapy principles/ properties * unsupport in 60ft 6" water: walking forward/ backward -> repeated with UE on barbell - cues for heel/toe and toe/heel, and even step length- multiple laps * holding wall:  partial heel toe raises x 10;  hip abdct/ addct 3 x 5; hip flex/ext x 8 each * return to walking forward / backward 1 lap with barbell- improved tolerance * straddling noodle holding  corner of deeper water/ yellow hand floats : cycling; hip abdct/ addct with DF; cross country ski LEs * return to walking forward/ backward with yellow hand floats under water  * UE on rainbow floats:  side stepping x 2 laps with cues for relaxed feet and knees (painful) * holding wall side step L/R * at 10ft: trial of R SLS x 5 sec (no pain) and R heel raise x 2  * after dried off:  applied reg Rock tape to medial R ankle (covering tender spot next to Achilles) wrapping under calcaneus to lateral ankle.  Perpendicular strip applied across back of calcaneus with 40% stretch to decompress tissue, decrease sensitivity and increase proprioception. Pt educated in tape rationale and safe tape removal technique; verbalized understanding.    PATIENT EDUCATION:  Education details: HEP update  Person educated: Patient Education method: Explanation, demo, cues, handout Education comprehension:  verbalized understanding, returned demo, cues   HOME EXERCISE PROGRAM: Access Code: [email or web address] URL: [email or web address] Date: 09/23/2023 Prepared by: Letitia Libra  Exercises - Stride Stance Weight Shift  - 1 x daily - 7 x weekly - 3 sets - 10 reps - Standing Hip Abduction with Counter Support  - 1 x daily - 7 x weekly - 3 sets - 10 reps - Standing March with Counter Support  - 1 x daily - 7 x weekly - 3 sets - 10 reps - Standing Hip Extension with Counter Support  - 1 x daily - 7 x weekly - 3 sets - 10 reps - Seated Ankle Inversion with Resistance  - 1 x daily - 3-4 x weekly - 1 sets - 10 reps - Seated Ankle Eversion with Resistance  - 1 x daily - 3-4 x weekly - 1 sets - 10 reps - Ankle and Toe Plantarflexion with Resistance  - 1 x daily - 7 x weekly - 3 sets - 10 reps - Ankle Dorsiflexion with Resistance  - 1 x daily - 7 x weekly - 3 sets - 10 reps - Long Sitting Ankle PNF D1 Dorsiflexion with Resistance  - 1 x daily - 7 x weekly - 3 sets - 10 reps - Long Sitting Ankle PNF D2 Dorsiflexion with Resistance  - 1 x daily - 7 x weekly - 3 sets - 10 reps - Gastroc Stretch on Wall  - 1 x daily - 7 x weekly - 3 sets - 30 sec  hold - Seated Calf Raise with Weights on Thighs  - 1 x daily - 7 x weekly - 2 sets - 10 reps - Standing Eccentric Heel Raise  - 1 x daily - 7 x weekly - 2 sets - 10 reps - Single Leg Stance  - 1 x daily - 7 x weekly - 3 sets - 30 sec  hold - Toe Raises with Counter Support  - 1 x daily - 7 x weekly - 2 sets - 10 reps   AQUATIC Access Code: [email or web address] URL: [email or web address] Date: 09/26/2023 Prepared by: [facility] * not issued yet    ASSESSMENT:  CLINICAL IMPRESSION: Significant improvement since last aquatic session 2 wks ago. Pt no longer has pain with side stepping and is demonstrating improved gait pattern with forward/ backward ambulation in water.  She is now able to stand 30s unassisted on RLE  without difficulty in water. She reported some increase in Rt knee discomfort with single leg clams (medial hamstring insertion area) and split squats at wall.  Good tolerance  for progression of exercise.  Will plan to issue laminated aquatic HEP next visit.  Add LE stretches with noodle next visit.      Eval: Patient is a 67 y.o. female who was seen today for physical therapy evaluation and treatment for R ankle pain s/p right heel haglunds resection and secondary Achilles tendon repair. She has expected limitations in ROM and strength affecting gait and balance. She has mild edema and some decreased scar tissue mobility with pain at proximal incision and medial heel. She also is limited with standing and walking due to ongoing R knee pain and has had a TKR and revision. She will benefit from skilled PT to address these deficits.    OBJECTIVE IMPAIRMENTS: Abnormal gait, decreased activity tolerance, decreased balance, decreased ROM, decreased strength, increased edema, increased muscle spasms, impaired flexibility, and pain.   ACTIVITY LIMITATIONS: standing, stairs, and locomotion level  PARTICIPATION LIMITATIONS: meal prep, cleaning, laundry, driving, shopping, and community activity  PERSONAL FACTORS: 1 comorbidity: chronic R knee pain  are also affecting patient's functional outcome.   REHAB POTENTIAL: Excellent  CLINICAL DECISION MAKING: Stable/uncomplicated  EVALUATION COMPLEXITY: Low   GOALS: Goals reviewed with patient? Yes  SHORT TERM GOALS: Target date: 08/12/2023 Patient will be independent with initial HEP. Baseline:  Goal status: MET  2.  Patient to demonstrate equal WB with gait and sit to stands.  Baseline:  09/09/23: antalgic gait  09/15/23: antalgic gait  Goal status: IN PROGRESS  3.  Patient able to perform SLS for 7 seconds or greater on R LE. Baseline:  09/09/23: 7 seconds  Goal status: MET   LONG TERM GOALS: Target date: 11/08/23 Patient will be  independent with advanced/ongoing HEP to improve outcomes and carryover.  Baseline:  Goal status: progressing   2.  Patient will report at least 85% improvement in R ankle pain with standing and walking. Baseline:  09/09/23: 98%  Goal status: MET  3.  Patient will demonstrate improved functional R ankle AROM to  allow for normal gait and stair mechanics. Baseline:  Goal status: partially met   4.  Patient will demonstrate improved R ankle DF to 4+/5 or better. Baseline:  Goal status: MET  5.  Patient will be able to safely ambulate 600' with a normal gait pattern without increased ankle pain to access community.  Baseline:  09/09/23: antalgic RLE  09/15/23: antalgic RLE Goal status: progressing   6. Patient will be able to ascend/descend stairs with 1 HR and reciprocal step pattern safely to access home and community.  Baseline: may be limited by R knee 09/09/23: step to pattern mostly due to the knee, but does endorse pain the ankle  09/15/23: step to pattern  Goal status: progressing   7.  Patient will report 31 on FOTO  to demonstrate improved functional ability. Baseline:  Goal status: progressing    PLAN:  PT FREQUENCY: 2x/week  PT DURATION: 8 weeks  PLANNED INTERVENTIONS: Therapeutic exercises, Therapeutic activity, Neuromuscular re-education, Balance training, Gait training, Patient/Family education, Self Care, Joint mobilization, Stair training, Aquatic Therapy, Dry Needling, Electrical stimulation, Cryotherapy, Moist heat, Taping, Vasopneumatic device, Ionotophoresis 4mg /ml Dexamethasone, and Manual therapy  PLAN FOR NEXT SESSION: Follow [place] protocol per MD (in file), Focus on normalizing gait mechanics, static balance activity, calf stretching and strengthening.   Mayer Camel, PTA 09/26/23 9:50 AM [facility] [address] [place], [place], [address] Phone: [phone]   Fax:  [phone]

## 2023-09-30 ENCOUNTER — Telehealth: Payer: Self-pay

## 2023-09-30 NOTE — Telephone Encounter (Signed)
Copied from CRM [id number]. Topic: Medical Record Request - Other >> Sep 23, 2023  3:26 PM Alvino Blood C wrote: Reason for CRM:  Ahmani called from [facility] looking for the PT Plan of Care Signed Document. Please call her at [phone]

## 2023-09-30 NOTE — Telephone Encounter (Signed)
Task completed. Left a vm msg for Ahmani at [phone] to contact the PT office directly for the plan of care details. Direct call back information provided.

## 2023-10-02 ENCOUNTER — Ambulatory Visit: Payer: Medicare Other

## 2023-10-02 DIAGNOSIS — R600 Localized edema: Secondary | ICD-10-CM

## 2023-10-02 DIAGNOSIS — R2689 Other abnormalities of gait and mobility: Secondary | ICD-10-CM

## 2023-10-02 DIAGNOSIS — M25671 Stiffness of right ankle, not elsewhere classified: Secondary | ICD-10-CM

## 2023-10-02 DIAGNOSIS — M25571 Pain in right ankle and joints of right foot: Secondary | ICD-10-CM

## 2023-10-02 NOTE — Therapy (Signed)
OUTPATIENT PHYSICAL THERAPY LOWER EXTREMITY TREATMENT  Patient Name: Stephanie [name] MRN: [id number] DOB:08/17/1956, 67 y.o., female Today's Date: 10/02/2023  END OF SESSION:  PT End of Session - 10/02/23 0846     Visit Number 12    Number of Visits 23    Date for PT Re-Evaluation 11/08/23    Authorization Type [employer] MCR    Authorization Time Period 12/4-1/29/25    Authorization - Visit Number 3    Authorization - Number of Visits 8    Progress Note Due on Visit 19    PT Start Time 0846    PT Stop Time 0928    PT Time Calculation (min) 42 min    Activity Tolerance Patient tolerated treatment well    Behavior During Therapy [facility] for tasks assessed/performed                Past Medical History:  Diagnosis Date   Diverticulitis 01/21/2013   History of kidney stones    Nephrolithiasis    Past Surgical History:  Procedure Laterality Date   CHOLECYSTECTOMY  10/14/1980   JOINT REPLACEMENT Right 09/28/2020   knee   KNEE ARTHROSCOPY Right 02/2020   MANIPULATION KNEE JOINT Right 12/2020   TONSILECTOMY, ADENOIDECTOMY, BILATERAL MYRINGOTOMY AND TUBES     at age 63 approx.    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   S/P revision of total knee, right 09/24/2021   Trochanteric bursitis of left hip 09/22/2020   Lumbar spinal stenosis 10/04/2019   Primary osteoarthritis of right knee 03/03/2018   Cervical pain (neck) 10/02/2015   Insertional Achilles tendinosis, right leg  09/06/2015   Paresthesia and pain of right extremity 09/06/2015   Splenic artery aneurysm (HCC) 01/21/2013   Diverticulitis of colon without hemorrhage 01/21/2013   COSTOCHONDRITIS 01/01/2011   DYSPNEA ON EXERTION 01/01/2011   BENIGN NEOPLASM OF OTHER SPECIFIED SITES 12/18/2009    PCP: Agapito Games, MD   REFERRING PROVIDER: Agapito Games, *   REFERRING DIAG:  [phone] (ICD-10-CM) - Post-operative state  M76.61 (ICD-10-CM) - Achilles tendinitis, right leg    THERAPY DIAG:  Stiffness of right ankle, not elsewhere classified  Pain in right ankle and joints of right foot  Other abnormalities of gait and mobility  Localized edema  Rationale for Evaluation and Treatment: Rehabilitation  ONSET DATE: 06/03/23 - Right heel haglunds resection and secondary Achilles tendon repair   SUBJECTIVE:   SUBJECTIVE STATEMENT: Went to the pool on Monday and maybe over did it.     Eval: Started wearing tennis shoe 10/3. She was WB for 2 weeks with the boot on. Has 3 lifts in shoe. Taking out one lift this Thursday and then one per week for 2 weeks after. Does not stand because of R knee and L hip is hurting. Standing is the worst, then walking. Standing limited to 1 min.  PERTINENT HISTORY: R TKR and revision PAIN:  Are you having pain? yes NPRS scale: 2/10 Pain location: Rt posterior to lateral  malleolus  Pain description: sharp Aggravating factors: palpation,walking,standing,overactivity Relieving factors: sitting    PRECAUTIONS: None  RED FLAGS: None   WEIGHT BEARING RESTRICTIONS: Yes WBAT in CAM boot and may begin transition into regular shoes  FALLS:  Has patient fallen in last 6 months? No  LIVING ENVIRONMENT: Lives with: lives with their spouse Lives in: House/apartment Stairs: No Has following equipment at home: Single point cane, Environmental consultant - 2 wheeled, Environmental consultant - 4 wheeled, and Wheelchair (manual)  OCCUPATION: retired  PLOF: Independent  PATIENT  GOALS: to walk on foot normally without shooting pain up the back.  NEXT MD VISIT: November  OBJECTIVE:  Note: Objective measures were completed at Evaluation unless otherwise noted.  DIAGNOSTIC FINDINGS:  Post surgical XR IMPRESSION: 1. Interval Haglund resection and secondary Achilles tendon repair. 2. Mild talonavicular and navicular-cuneiform osteoarthritis.  PATIENT SURVEYS:  FOTO 52 (goal 62)  09/09/23: 54% function  09/15/23: 52% function  COGNITION: Overall cognitive status: Within functional limits for tasks assessed     SENSATION: WFL  EDEMA: edema around post R ankle  POSTURE: flexed trunk   PALPATION: Tender at proximal third of incision and at medial achilles, proximal gastroc  LOWER EXTREMITY ROM:  A/P ROM Right eval Left eval 09/09/23 AROM 09/15/23 AROM  Hip flexion      Hip extension      Hip abduction      Hip adduction      Hip internal rotation      Hip external rotation      Knee flexion Approximately 90 deg     Knee extension WFL     Ankle dorsiflexion 0/8  5 5   Ankle plantarflexion 72  65   Ankle inversion 27/33  20   Ankle eversion 20/24  15 pn    (Blank rows = not tested)  LOWER EXTREMITY MMT:  MMT Right eval Left eval 09/09/23 Right   Hip flexion 4+ 5   Hip extension     Hip abduction     Hip adduction     Hip internal rotation     Hip external rotation     Knee flexion 5 5   Knee extension 5 5   Ankle dorsiflexion 5 5 5   Ankle plantarflexion NT  Unable to perform SL calf raise   Ankle inversion 4 5 5   Ankle eversion 4 5 4+ pn   (Blank rows = not tested)   FUNCTIONAL TESTS:  5 times sit to stand: 11.2 sec  GAIT: Distance walked: 40 Assistive device utilized: None Level of assistance: Complete Independence Comments: decreased weight shift R, stance time R, step length L   TODAY'S TREATMENT:        [facility] Adult PT Treatment:                                                DATE: 10/02/23 Therapeutic Exercise: SL  rockerboard 2 x 10  Calf stretch on wedge x 1 minute  Resisted backward walking x 10; 10 lbs  Lateral band walks at counter green band at shins 3 sets d/b  Calf raise on leg press 2 x 10 @ 40 lbs  Updated HEP  Manual Therapy: IASTM Rt gastroc/soleus, peroneals Passive calf stretching  Neuromuscular re-ed: Tandem walks at counter 3 sets d/b  Tandem on airex 2 x 30 sec    [facility] Adult  PT Treatment:                                                DATE: 09/26/23 Pt seen for aquatic therapy today.  Treatment took place in water 3.5-4.75 ft in depth at the [facility] pool. Temp of water was 91.  Pt entered/exited the pool via stairs in step-to pattern with bilat rail.  * unsupport in 62ft 6" water: walking forward/ backward- cues for heel/toe and toe/heel, and even step length- multiple laps (improved) * holding wall:  heel raises - bilat x 10, unilateral x 10 each; calf stretch  * bilat knees to chest stretch with feet in bottom ladder hole and hands on rails/pool wall x 60s * R SLS with hands out of water x 30s (no difficulty) * straddling noodle without UE support: cycling; UE on wall- hip abdct/ addct with DF; cross country ski LEs * UE on blue hand floats:  Leg swings into hip abdct/ addct 2 x 12; hip flex/ext 2 x 12;  braiding R/L 2 laps; single leg clams x 10 each (difficult on R leg due to decreased ROM) * farmer carry - single blue/yellow hand float at side, walking forward and backward (challenge with increased resistance held on R side)  * UE on wall: split squat x 3 reps each LE forward * Bilat UE on rails: forward step down with LLE, retro step up with RLE x 3 (heavy UE support)    [facility] Adult PT Treatment:                                                DATE: 09/23/23 Therapeutic Exercise: Seated ankle rockerboard SL 2 x 10 A/P  Calf stretch on wedge x 1 minute  Eccentric calf raise 2 x 5  SLS  RLE x 30 sec Toe raises 2 x 10  Resisted ankle eversion/inversion green  band 2 x 10  BAPS board circles CW/CCW 2 x 10  Resisted plantarflexion black band 2 x 10  Updated HEP   Self Care: Desensitization to heel   PATIENT EDUCATION:  Education details: HEP update  Person educated: Patient Education method: Explanation, demo, cues, handout Education comprehension: verbalized understanding, returned demo, cues   HOME EXERCISE PROGRAM: Access Code: [email or web address] URL: [email or web address] Date: 10/02/2023 Prepared by: Letitia Libra  Exercises - Stride Stance Weight Shift  - 1 x daily - 7 x weekly - 3 sets - 10 reps - Standing Hip Abduction with Counter Support  - 1 x daily - 7 x weekly - 3 sets - 10 reps - Standing March with Counter Support  - 1 x daily - 7 x weekly - 3 sets - 10 reps - Standing Hip Extension with Counter Support  - 1 x daily - 7 x weekly - 3 sets - 10 reps - Seated Ankle Inversion with Resistance  - 1 x daily - 3-4 x weekly - 1 sets - 10 reps - Seated Ankle Eversion with Resistance  - 1 x daily - 3-4 x weekly - 1 sets - 10 reps - Ankle and Toe Plantarflexion with Resistance  - 1 x daily - 7 x weekly - 3 sets - 10 reps - Ankle Dorsiflexion with  Resistance  - 1 x daily - 7 x weekly - 3 sets - 10 reps - Long Sitting Ankle PNF D1 Dorsiflexion with Resistance  - 1 x daily - 7 x weekly - 3 sets - 10 reps - Long Sitting Ankle PNF D2 Dorsiflexion with Resistance  - 1 x daily - 7 x weekly - 3 sets - 10 reps - Gastroc Stretch on Wall  - 1 x daily - 7 x weekly - 3 sets - 30 sec  hold - Seated Calf Raise with Weights on Thighs  - 1 x daily - 7 x weekly - 2 sets - 10 reps - Standing Eccentric Heel Raise  - 1 x daily - 7 x weekly - 2 sets - 10 reps - Single Leg Stance  - 1 x daily - 7 x weekly - 3 sets - 30 sec  hold - Toe Raises with Counter Support  - 1 x daily - 7 x weekly - 2 sets - 10 reps - Side Stepping with Resistance at Ankles and Counter Support  - 1 x daily - 7 x weekly - 2 sets - 10 reps - Heel Raises with Leg Press  - 1 x daily  - 7 x weekly - 2 sets - 10 reps - Tandem Walking  - 1 x daily - 7 x weekly - 3 sets - 10 reps   AQUATIC Access Code: [email or web address] URL: [email or web address] Date: 09/26/2023 Prepared by: [facility] - [address] * not issued yet    ASSESSMENT:  CLINICAL IMPRESSION: Patient tolerated session well today focusing on standing strength progression. Completed resisted backward walking with patient demonstrating good control and stability. Able to complete SL calf raise on leg press, but does report pain if she goes past neutral with eccentric motion, so was recommended to not perform past this range when she completes this as part of her HEP at the gym. Minimal sway noted with tandem balance activity.      Eval: Patient is a 67 y.o. female who was seen today for physical therapy evaluation and treatment for R ankle pain s/p right heel haglunds resection and secondary Achilles tendon repair. She has expected limitations in ROM and strength affecting gait and balance. She has mild edema and some decreased scar tissue mobility with pain at proximal incision and medial heel. She also is limited with standing and walking due to ongoing R knee pain and has had a TKR and revision. She will benefit from skilled PT to address these deficits.    OBJECTIVE IMPAIRMENTS: Abnormal gait, decreased activity tolerance, decreased balance, decreased ROM, decreased strength, increased edema, increased muscle spasms, impaired flexibility, and pain.   ACTIVITY LIMITATIONS: standing, stairs, and locomotion level  PARTICIPATION LIMITATIONS: meal prep, cleaning, laundry, driving, shopping, and community activity  PERSONAL FACTORS: 1 comorbidity: chronic R knee pain  are also affecting patient's functional outcome.   REHAB POTENTIAL: Excellent  CLINICAL DECISION MAKING: Stable/uncomplicated  EVALUATION COMPLEXITY: Low   GOALS: Goals reviewed with patient? Yes  SHORT TERM GOALS:  Target date: 08/12/2023 Patient will be independent with initial HEP. Baseline:  Goal status: MET  2.  Patient to demonstrate equal WB with gait and sit to stands.  Baseline:  09/09/23: antalgic gait  09/15/23: antalgic gait  Goal status: IN PROGRESS  3.  Patient able to perform SLS for 7 seconds or greater on R LE. Baseline:  09/09/23: 7 seconds  Goal status: MET   LONG TERM GOALS: Target date: 11/08/23 Patient  will be independent with advanced/ongoing HEP to improve outcomes and carryover.  Baseline:  Goal status: progressing   2.  Patient will report at least 85% improvement in R ankle pain with standing and walking. Baseline:  09/09/23: 98%  Goal status: MET  3.  Patient will demonstrate improved functional R ankle AROM to  allow for normal gait and stair mechanics. Baseline:  Goal status: partially met   4.  Patient will demonstrate improved R ankle DF to 4+/5 or better. Baseline:  Goal status: MET  5.  Patient will be able to safely ambulate 600' with a normal gait pattern without increased ankle pain to access community.  Baseline:  09/09/23: antalgic RLE  09/15/23: antalgic RLE Goal status: progressing   6. Patient will be able to ascend/descend stairs with 1 HR and reciprocal step pattern safely to access home and community.  Baseline: may be limited by R knee 09/09/23: step to pattern mostly due to the knee, but does endorse pain the ankle  09/15/23: step to pattern  Goal status: progressing   7.  Patient will report 59 on FOTO  to demonstrate improved functional ability. Baseline:  Goal status: progressing    PLAN:  PT FREQUENCY: 2x/week  PT DURATION: 8 weeks  PLANNED INTERVENTIONS: Therapeutic exercises, Therapeutic activity, Neuromuscular re-education, Balance training, Gait training, Patient/Family education, Self Care, Joint mobilization, Stair training, Aquatic Therapy, Dry Needling, Electrical stimulation, Cryotherapy, Moist heat, Taping,  Vasopneumatic device, Ionotophoresis 4mg /ml Dexamethasone, and Manual therapy  PLAN FOR NEXT SESSION: Follow [place] protocol per MD (in file), Focus on normalizing gait mechanics, static balance activity, calf stretching and strengthening.   Letitia Libra, PT, DPT, ATC 10/02/23 9:30 AM

## 2023-10-16 ENCOUNTER — Encounter (HOSPITAL_BASED_OUTPATIENT_CLINIC_OR_DEPARTMENT_OTHER): Payer: Self-pay

## 2023-10-16 ENCOUNTER — Ambulatory Visit (HOSPITAL_BASED_OUTPATIENT_CLINIC_OR_DEPARTMENT_OTHER): Payer: Medicare Other | Admitting: Physical Therapy

## 2023-10-20 ENCOUNTER — Encounter (HOSPITAL_BASED_OUTPATIENT_CLINIC_OR_DEPARTMENT_OTHER): Payer: Self-pay

## 2023-10-20 ENCOUNTER — Ambulatory Visit (HOSPITAL_BASED_OUTPATIENT_CLINIC_OR_DEPARTMENT_OTHER): Payer: Medicare Other | Admitting: Physical Therapy

## 2023-10-22 ENCOUNTER — Ambulatory Visit: Payer: Medicare Other | Attending: Podiatry

## 2023-10-22 ENCOUNTER — Telehealth: Payer: Self-pay | Admitting: Family Medicine

## 2023-10-22 DIAGNOSIS — R600 Localized edema: Secondary | ICD-10-CM | POA: Diagnosis not present

## 2023-10-22 DIAGNOSIS — R2689 Other abnormalities of gait and mobility: Secondary | ICD-10-CM | POA: Diagnosis not present

## 2023-10-22 DIAGNOSIS — M25671 Stiffness of right ankle, not elsewhere classified: Secondary | ICD-10-CM | POA: Diagnosis not present

## 2023-10-22 DIAGNOSIS — M25571 Pain in right ankle and joints of right foot: Secondary | ICD-10-CM | POA: Insufficient documentation

## 2023-10-22 NOTE — Telephone Encounter (Signed)
 Copied from CRM [id number]. Topic: General - Other >> Oct 22, 2023 11:22 AM Herbert Seta B wrote: Reason for CRM: Amani from [facility] calling needs plan of care signed by PCP and returned OUTPATIENT PHYSICAL THERAPY LOWER EXTREMITY TREATMENT Fax [phone]

## 2023-10-22 NOTE — Therapy (Signed)
 OUTPATIENT PHYSICAL THERAPY LOWER EXTREMITY TREATMENT  Patient Name: Stephanie [name] MRN: [id number] DOB:06/15/1956, 68 y.o., female Today's Date: 10/22/2023  END OF SESSION:  PT End of Session - 10/22/23 0933     Visit Number 13    Number of Visits 23    Date for PT Re-Evaluation 11/08/23    Authorization Type [employer] MCR    Authorization Time Period 12/4-1/29/25    Authorization - Visit Number 4    Authorization - Number of Visits 8    Progress Note Due on Visit 19    PT Start Time 0932    PT Stop Time 1012    PT Time Calculation (min) 40 min    Activity Tolerance Patient tolerated treatment well    Behavior During Therapy [facility] for tasks assessed/performed                 Past Medical History:  Diagnosis Date   Diverticulitis 01/21/2013   History of kidney stones    Nephrolithiasis    Past Surgical History:  Procedure Laterality Date   CHOLECYSTECTOMY  10/14/1980   JOINT REPLACEMENT Right 09/28/2020   knee   KNEE ARTHROSCOPY Right 02/2020   MANIPULATION KNEE JOINT Right 12/2020   TONSILECTOMY, ADENOIDECTOMY, BILATERAL MYRINGOTOMY AND TUBES     at age 23 approx.    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   S/P revision of total knee, right 09/24/2021   Trochanteric bursitis of left hip 09/22/2020   Lumbar spinal stenosis 10/04/2019   Primary osteoarthritis of right knee 03/03/2018   Cervical pain (neck) 10/02/2015   Insertional Achilles tendinosis, right leg  09/06/2015   Paresthesia and pain of right extremity 09/06/2015   Splenic artery aneurysm (HCC) 01/21/2013   Diverticulitis of colon without hemorrhage 01/21/2013   COSTOCHONDRITIS 01/01/2011   DYSPNEA ON EXERTION 01/01/2011   BENIGN NEOPLASM OF OTHER SPECIFIED SITES 12/18/2009    PCP: Alvan Dorothyann BIRCH, MD   REFERRING PROVIDER: Joya Stabs, DPM   REFERRING DIAG:  [phone] (ICD-10-CM) - Post-operative state  M76.61 (ICD-10-CM) - Achilles tendinitis, right leg    THERAPY DIAG:  Stiffness of right ankle, not elsewhere classified  Pain in right ankle and joints of right foot  Other abnormalities of gait and mobility  Localized edema  Rationale for Evaluation and Treatment: Rehabilitation  ONSET DATE: 06/03/23 - Right heel haglunds resection and secondary Achilles tendon repair   SUBJECTIVE:   SUBJECTIVE STATEMENT: Patient reports she is feeling good. She has been to the pool on her own a couple times, which has been helpful. Still gets pain if she has been on her feet for a long time.     Eval: Started wearing tennis shoe 10/3. She was WB for 2 weeks with the boot on. Has 3 lifts in shoe. Taking out one lift this Thursday and then one per week for 2 weeks after. Does not stand because of R knee and L hip is hurting. Standing is the worst, then walking. Standing limited to  1 min.  PERTINENT HISTORY: R TKR and revision PAIN:  Are you having pain? yes NPRS scale: none currently; at worst 3/10 Pain location: Rt posterior to lateral malleolus  Pain description: burning Aggravating factors: prolonged walking/activity  Relieving factors: sitting    PRECAUTIONS: None  RED FLAGS: None   WEIGHT BEARING RESTRICTIONS: Yes WBAT in CAM boot and may begin transition into regular shoes  FALLS:  Has patient fallen in last 6 months? No  LIVING ENVIRONMENT: Lives with: lives with their spouse Lives in: House/apartment Stairs: No Has following equipment at home:  Single point cane, Environmental Consultant - 2 wheeled, Environmental Consultant - 4 wheeled, and Wheelchair (manual)  OCCUPATION: retired  PLOF: Independent  PATIENT GOALS: to walk on foot normally without shooting pain up the back.  NEXT MD VISIT: November  OBJECTIVE:  Note: Objective measures were completed at Evaluation unless otherwise noted.  DIAGNOSTIC FINDINGS:  Post surgical XR IMPRESSION: 1. Interval Haglund resection and secondary Achilles tendon repair. 2. Mild talonavicular and navicular-cuneiform osteoarthritis.  PATIENT SURVEYS:  FOTO 52 (goal 62)  09/09/23: 54% function  09/15/23: 52% function  10/22/23: 69% function  COGNITION: Overall cognitive status: Within functional limits for tasks assessed     SENSATION: WFL  EDEMA: edema around post R ankle  POSTURE: flexed trunk   PALPATION: Tender at proximal third of incision and at medial achilles, proximal gastroc  LOWER EXTREMITY ROM:  A/P ROM Right eval Left eval 09/09/23 AROM 09/15/23 AROM  Hip flexion      Hip extension      Hip abduction      Hip adduction      Hip internal rotation      Hip external rotation      Knee flexion Approximately 90 deg     Knee extension WFL     Ankle dorsiflexion 0/8  5 5   Ankle plantarflexion 72  65   Ankle inversion 27/33  20   Ankle eversion 20/24  15 pn    (Blank rows = not tested)  LOWER EXTREMITY MMT:  MMT Right eval Left eval 09/09/23 Right   Hip flexion 4+ 5   Hip extension     Hip abduction     Hip adduction     Hip internal rotation     Hip external rotation     Knee flexion 5 5   Knee extension 5 5   Ankle dorsiflexion 5 5 5   Ankle plantarflexion NT  Unable to perform SL calf raise   Ankle inversion 4 5 5   Ankle eversion 4 5 4+ pn   (Blank rows = not tested)   FUNCTIONAL TESTS:  5 times sit to stand: 11.2 sec  GAIT: Distance walked: 40 Assistive device utilized: None Level of assistance: Complete Independence Comments: decreased weight shift R, stance time R,  step length L   TODAY'S TREATMENT:        [facility] Adult PT Treatment:                                                DATE: 10/22/23 Therapeutic Exercise: Calf stretch on wedge x 1 minute  Calf raise 2 x 10  Standing rockerboard 2 x 10  Walking 1 lap in clinic  Seated calf raise on step 2 x 10 @ 5 lbs  Eccentric calf raise attempted d/c due to pain and  weakness   Neuromuscular re-ed: Tandem stance 2 x 30 sec each  SLS x 30 sec each  Tandem walk 2 x 15 ft  Self Care: Walking program with handout provided Desensitization   [facility] Adult PT Treatment:                                                DATE: 10/02/23 Therapeutic Exercise: SL rockerboard 2 x 10  Calf stretch on wedge x 1 minute  Resisted backward walking x 10; 10 lbs  Lateral band walks at counter green band at shins 3 sets d/b  Calf raise on leg press 2 x 10 @ 40 lbs  Updated HEP  Manual Therapy: IASTM Rt gastroc/soleus, peroneals Passive calf stretching  Neuromuscular re-ed: Tandem walks at counter 3 sets d/b  Tandem on airex 2 x 30 sec    [facility] Adult PT Treatment:                                                DATE: 09/26/23 Pt seen for aquatic therapy today.  Treatment took place in water 3.5-4.75 ft in depth at the [facility] pool. Temp of water was 91.  Pt entered/exited the pool via stairs in step-to pattern with bilat rail.  * unsupport in 40ft 6 water: walking forward/ backward- cues for heel/toe and toe/heel, and even step length- multiple laps (improved) * holding wall:  heel raises - bilat x 10, unilateral x 10 each; calf stretch  * bilat knees to chest stretch with feet in bottom ladder hole and hands on rails/pool wall x 60s * R SLS with hands out of water x 30s (no difficulty) * straddling noodle without UE support: cycling; UE on wall- hip abdct/ addct with DF; cross country ski LEs * UE on blue hand floats:  Leg swings into hip abdct/ addct 2 x 12; hip flex/ext 2 x 12;  braiding R/L 2 laps;  single leg clams x 10 each (difficult on R leg due to decreased ROM) * farmer carry - single blue/yellow hand float at side, walking forward and backward (challenge with increased resistance held on R side)  * UE on wall: split squat x 3 reps each LE forward * Bilat UE on rails: forward step down with LLE, retro step up with RLE x 3 (heavy UE support)    PATIENT EDUCATION:  Education details: see treatment  Person educated: Patient Education method: Explanation, handout Education comprehension: verbalized understanding,  HOME EXERCISE PROGRAM: Access Code: [email or web address] URL: [email or web address] Date: 10/02/2023 Prepared by: Lucie Meeter  Exercises - Stride Stance Weight Shift  - 1 x daily - 7 x weekly - 3 sets - 10 reps - Standing Hip Abduction with Counter Support  - 1 x daily - 7 x weekly - 3 sets - 10 reps - Standing March with Counter Support  - 1 x daily - 7 x weekly - 3 sets - 10 reps - Standing Hip Extension with Counter Support  - 1 x daily - 7 x weekly - 3 sets - 10 reps - Seated Ankle Inversion with Resistance  - 1 x daily - 3-4 x weekly - 1 sets - 10 reps - Seated Ankle Eversion with Resistance  -  1 x daily - 3-4 x weekly - 1 sets - 10 reps - Ankle and Toe Plantarflexion with Resistance  - 1 x daily - 7 x weekly - 3 sets - 10 reps - Ankle Dorsiflexion with Resistance  - 1 x daily - 7 x weekly - 3 sets - 10 reps - Long Sitting Ankle PNF D1 Dorsiflexion with Resistance  - 1 x daily - 7 x weekly - 3 sets - 10 reps - Long Sitting Ankle PNF D2 Dorsiflexion with Resistance  - 1 x daily - 7 x weekly - 3 sets - 10 reps - Gastroc Stretch on Wall  - 1 x daily - 7 x weekly - 3 sets - 30 sec  hold - Seated Calf Raise with Weights on Thighs  - 1 x daily - 7 x weekly - 2 sets - 10 reps - Standing Eccentric Heel Raise  - 1 x daily - 7 x weekly - 2 sets - 10 reps - Single Leg Stance  - 1 x daily - 7 x weekly - 3 sets - 30 sec  hold - Toe Raises with Counter Support  - 1 x  daily - 7 x weekly - 2 sets - 10 reps - Side Stepping with Resistance at Ankles and Counter Support  - 1 x daily - 7 x weekly - 2 sets - 10 reps - Heel Raises with Leg Press  - 1 x daily - 7 x weekly - 2 sets - 10 reps - Tandem Walking  - 1 x daily - 7 x weekly - 3 sets - 10 reps   AQUATIC Access Code: [email or web address] URL: [email or web address] Date: 09/26/2023 Prepared by: [facility] - [address] * not issued yet    ASSESSMENT:  CLINICAL IMPRESSION: Patient continues to report improvement in Rt ankle pain. Able to progress ankle strengthening with good tolerance. Able to complete full DL calf raise without onset of pain. Due to pain and weakness unable to complete SL eccentric calf raise on the RLE. FOTO score has significantly improved having met this LTG. She demonstrates normalized gait pattern when walking in clinic without onset of pain, having met these associated goals.      Eval: Patient is a 68 y.o. female who was seen today for physical therapy evaluation and treatment for R ankle pain s/p right heel haglunds resection and secondary Achilles tendon repair. She has expected limitations in ROM and strength affecting gait and balance. She has mild edema and some decreased scar tissue mobility with pain at proximal incision and medial heel. She also is limited with standing and walking due to ongoing R knee pain and has had a TKR and revision. She will benefit from skilled PT to address these deficits.    OBJECTIVE IMPAIRMENTS: Abnormal gait, decreased activity tolerance, decreased balance, decreased ROM, decreased strength, increased edema, increased muscle spasms, impaired flexibility, and pain.   ACTIVITY LIMITATIONS: standing, stairs, and locomotion level  PARTICIPATION LIMITATIONS: meal prep, cleaning, laundry, driving, shopping, and community activity  PERSONAL FACTORS: 1 comorbidity: chronic R knee pain  are also affecting patient's functional  outcome.   REHAB POTENTIAL: Excellent  CLINICAL DECISION MAKING: Stable/uncomplicated  EVALUATION COMPLEXITY: Low   GOALS: Goals reviewed with patient? Yes  SHORT TERM GOALS: Target date: 08/12/2023 Patient will be independent with initial HEP. Baseline:  Goal status: MET  2.  Patient to demonstrate equal WB with gait and sit to stands.  Baseline:  09/09/23: antalgic gait  09/15/23: antalgic gait  10/22/23: normalized gait  Goal status: MET  3.  Patient able to perform SLS for 7 seconds or greater on R LE. Baseline:  09/09/23: 7 seconds  Goal status: MET   LONG TERM GOALS: Target date: 11/08/23 Patient will be independent with advanced/ongoing HEP to improve outcomes and carryover.  Baseline:  Goal status: progressing   2.  Patient will report at least 85% improvement in R ankle pain with standing and walking. Baseline:  09/09/23: 98%  Goal status: MET  3.  Patient will demonstrate improved functional R ankle AROM to  allow for normal gait and stair mechanics. Baseline:  Goal status: partially met   4.  Patient will demonstrate improved R ankle DF to 4+/5 or better. Baseline:  Goal status: MET  5.  Patient will be able to safely ambulate 600' with a normal gait pattern without increased ankle pain to access community.  Baseline:  09/09/23: antalgic RLE  09/15/23: antalgic RLE 10/22/23: normal gait pattern in clinic, reports no difficulty with community ambulation  Goal status: MET  6. Patient will be able to ascend/descend stairs with 1 HR and reciprocal step pattern safely to access home and community.  Baseline: may be limited by R knee 09/09/23: step to pattern mostly due to the knee, but does endorse pain the ankle  09/15/23: step to pattern  Goal status: progressing   7.  Patient will report 59 on FOTO  to demonstrate improved functional ability. Baseline:  Goal status: MET    PLAN:  PT FREQUENCY: 2x/week  PT DURATION: 8 weeks  PLANNED  INTERVENTIONS: Therapeutic exercises, Therapeutic activity, Neuromuscular re-education, Balance training, Gait training, Patient/Family education, Self Care, Joint mobilization, Stair training, Aquatic Therapy, Dry Needling, Electrical stimulation, Cryotherapy, Moist heat, Taping, Vasopneumatic device, Ionotophoresis 4mg /ml Dexamethasone , and Manual therapy  PLAN FOR NEXT SESSION: Follow [place] protocol per MD (in file), static balance activity, calf stretching and strengthening. Work to Therapist, Art and anticipate discharge soon.   Yanette Tripoli, PT, DPT, ATC 10/22/23 10:15 AM

## 2023-10-24 NOTE — Telephone Encounter (Signed)
 This was signed by Dr. Karie Schwalbe and faxed

## 2023-11-05 ENCOUNTER — Ambulatory Visit (HOSPITAL_BASED_OUTPATIENT_CLINIC_OR_DEPARTMENT_OTHER): Payer: Medicare Other | Attending: Podiatry | Admitting: Physical Therapy

## 2023-11-05 ENCOUNTER — Encounter (HOSPITAL_BASED_OUTPATIENT_CLINIC_OR_DEPARTMENT_OTHER): Payer: Self-pay | Admitting: Physical Therapy

## 2023-11-05 DIAGNOSIS — M25571 Pain in right ankle and joints of right foot: Secondary | ICD-10-CM | POA: Diagnosis not present

## 2023-11-05 DIAGNOSIS — M25671 Stiffness of right ankle, not elsewhere classified: Secondary | ICD-10-CM | POA: Insufficient documentation

## 2023-11-05 DIAGNOSIS — R2689 Other abnormalities of gait and mobility: Secondary | ICD-10-CM | POA: Insufficient documentation

## 2023-11-05 NOTE — Therapy (Signed)
OUTPATIENT PHYSICAL THERAPY LOWER EXTREMITY TREATMENT  Patient Name: Stephanie [name] MRN: [id number] DOB:November 29, 1955, 68 y.o., female Today's Date: 11/05/2023  END OF SESSION:  PT End of Session - 11/05/23 1202     Visit Number 14    Number of Visits 23    Date for PT Re-Evaluation 11/08/23    Authorization Type [employer] MCR    Authorization Time Period 12/4-1/29/25    Authorization - Number of Visits 8    Progress Note Due on Visit 19    PT Start Time 1158    PT Stop Time 1238    PT Time Calculation (min) 40 min    Behavior During Therapy [facility] for tasks assessed/performed                 Past Medical History:  Diagnosis Date   Diverticulitis 01/21/2013   History of kidney stones    Nephrolithiasis    Past Surgical History:  Procedure Laterality Date   CHOLECYSTECTOMY  10/14/1980   JOINT REPLACEMENT Right 09/28/2020   knee   KNEE ARTHROSCOPY Right 02/2020   MANIPULATION KNEE JOINT Right 12/2020   TONSILECTOMY, ADENOIDECTOMY, BILATERAL MYRINGOTOMY AND TUBES     at age 50 approx.    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   S/P revision of total knee, right 09/24/2021   Trochanteric bursitis of left hip 09/22/2020   Lumbar spinal stenosis 10/04/2019   Primary osteoarthritis of right knee 03/03/2018   Cervical pain (neck) 10/02/2015   Insertional Achilles tendinosis, right leg 09/06/2015   Paresthesia and pain of right extremity 09/06/2015   Splenic artery aneurysm (HCC)  01/21/2013   Diverticulitis of colon without hemorrhage 01/21/2013   COSTOCHONDRITIS 01/01/2011   DYSPNEA ON EXERTION 01/01/2011   BENIGN NEOPLASM OF OTHER SPECIFIED SITES 12/18/2009    PCP: Agapito Games, MD   REFERRING PROVIDER: Agapito Games, *   REFERRING DIAG:  [phone] (ICD-10-CM) - Post-operative state  M76.61 (ICD-10-CM) - Achilles tendinitis, right leg    THERAPY DIAG:  Stiffness of right ankle, not elsewhere classified  Pain in right ankle and joints of right foot  Other abnormalities of gait and mobility  Rationale for Evaluation and Treatment: Rehabilitation  ONSET DATE: 06/03/23 - Right heel haglunds resection and secondary Achilles tendon repair   SUBJECTIVE:   SUBJECTIVE STATEMENT: Patient reports that she is attending the pool on own, and it is feeling great in the water.  She wore sneaker for ~2 hrs Sunday.  She feels confident to d/c from aquatic therapy after todays session.     Eval: Started wearing tennis shoe 10/3. She was WB for 2 weeks with the boot on. Has 3 lifts in shoe. Taking out one lift this Thursday and then one per week for 2 weeks after. Does not stand because of R knee and L hip is hurting. Standing is the worst, then walking. Standing limited to 1 min.  PERTINENT HISTORY: R TKR and revision PAIN:  Are you having pain? No  NPRS scale: none  Pain location:  Pain description:  Aggravating factors: prolonged walking/activity  Relieving factors: sitting    PRECAUTIONS: None  RED FLAGS: None   WEIGHT BEARING RESTRICTIONS: Yes WBAT in CAM boot and may begin transition into regular shoes  FALLS:  Has patient fallen in last 6 months? No  LIVING ENVIRONMENT: Lives with: lives with their spouse Lives in: House/apartment Stairs: No Has following equipment at home: Single point cane, Environmental consultant - 2 wheeled, Environmental consultant - 4 wheeled, and Wheelchair (manual)  OCCUPATION: retired  PLOF: Independent  PATIENT GOALS: to walk on  foot normally without shooting pain up the back.  NEXT MD VISIT: November  OBJECTIVE:  Note: Objective measures were completed at Evaluation unless otherwise noted.  DIAGNOSTIC FINDINGS:  Post surgical XR IMPRESSION: 1. Interval Haglund resection and secondary Achilles tendon repair. 2. Mild talonavicular and navicular-cuneiform osteoarthritis.  PATIENT SURVEYS:  FOTO 52 (goal 62)  09/09/23: 54% function  09/15/23: 52% function  10/22/23: 69% function  COGNITION: Overall cognitive status: Within functional limits for tasks assessed     SENSATION: WFL  EDEMA: edema around post R ankle  POSTURE: flexed trunk   PALPATION: Tender at proximal third of incision and at medial achilles, proximal gastroc  LOWER EXTREMITY ROM:  A/P ROM Right eval Left eval 09/09/23 AROM 09/15/23 AROM  Hip flexion      Hip extension      Hip abduction      Hip adduction      Hip internal rotation      Hip external rotation      Knee flexion Approximately 90 deg     Knee extension WFL     Ankle dorsiflexion 0/8  5 5   Ankle plantarflexion 72  65   Ankle inversion 27/33  20   Ankle eversion 20/24  15 pn    (Blank rows = not tested)  LOWER EXTREMITY MMT:  MMT Right eval Left eval 09/09/23 Right   Hip flexion 4+ 5   Hip extension     Hip abduction     Hip adduction     Hip internal rotation     Hip external rotation     Knee flexion 5 5   Knee extension 5 5   Ankle dorsiflexion 5 5 5   Ankle plantarflexion NT  Unable to perform SL calf raise   Ankle inversion 4 5 5   Ankle eversion 4 5 4+ pn   (Blank rows = not tested)   FUNCTIONAL TESTS:  5 times sit to stand: 11.2 sec  GAIT: Distance walked: 40 Assistive device utilized: None Level of assistance: Complete Independence Comments: decreased weight shift R, stance time R, step length L   TODAY'S TREATMENT:        [facility] Adult PT Treatment:                                                DATE: 09/26/23 Pt seen for aquatic  therapy today.  Treatment took place in water 3.5-4.75 ft in depth at the [facility] pool. Temp of water was 91.  Pt entered/exited the pool via stairs in step-to pattern with bilat rail. * bilat heel raises -> calf stretch at wall  * issued laminated HEP and reviewed * unsupport in 82ft 6" water: walking forward/ backward- with reciprocal arm swing -> with rainbow hand floats *  farmer carry with single yellow hand float at side x 2 laps each * runners step ups x 10 each LE * BUE on rails, step down with LLE, retro step up x 3 * Rt quad stretch with foot on step behind her x 30 sec * SLS with hollow noodle press to ground x 10 each LE (no UE support)  * UE on yellow hand floats: legs swings into hip flex/ext and abdct/ addct x 10 each;  forward leans "single leg supermans" x 10 each LE, cues to slow down (good balance challenge) * 3 way LE stretch (ITB, hamstring, adductor) with ankle on hollow pool noodle, 2 reps, 15sec each position * UE on wall: split squat x 5 reps each LE forward * forward lunge on step for R knee ROM    [facility] Adult PT Treatment:                                                DATE: 10/22/23 Therapeutic Exercise: Calf stretch on wedge x 1 minute  Calf raise 2 x 10  Standing rockerboard 2 x 10  Walking 1 lap in clinic  Seated calf raise on step 2 x 10 @ 5 lbs  Eccentric calf raise attempted d/c due to pain and weakness   Neuromuscular re-ed: Tandem stance 2 x 30 sec each  SLS x 30 sec each  Tandem walk 2 x 15 ft  Self Care: Walking program with handout provided Desensitization   [facility] Adult PT Treatment:                                                DATE: 10/02/23 Therapeutic Exercise: SL rockerboard 2 x 10  Calf stretch on wedge x 1 minute  Resisted backward walking x 10; 10 lbs  Lateral band walks at counter green band at shins 3 sets d/b  Calf raise on leg press 2 x 10 @ 40 lbs  Updated HEP  Manual Therapy: IASTM Rt gastroc/soleus,  peroneals Passive calf stretching  Neuromuscular re-ed: Tandem walks at counter 3 sets d/b  Tandem on airex 2 x 30 sec    [facility] Adult PT Treatment:                                                DATE: 09/26/23 Pt seen for aquatic therapy today.  Treatment took place in water 3.5-4.75 ft in depth at the [facility] pool. Temp of water was 91.  Pt entered/exited the pool via stairs in step-to pattern with bilat rail.  * unsupport in 59ft 6" water: walking forward/ backward- cues for heel/toe and toe/heel, and even step length- multiple laps (improved) * holding wall:  heel raises - bilat x 10, unilateral x 10 each; calf stretch  * bilat knees to chest stretch with feet in bottom ladder hole and hands on rails/pool wall x 60s * R SLS with hands out of water x 30s (no difficulty) * straddling noodle without UE support: cycling; UE on wall- hip abdct/ addct with DF; cross country ski LEs * UE on blue hand  floats:  Leg swings into hip abdct/ addct 2 x 12; hip flex/ext 2 x 12;  braiding R/L 2 laps; single leg clams x 10 each (difficult on R leg due to decreased ROM) * farmer carry - single blue/yellow hand float at side, walking forward and backward (challenge with increased resistance held on R side)  * UE on wall: split squat x 3 reps each LE forward * Bilat UE on rails: forward step down with LLE, retro step up with RLE x 3 (heavy UE support)    PATIENT EDUCATION:  Education details: see treatment  Person educated: Patient Education method: Explanation, handout Education comprehension: verbalized understanding,  HOME EXERCISE PROGRAM: Access Code: [email or web address] URL: [email or web address] Date: 10/02/2023 Prepared by: Letitia Libra  Exercises - Stride Stance Weight Shift  - 1 x daily - 7 x weekly - 3 sets - 10 reps - Standing Hip Abduction with Counter Support  - 1 x daily - 7 x weekly - 3 sets - 10 reps - Standing March with Counter Support  - 1 x daily - 7 x weekly -  3 sets - 10 reps - Standing Hip Extension with Counter Support  - 1 x daily - 7 x weekly - 3 sets - 10 reps - Seated Ankle Inversion with Resistance  - 1 x daily - 3-4 x weekly - 1 sets - 10 reps - Seated Ankle Eversion with Resistance  - 1 x daily - 3-4 x weekly - 1 sets - 10 reps - Ankle and Toe Plantarflexion with Resistance  - 1 x daily - 7 x weekly - 3 sets - 10 reps - Ankle Dorsiflexion with Resistance  - 1 x daily - 7 x weekly - 3 sets - 10 reps - Long Sitting Ankle PNF D1 Dorsiflexion with Resistance  - 1 x daily - 7 x weekly - 3 sets - 10 reps - Long Sitting Ankle PNF D2 Dorsiflexion with Resistance  - 1 x daily - 7 x weekly - 3 sets - 10 reps - Gastroc Stretch on Wall  - 1 x daily - 7 x weekly - 3 sets - 30 sec  hold - Seated Calf Raise with Weights on Thighs  - 1 x daily - 7 x weekly - 2 sets - 10 reps - Standing Eccentric Heel Raise  - 1 x daily - 7 x weekly - 2 sets - 10 reps - Single Leg Stance  - 1 x daily - 7 x weekly - 3 sets - 30 sec  hold - Toe Raises with Counter Support  - 1 x daily - 7 x weekly - 2 sets - 10 reps - Side Stepping with Resistance at Ankles and Counter Support  - 1 x daily - 7 x weekly - 2 sets - 10 reps - Heel Raises with Leg Press  - 1 x daily - 7 x weekly - 2 sets - 10 reps - Tandem Walking  - 1 x daily - 7 x weekly - 3 sets - 10 reps   AQUATIC Access Code: [email or web address] URL: [email or web address] Date: 11/05/23 Prepared by: [facility] [address] This aquatic home exercise program from [employer] utilizes pictures from land based exercises, but has been adapted prior to lamination and issuance.      ASSESSMENT:  CLINICAL IMPRESSION: Pt tolerated progression of aquatic exercises well, without increase in symptoms.  She verbalized readiness to d/c from aquatic therapy at this time. Pt to reach out to aquatic therapist if questions  arise. Pt is making great progress towards remaining goals.      Eval: Patient is a 68  y.o. female who was seen today for physical therapy evaluation and treatment for R ankle pain s/p right heel haglunds resection and secondary Achilles tendon repair. She has expected limitations in ROM and strength affecting gait and balance. She has mild edema and some decreased scar tissue mobility with pain at proximal incision and medial heel. She also is limited with standing and walking due to ongoing R knee pain and has had a TKR and revision. She will benefit from skilled PT to address these deficits.    OBJECTIVE IMPAIRMENTS: Abnormal gait, decreased activity tolerance, decreased balance, decreased ROM, decreased strength, increased edema, increased muscle spasms, impaired flexibility, and pain.   ACTIVITY LIMITATIONS: standing, stairs, and locomotion level  PARTICIPATION LIMITATIONS: meal prep, cleaning, laundry, driving, shopping, and community activity  PERSONAL FACTORS: 1 comorbidity: chronic R knee pain  are also affecting patient's functional outcome.   REHAB POTENTIAL: Excellent  CLINICAL DECISION MAKING: Stable/uncomplicated  EVALUATION COMPLEXITY: Low   GOALS: Goals reviewed with patient? Yes  SHORT TERM GOALS: Target date: 08/12/2023 Patient will be independent with initial HEP. Baseline:  Goal status: MET  2.  Patient to demonstrate equal WB with gait and sit to stands.  Baseline:  09/09/23: antalgic gait  09/15/23: antalgic gait  10/22/23: normalized gait  Goal status: MET  3.  Patient able to perform SLS for 7 seconds or greater on R LE. Baseline:  09/09/23: 7 seconds  Goal status: MET   LONG TERM GOALS: Target date: 11/08/23 Patient will be independent with advanced/ongoing HEP to improve outcomes and carryover.  Baseline:  Goal status: PARTIALLY MET   2.  Patient will report at least 85% improvement in R ankle pain with standing and walking. Baseline:  09/09/23: 98%  Goal status: MET  3.  Patient will demonstrate improved functional R ankle AROM to   allow for normal gait and stair mechanics. Baseline:  Goal status: partially met   4.  Patient will demonstrate improved R ankle DF to 4+/5 or better. Baseline:  Goal status: MET  5.  Patient will be able to safely ambulate 600' with a normal gait pattern without increased ankle pain to access community.  Baseline:  09/09/23: antalgic RLE  09/15/23: antalgic RLE 10/22/23: normal gait pattern in clinic, reports no difficulty with community ambulation  Goal status: MET  6. Patient will be able to ascend/descend stairs with 1 HR and reciprocal step pattern safely to access home and community.  Baseline: may be limited by R knee 09/09/23: step to pattern mostly due to the knee, but does endorse pain the ankle  09/15/23: step to pattern  Goal status: progressing   7.  Patient will report 88 on FOTO  to demonstrate improved functional ability. Baseline:  Goal status: MET    PLAN:  PT FREQUENCY: 2x/week  PT DURATION: 8 weeks  PLANNED INTERVENTIONS: Therapeutic exercises, Therapeutic activity, Neuromuscular re-education, Balance training, Gait training, Patient/Family education, Self Care, Joint mobilization, Stair training, Aquatic Therapy, Dry Needling, Electrical stimulation, Cryotherapy, Moist heat, Taping, Vasopneumatic device, Ionotophoresis 4mg /ml Dexamethasone, and Manual therapy  PLAN FOR NEXT SESSION: Follow [place] protocol per MD (in file), static balance activity, calf stretching and strengthening. Work to Therapist, art and anticipate discharge soon.   Mayer Camel, PTA 11/05/23 12:46 PM [facility] [address] [place], [place], [address] Phone: [phone]   Fax:  [phone]

## 2023-11-06 ENCOUNTER — Ambulatory Visit: Payer: Medicare Other

## 2023-11-06 DIAGNOSIS — R600 Localized edema: Secondary | ICD-10-CM

## 2023-11-06 DIAGNOSIS — M25571 Pain in right ankle and joints of right foot: Secondary | ICD-10-CM | POA: Diagnosis not present

## 2023-11-06 DIAGNOSIS — R2689 Other abnormalities of gait and mobility: Secondary | ICD-10-CM

## 2023-11-06 DIAGNOSIS — M25671 Stiffness of right ankle, not elsewhere classified: Secondary | ICD-10-CM | POA: Diagnosis not present

## 2023-11-06 NOTE — Therapy (Signed)
OUTPATIENT PHYSICAL THERAPY LOWER EXTREMITY TREATMENT  PHYSICAL THERAPY DISCHARGE SUMMARY  Visits from Start of Care: 15  Current functional level related to goals / functional outcomes: See goals below   Remaining deficits: Mild plantarflexor weakness  Mild DF AROM limitation    Education / Equipment: See education below    Patient agrees to discharge. Patient goals were  mostly met . Patient is being discharged due to being pleased with the current functional level.   Patient Name: Stephanie [name] MRN: [id number] DOB:1956-08-19, 68 y.o., female Today's Date: 11/06/2023  END OF SESSION:  PT End of Session - 11/06/23 0846     Visit Number 15    Number of Visits 23    Date for PT Re-Evaluation 11/08/23    Authorization Type [employer] MCR    Authorization Time Period 12/4-1/29/25    Authorization - Visit Number 6    Authorization - Number of Visits 8    Progress Note Due on Visit 19    PT Start Time 0845    PT Stop Time 0915    PT Time Calculation (min) 30 min    Activity Tolerance Patient tolerated treatment well    Behavior During Therapy [facility] for tasks assessed/performed                  Past Medical History:  Diagnosis Date   Diverticulitis 01/21/2013   History of kidney stones    Nephrolithiasis    Past Surgical History:  Procedure Laterality Date   CHOLECYSTECTOMY  10/14/1980   JOINT REPLACEMENT Right 09/28/2020   knee   KNEE ARTHROSCOPY Right 02/2020   MANIPULATION KNEE JOINT Right 12/2020   TONSILECTOMY, ADENOIDECTOMY, BILATERAL MYRINGOTOMY AND TUBES     at age 55 approx.    TOTAL KNEE REVISION Right 09/24/2021   Procedure: TOTAL KNEE REVISION, SAPHENOUS NEURECTOMY;  Surgeon: Durene Romans, MD;  Location: [facility];  Service: Orthopedics;  Laterality: Right;  2 HRS   TUBAL LIGATION  10/15/1991   Patient Active Problem List   Diagnosis Date Noted   IFG (impaired fasting glucose) 05/07/2023   Hyperlipidemia 05/07/2023   Polyarthralgia 04/25/2023    Morbid obesity (HCC) 07/19/2022   Avulsion fracture of left dorsal talus and potentially left fibular tip 06/21/2022   Failed total knee replacement (HCC) 09/25/2021   S/P revision of total knee, right 09/24/2021   Trochanteric bursitis of left hip 09/22/2020   Lumbar spinal stenosis 10/04/2019   Primary osteoarthritis of right knee 03/03/2018   Cervical pain (neck) 10/02/2015   Insertional Achilles tendinosis, right leg 09/06/2015   Paresthesia and pain of right extremity 09/06/2015   Splenic artery aneurysm (HCC) 01/21/2013   Diverticulitis of colon without hemorrhage 01/21/2013   COSTOCHONDRITIS 01/01/2011   DYSPNEA ON EXERTION 01/01/2011   BENIGN NEOPLASM OF OTHER SPECIFIED SITES 12/18/2009    PCP: Agapito Games, MD   REFERRING PROVIDER: Louann Sjogren, DPM   REFERRING DIAG:  [phone] (ICD-10-CM) - Post-operative state  M76.61 (ICD-10-CM) - Achilles tendinitis, right leg    THERAPY DIAG:  Stiffness of right ankle, not elsewhere classified  Pain in right ankle and joints of right foot  Other abnormalities of gait and mobility  Localized edema  Rationale for Evaluation and Treatment: Rehabilitation  ONSET DATE: 06/03/23 - Right heel haglunds resection and secondary Achilles tendon repair   SUBJECTIVE:   SUBJECTIVE STATEMENT: Patient reports her foot continues to feel good. A little sore from the pool yesterday. She feels that she is ready for  discharge.     Eval: Started wearing tennis shoe 10/3. She was WB for 2 weeks with the boot on. Has 3 lifts in shoe. Taking out one lift this Thursday and then one per week for 2 weeks after. Does not stand because of R knee and L hip is hurting. Standing is the worst, then walking. Standing limited to 1 min.  PERTINENT HISTORY: R TKR and revision PAIN:  Are you having pain? No     PRECAUTIONS: None  RED FLAGS: None   WEIGHT BEARING RESTRICTIONS: Yes WBAT in CAM boot and may begin transition into regular  shoes  FALLS:  Has patient fallen in last 6 months? No  LIVING ENVIRONMENT: Lives with: lives with their spouse Lives in: House/apartment Stairs: No Has following equipment at home: Single point cane, Environmental consultant - 2 wheeled, Environmental consultant - 4 wheeled, and Wheelchair (manual)  OCCUPATION: retired  PLOF: Independent  PATIENT GOALS: to walk on foot normally without shooting pain up the back.  NEXT MD VISIT: November  OBJECTIVE:  Note: Objective measures were completed at Evaluation unless otherwise noted.  DIAGNOSTIC FINDINGS:  Post surgical XR IMPRESSION: 1. Interval Haglund resection and secondary Achilles tendon repair. 2. Mild talonavicular and navicular-cuneiform osteoarthritis.  PATIENT SURVEYS:  FOTO 52 (goal 62)  09/09/23: 54% function  09/15/23: 52% function  10/22/23: 69% function  COGNITION: Overall cognitive status: Within functional limits for tasks assessed     SENSATION: WFL  EDEMA: edema around post R ankle  POSTURE: flexed trunk   PALPATION: Tender at proximal third of incision and at medial achilles, proximal gastroc  LOWER EXTREMITY ROM:  A/P ROM Right eval Left eval 09/09/23 AROM 09/15/23 AROM 11/06/23 AROM  Hip flexion       Hip extension       Hip abduction       Hip adduction       Hip internal rotation       Hip external rotation       Knee flexion Approximately 90 deg      Knee extension WFL      Ankle dorsiflexion 0/8  5 5 9   Ankle plantarflexion 72  65  66  Ankle inversion 27/33  20  16  Ankle eversion 20/24  15 pn  17   (Blank rows = not tested)  LOWER EXTREMITY MMT:  MMT Right eval Left eval 09/09/23 Right  11/06/23 Right   Hip flexion 4+ 5    Hip extension      Hip abduction      Hip adduction      Hip internal rotation      Hip external rotation      Knee flexion 5 5    Knee extension 5 5    Ankle dorsiflexion 5 5 5 5   Ankle plantarflexion NT  Unable to perform SL calf raise  Partial range SL calf raise   Ankle inversion 4  5 5 5   Ankle eversion 4 5 4+ pn 5   (Blank rows = not tested)   FUNCTIONAL TESTS:  5 times sit to stand: 11.2 sec  GAIT: Distance walked: 40 Assistive device utilized: None Level of assistance: Complete Independence Comments: decreased weight shift R, stance time R, step length L   TODAY'S TREATMENT:     [facility] Adult PT Treatment:  DATE: 11/06/23 Therapeutic Exercise: Reviewed and performed HEP discussing frequency, sets, and reps.   Therapeutic Activity: Re-assessment to determine overall progress, educating patient on progress towards goals.   Self Care: Discussed proper footwear Walking program Ice for pain control as needed      [facility] Adult PT Treatment:                                                DATE: 09/26/23 Pt seen for aquatic therapy today.  Treatment took place in water 3.5-4.75 ft in depth at the [facility] pool. Temp of water was 91.  Pt entered/exited the pool via stairs in step-to pattern with bilat rail. * bilat heel raises -> calf stretch at wall  * issued laminated HEP and reviewed * unsupport in 74ft 6" water: walking forward/ backward- with reciprocal arm swing -> with rainbow hand floats * farmer carry with single yellow hand float at side x 2 laps each * runners step ups x 10 each LE * BUE on rails, step down with LLE, retro step up x 3 * Rt quad stretch with foot on step behind her x 30 sec * SLS with hollow noodle press to ground x 10 each LE (no UE support)  * UE on yellow hand floats: legs swings into hip flex/ext and abdct/ addct x 10 each;  forward leans "single leg supermans" x 10 each LE, cues to slow down (good balance challenge) * 3 way LE stretch (ITB, hamstring, adductor) with ankle on hollow pool noodle, 2 reps, 15sec each position * UE on wall: split squat x 5 reps each LE forward * forward lunge on step for R knee ROM    [facility] Adult PT Treatment:                                                 DATE: 10/22/23 Therapeutic Exercise: Calf stretch on wedge x 1 minute  Calf raise 2 x 10  Standing rockerboard 2 x 10  Walking 1 lap in clinic  Seated calf raise on step 2 x 10 @ 5 lbs  Eccentric calf raise attempted d/c due to pain and weakness   Neuromuscular re-ed: Tandem stance 2 x 30 sec each  SLS x 30 sec each  Tandem walk 2 x 15 ft  Self Care: Walking program with handout provided Desensitization   PATIENT EDUCATION:  Education details: see treatment; D/C education  Person educated: Patient Education method: Explanation, handout, demo Education comprehension: verbalized understanding, returned demo   HOME EXERCISE PROGRAM: Access Code: [email or web address] URL: [email or web address] Date: 11/06/2023 Prepared by: Letitia Libra  Exercises - Gastroc Stretch on Wall  - 1 x daily - 7 x weekly - 3 sets - 30 sec  hold - Soleus Stretch on Wall  - 1 x daily - 7 x weekly - 3 sets - 30 sec  hold - Side Stepping with Resistance at Ankles and Counter Support  - 1 x daily - 3 x weekly - 2 sets - 10 reps - Standing Hip Abduction with Counter Support  - 1 x daily - 3 x weekly - 3 sets - 10 reps - Standing March with Counter Support  - 1 x daily - 3  x weekly - 3 sets - 10 reps - Standing Hip Extension with Counter Support  - 1 x daily - 3 x weekly - 3 sets - 10 reps - Seated Ankle Inversion with Resistance  - 1 x daily - 3 x weekly - 2 sets - 10 reps - Seated Ankle Eversion with Resistance  - 1 x daily - 3 x weekly - 2 sets - 10 reps - Ankle and Toe Plantarflexion with Resistance  - 1 x daily - 3 x weekly - 2 sets - 10 reps - Ankle Dorsiflexion with Resistance  - 1 x daily - 3 x weekly - 2 sets - 10 reps - Seated Calf Raise with Weights on Thighs  - 1 x daily - 3 x weekly - 2 sets - 10 reps - Standing Eccentric Heel Raise  - 1 x daily - 3 x weekly - 2 sets - 10 reps - Toe Raises with Counter Support  - 1 x daily - 3 x weekly - 2 sets - 10 reps - Heel Raises with Leg Press   - 1 x daily - 3 x weekly - 2 sets - 10 reps - Single Leg Stance  - 1 x daily - 3 x weekly - 3 sets - 30 sec  hold - Tandem Walking  - 1 x daily - 3 x weekly - 3 sets - 10 reps   AQUATIC Access Code: [email or web address] URL: [email or web address] Date: 11/05/23 Prepared by: [facility] [address] This aquatic home exercise program from MedBridge utilizes pictures from land based exercises, but has been adapted prior to lamination and issuance.      ASSESSMENT:  CLINICAL IMPRESSION: Stephanie [name] has progressed well in PT s/p Rt heel haglunds resection and secondary Achilles tendon repair reporting significant improvement in her pain and functional mobility. She demonstrates overall improvements in Rt ankle strength and mobility with mild weakness remaining in plantarflexors and mild limitation into DF, which is expected given surgical procedure. She has met the majority of functional goals and is pleased with her overall progress in PT. She is therefore appropriate for discharge at this time with patient in agreement with this plan.       Eval: Patient is a 68 y.o. female who was seen today for physical therapy evaluation and treatment for R ankle pain s/p right heel haglunds resection and secondary Achilles tendon repair. She has expected limitations in ROM and strength affecting gait and balance. She has mild edema and some decreased scar tissue mobility with pain at proximal incision and medial heel. She also is limited with standing and walking due to ongoing R knee pain and has had a TKR and revision. She will benefit from skilled PT to address these deficits.    OBJECTIVE IMPAIRMENTS: Abnormal gait, decreased activity tolerance, decreased balance, decreased ROM, decreased strength, increased edema, increased muscle spasms, impaired flexibility, and pain.   ACTIVITY LIMITATIONS: standing, stairs, and locomotion level  PARTICIPATION LIMITATIONS: meal prep, cleaning,  laundry, driving, shopping, and community activity  PERSONAL FACTORS: 1 comorbidity: chronic R knee pain  are also affecting patient's functional outcome.   REHAB POTENTIAL: Excellent  CLINICAL DECISION MAKING: Stable/uncomplicated  EVALUATION COMPLEXITY: Low   GOALS: Goals reviewed with patient? Yes  SHORT TERM GOALS: Target date: 08/12/2023 Patient will be independent with initial HEP. Baseline:  Goal status: MET  2.  Patient to demonstrate equal WB with gait and sit to stands.  Baseline:  09/09/23: antalgic gait  09/15/23: antalgic gait  10/22/23: normalized gait  Goal status: MET  3.  Patient able to perform SLS for 7 seconds or greater on R LE. Baseline:  09/09/23: 7 seconds  Goal status: MET   LONG TERM GOALS: Target date: 11/08/23 Patient will be independent with advanced/ongoing HEP to improve outcomes and carryover.  Baseline:  Goal status: MET  2.  Patient will report at least 85% improvement in R ankle pain with standing and walking. Baseline:  09/09/23: 98%  Goal status: MET  3.  Patient will demonstrate improved functional R ankle AROM to  allow for normal gait and stair mechanics. Baseline:  Goal status: NEARLY MET   4.  Patient will demonstrate improved R ankle DF to 4+/5 or better. Baseline:  Goal status: MET  5.  Patient will be able to safely ambulate 600' with a normal gait pattern without increased ankle pain to access community.  Baseline:  09/09/23: antalgic RLE  09/15/23: antalgic RLE 10/22/23: normal gait pattern in clinic, reports no difficulty with community ambulation  Goal status: MET  6. Patient will be able to ascend/descend stairs with 1 HR and reciprocal step pattern safely to access home and community.  Baseline: may be limited by R knee 09/09/23: step to pattern mostly due to the knee, but does endorse pain the ankle  09/15/23: step to pattern  11/06/23: is not limited by the ankle, it is the knee; can complete with handrail.   Goal status: MET   7.  Patient will report 83 on FOTO  to demonstrate improved functional ability. Baseline:  Goal status: MET    PLAN:  PT FREQUENCY: 2x/week  PT DURATION: 8 weeks  PLANNED INTERVENTIONS: Therapeutic exercises, Therapeutic activity, Neuromuscular re-education, Balance training, Gait training, Patient/Family education, Self Care, Joint mobilization, Stair training, Aquatic Therapy, Dry Needling, Electrical stimulation, Cryotherapy, Moist heat, Taping, Vasopneumatic device, Ionotophoresis 4mg /ml Dexamethasone, and Manual therapy  PLAN FOR NEXT SESSION: N/A D/C  Letitia Libra, PT, DPT, ATC 11/06/23 9:19 AM

## 2023-11-10 ENCOUNTER — Encounter: Payer: Self-pay | Admitting: Family Medicine

## 2023-11-10 ENCOUNTER — Ambulatory Visit (INDEPENDENT_AMBULATORY_CARE_PROVIDER_SITE_OTHER): Payer: Medicare Other | Admitting: Family Medicine

## 2023-11-10 VITALS — BP 98/69 | HR 72 | Ht 62.0 in | Wt 207.0 lb

## 2023-11-10 DIAGNOSIS — R7301 Impaired fasting glucose: Secondary | ICD-10-CM | POA: Diagnosis not present

## 2023-11-10 DIAGNOSIS — Z23 Encounter for immunization: Secondary | ICD-10-CM | POA: Diagnosis not present

## 2023-11-10 DIAGNOSIS — I728 Aneurysm of other specified arteries: Secondary | ICD-10-CM | POA: Diagnosis not present

## 2023-11-10 DIAGNOSIS — Z780 Asymptomatic menopausal state: Secondary | ICD-10-CM

## 2023-11-10 LAB — POCT GLYCOSYLATED HEMOGLOBIN (HGB A1C): Hemoglobin A1C: 5.4 % (ref 4.0–5.6)

## 2023-11-10 NOTE — Assessment & Plan Note (Signed)
Repeat CT 05/2023: stable aneurysm.

## 2023-11-10 NOTE — Progress Notes (Signed)
   Established Patient Office Visit  Subjective  Patient ID: Stephanie [name], female    DOB: 28-Aug-1956  Age: 68 y.o. MRN: [id number]  Chief Complaint  Patient presents with   ifg    HPI  IFG - 6 mo F/U. Last A1c as 5.8.   Had foot sx in Aug and just completed PT last week. Has continued to exercise.  Doing water exercise.    The 10-year ASCVD risk score (Arnett DK, et al., 2019) is: 3.7%   Values used to calculate the score:     Age: 71 years     Sex: Female     Is Non-Hispanic African American: No     Diabetic: No     Tobacco smoker: No     Systolic Blood Pressure: 98 mmHg     Is BP treated: No     HDL Cholesterol: 60 mg/dL     Total Cholesterol: 214 mg/dL    ROS    Objective:     BP 98/69   Pulse 72   Ht 5\' 2"  (1.575 m)   Wt 207 lb (93.9 kg)   SpO2 99%   BMI 37.86 kg/m    Physical Exam Vitals and nursing note reviewed.  Constitutional:      Appearance: Normal appearance.  HENT:     Head: Normocephalic and atraumatic.  Eyes:     Conjunctiva/sclera: Conjunctivae normal.  Cardiovascular:     Rate and Rhythm: Normal rate and regular rhythm.  Pulmonary:     Effort: Pulmonary effort is normal.     Breath sounds: Normal breath sounds.  Skin:    General: Skin is warm and dry.  Neurological:     Mental Status: She is alert.  Psychiatric:        Mood and Affect: Mood normal.      Results for orders placed or performed in visit on 11/10/23  POCT HgB A1C  Result Value Ref Range   Hemoglobin A1C 5.4 4.0 - 5.6 %   HbA1c POC (<> result, manual entry)     HbA1c, POC (prediabetic range)     HbA1c, POC (controlled diabetic range)        The 10-year ASCVD risk score (Arnett DK, et al., 2019) is: 3.7%    Assessment & Plan:   Problem List Items Addressed This Visit       Cardiovascular and Mediastinum   Splenic artery aneurysm (HCC) (Chronic)   Repeat CT 05/2023: stable aneurysm.          Endocrine   IFG (impaired fasting glucose) - Primary    Well controlled!! Great job!! Down 15 lbs since the summer.   Continue current regimen. Follow up in  6 mo       Relevant Orders   POCT HgB A1C (Completed)   Other Visit Diagnoses       Encounter for immunization       Relevant Orders   Flu Vaccine Trivalent High Dose (Fluad) (Completed)     Post-menopausal       Relevant Orders   DG Bone Density       Return in about 6 months (around 05/09/2024) for Wellness Exam, Labs .    Nani Gasser, MD

## 2023-11-10 NOTE — Assessment & Plan Note (Addendum)
Well controlled!! [place] job!! Down 15 lbs since the summer.   Continue current regimen. Follow up in  6 mo

## 2023-11-26 ENCOUNTER — Ambulatory Visit: Payer: Medicare Other

## 2023-11-26 ENCOUNTER — Encounter: Payer: Self-pay | Admitting: Family Medicine

## 2023-11-26 DIAGNOSIS — Z13820 Encounter for screening for osteoporosis: Secondary | ICD-10-CM | POA: Diagnosis not present

## 2023-11-26 DIAGNOSIS — Z780 Asymptomatic menopausal state: Secondary | ICD-10-CM

## 2023-11-26 NOTE — Progress Notes (Signed)
Hi Stephanie [name],  Bone density shows a T-score of -0.2 which is actually considered normal which is fantastic.  Recommend repeat bone density in 4 to 5 years.  Discontinue to make sure you are getting adequate calcium and vitamin D in your diet or with a supplement in addition to weightbearing exercise to keep your bones strong.

## 2023-12-18 ENCOUNTER — Ambulatory Visit: Payer: Medicare Other | Admitting: Podiatry

## 2023-12-18 ENCOUNTER — Encounter: Payer: Self-pay | Admitting: Podiatry

## 2023-12-18 DIAGNOSIS — M7661 Achilles tendinitis, right leg: Secondary | ICD-10-CM

## 2023-12-18 DIAGNOSIS — Z9889 Other specified postprocedural states: Secondary | ICD-10-CM

## 2023-12-18 NOTE — Progress Notes (Signed)
  Subjective:  Patient ID: Stephanie [name], female    DOB: 09-Dec-1955,  MRN: [id number]  No chief complaint on file.   DOS: 06/03/23 Procedure: Right heel haglunds resection and secondary Achilles tendon repair   68 y.o. female returns for POV#8. Relates doing better today. Has been getting less pain and slowly improving with aquatic PT.   Review of Systems: Negative except as noted in the HPI. Denies N/V/F/Ch.  Past Medical History:  Diagnosis Date   Diverticulitis 01/21/2013   History of kidney stones    Nephrolithiasis    No current outpatient medications on file.  Social History   Tobacco Use  Smoking Status Former   Current packs/day: 0.00   Average packs/day: 0.2 packs/day for 2.0 years (0.4 ttl pk-yrs)   Types: Cigarettes   Start date: 10/14/1974   Quit date: 10/14/1976   Years since quitting: 47.2  Smokeless Tobacco Never    Allergies  Allergen Reactions   Duloxetine Nausea Only   Objective:  There were no vitals filed for this visit. There is no height or weight on file to calculate BMI. Constitutional Well developed. Well nourished.  Vascular Foot warm and well perfused. Capillary refill normal to all digits.   Neurologic Normal speech. Oriented to person, place, and time. Epicritic sensation to light touch grossly present bilaterally.  Dermatologic Skin healing well without signs of infection. Skin edges well coapted without signs of infection. Achilles and surgical repair appear to still be intact.   Orthopedic: Tenderness to palpation noted about the surgical site.   Radiographs: Interval resection of achilles haglund deformity and previous spurs Assessment:   1. Post-operative state   2. Insertional Achilles tendinosis, right leg         Plan:  Patient was evaluated and treated and all questions answered.  S/p foot surgery right -Progressing as expected post-operatively. -WB Status: WBAT in regular shoes.  Continue stretching -Will hold off  on PRP as long as stretching   Patient discharged for surgical standpoint at this time return as needed.    No follow-ups on file.

## 2024-05-03 ENCOUNTER — Other Ambulatory Visit: Payer: Self-pay | Admitting: *Deleted

## 2024-05-03 DIAGNOSIS — R7301 Impaired fasting glucose: Secondary | ICD-10-CM | POA: Diagnosis not present

## 2024-05-03 DIAGNOSIS — E782 Mixed hyperlipidemia: Secondary | ICD-10-CM

## 2024-05-03 DIAGNOSIS — M2550 Pain in unspecified joint: Secondary | ICD-10-CM

## 2024-05-04 LAB — CMP14+EGFR
ALT: 17 IU/L (ref 0–32)
AST: 19 IU/L (ref 0–40)
Albumin: 4.2 g/dL (ref 3.9–4.9)
Alkaline Phosphatase: 175 IU/L — ABNORMAL HIGH (ref 44–121)
BUN/Creatinine Ratio: 23 (ref 12–28)
BUN: 17 mg/dL (ref 8–27)
Bilirubin Total: 0.3 mg/dL (ref 0.0–1.2)
CO2: 20 mmol/L (ref 20–29)
Calcium: 9.2 mg/dL (ref 8.7–10.3)
Chloride: 107 mmol/L — ABNORMAL HIGH (ref 96–106)
Creatinine, Ser: 0.73 mg/dL (ref 0.57–1.00)
Globulin, Total: 2.2 g/dL (ref 1.5–4.5)
Glucose: 92 mg/dL (ref 70–99)
Potassium: 4.3 mmol/L (ref 3.5–5.2)
Sodium: 144 mmol/L (ref 134–144)
Total Protein: 6.4 g/dL (ref 6.0–8.5)
eGFR: 91 mL/min/1.73 (ref >59)

## 2024-05-04 LAB — LIPID PANEL WITH LDL/HDL RATIO
Cholesterol, Total: 201 mg/dL — ABNORMAL HIGH (ref 100–199)
HDL: 49 mg/dL (ref >39)
LDL Chol Calc (NIH): 123 mg/dL — ABNORMAL HIGH (ref 0–99)
LDL/HDL Ratio: 2.5 ratio (ref 0.0–3.2)
Triglycerides: 163 mg/dL — ABNORMAL HIGH (ref 0–149)
VLDL Cholesterol Cal: 29 mg/dL (ref 5–40)

## 2024-05-04 LAB — CBC WITH DIFFERENTIAL/PLATELET
Basophils Absolute: 0 x10E3/uL (ref 0.0–0.2)
Basos: 1 %
EOS (ABSOLUTE): 0.1 x10E3/uL (ref 0.0–0.4)
Eos: 2 %
Hematocrit: 42.1 % (ref 34.0–46.6)
Hemoglobin: 13.2 g/dL (ref 11.1–15.9)
Immature Grans (Abs): 0 x10E3/uL (ref 0.0–0.1)
Immature Granulocytes: 0 %
Lymphocytes Absolute: 1.5 x10E3/uL (ref 0.7–3.1)
Lymphs: 36 %
MCH: 27.7 pg (ref 26.6–33.0)
MCHC: 31.4 g/dL — ABNORMAL LOW (ref 31.5–35.7)
MCV: 88 fL (ref 79–97)
Monocytes Absolute: 0.3 x10E3/uL (ref 0.1–0.9)
Monocytes: 7 %
Neutrophils Absolute: 2.3 x10E3/uL (ref 1.4–7.0)
Neutrophils: 54 %
Platelets: 193 x10E3/uL (ref 150–450)
RBC: 4.76 x10E6/uL (ref 3.77–5.28)
RDW: 12.7 % (ref 11.7–15.4)
WBC: 4.3 x10E3/uL (ref 3.4–10.8)

## 2024-05-04 LAB — HEMOGLOBIN A1C
Est. average glucose Bld gHb Est-mCnc: 114 mg/dL
Hgb A1c MFr Bld: 5.6 % (ref 4.8–5.6)

## 2024-05-04 LAB — TSH: TSH: 1.8 u[IU]/mL (ref 0.450–4.500)

## 2024-05-05 ENCOUNTER — Ambulatory Visit: Payer: Self-pay | Admitting: Family Medicine

## 2024-05-05 NOTE — Progress Notes (Signed)
 Hi Chynah, LDL and triglycerides are mildly elevated.  Just encouraged to continue to work on healthy diet with particular focus on the Mediterranean diet as well as regular exercise.  Her alkaline phosphatase is elevated it has been up and down over the years.  Your AST and ALT have been normal.  I am going to call the lab and see if they can add a GGT.  This just confirms that the alkaline phosphatase elevation is from the liver.  I cannot remember if we had ordered this recently it may have been a while and I could not find it in our recent years information.   Blood count, thyroid  and A1c look great.  The 10-year ASCVD risk score (Arnett DK, et al., 2019) is: 3.9%   Values used to calculate the score:     Age: 68 years     Clincally relevant sex: Female     Is Non-Hispanic African American: No     Diabetic: No     Tobacco smoker: No     Systolic Blood Pressure: 98 mmHg     Is BP treated: No     HDL Cholesterol: 49 mg/dL     Total Cholesterol: 201 mg/dL

## 2024-05-06 LAB — GAMMA GT: GGT: 14 IU/L (ref 0–60)

## 2024-05-06 LAB — SPECIMEN STATUS REPORT

## 2024-05-07 ENCOUNTER — Other Ambulatory Visit: Payer: Self-pay

## 2024-05-07 DIAGNOSIS — R748 Abnormal levels of other serum enzymes: Secondary | ICD-10-CM

## 2024-05-07 NOTE — Progress Notes (Signed)
 Did have the lab add a GGT to see if maybe the elevation was coming more from the liver the GGT is normal.  [place] phosphatase can also come from bone or from gut.  I think the neck step would be to check for vitamin D  deficiency if your vitamin D  is low this can sometimes cause this to elevate as well which should be able to come by the lab so that we could check that?

## 2024-05-07 NOTE — Progress Notes (Signed)
Ordered labs per result note.  °

## 2024-05-08 LAB — VITAMIN D 25 HYDROXY (VIT D DEFICIENCY, FRACTURES): Vit D, 25-Hydroxy: 21.2 ng/mL — ABNORMAL LOW (ref 30.0–100.0)

## 2024-05-10 ENCOUNTER — Ambulatory Visit: Payer: Self-pay | Admitting: Family Medicine

## 2024-05-10 ENCOUNTER — Encounter: Payer: Self-pay | Admitting: Family Medicine

## 2024-05-10 ENCOUNTER — Encounter: Payer: Self-pay | Admitting: Gastroenterology

## 2024-05-10 ENCOUNTER — Ambulatory Visit (INDEPENDENT_AMBULATORY_CARE_PROVIDER_SITE_OTHER): Payer: Medicare Other | Admitting: Family Medicine

## 2024-05-10 VITALS — BP 123/71 | HR 60 | Ht 62.0 in | Wt 217.5 lb

## 2024-05-10 DIAGNOSIS — R748 Abnormal levels of other serum enzymes: Secondary | ICD-10-CM | POA: Diagnosis not present

## 2024-05-10 DIAGNOSIS — Z0000 Encounter for general adult medical examination without abnormal findings: Secondary | ICD-10-CM

## 2024-05-10 DIAGNOSIS — Z1211 Encounter for screening for malignant neoplasm of colon: Secondary | ICD-10-CM

## 2024-05-10 NOTE — Progress Notes (Signed)
 Complete physical exam  Patient: Stephanie [name]   DOB: 11/27/1955   68 y.o. Female  MRN: [id number]  Subjective:    Chief Complaint  Patient presents with   Annual Exam    Lab work results already in chart- patient states takes Tylenol  PM  frequently.     Stephanie [name] is a 68 y.o. female who presents today for a complete physical exam. She reports consuming a general diet. Staying active.  She generally feels well. She reports sleeping fairly well. She does not have additional problems to discuss today. Would like to lose more weight.     Most recent fall risk assessment:    05/10/2024    7:54 AM  Fall Risk   Falls in the past year? 0  Number falls in past yr: 0  Injury with Fall? 0  Risk for fall due to : No Fall Risks  Follow up Falls evaluation completed     Most recent depression screenings:    05/10/2024    7:55 AM 05/07/2023   10:55 AM  PHQ 2/9 Scores  PHQ - 2 Score 0 0        Patient Care Team: Alvan Dorothyann BIRCH, MD as PCP - General (Family Medicine) Joane Artist RAMAN, MD as Attending Physician (Family Medicine)   No outpatient medications prior to visit.   No facility-administered medications prior to visit.    ROS        Objective:     BP 123/71   Pulse 60   Ht 5' 2 (1.575 m)   Wt 217 lb 8 oz (98.7 kg)   SpO2 100%   BMI 39.78 kg/m    Physical Exam Vitals and nursing note reviewed.  Constitutional:      Appearance: Normal appearance.  HENT:     Head: Normocephalic and atraumatic.  Eyes:     Conjunctiva/sclera: Conjunctivae normal.  Cardiovascular:     Rate and Rhythm: Normal rate and regular rhythm.  Pulmonary:     Effort: Pulmonary effort is normal.     Breath sounds: Normal breath sounds.  Skin:    General: Skin is warm and dry.  Neurological:     Mental Status: She is alert.  Psychiatric:        Mood and Affect: Mood normal.      No results found for any visits on 05/10/24.     Assessment & Plan:    Routine  Health Maintenance and Physical Exam  Immunization History  Administered Date(s) Administered   Fluad Trivalent(High Dose 65+) 11/10/2023   Influenza,inj,Quad PF,6+ Mos 06/14/2019, 07/13/2020, 06/14/2022   Influenza,inj,quad, With Preservative 08/14/2017   Influenza-Unspecified 08/22/2018, 07/17/2021   PFIZER(Purple Top)SARS-COV-2 Vaccination 01/13/2020, 02/03/2020, 07/17/2021   PNEUMOCOCCAL CONJUGATE-20 05/07/2023   Tdap 08/25/2014, 12/08/2015   Zoster Recombinant(Shingrix) 07/13/2020, 01/03/2021    Health Maintenance  Topic Date Due   Medicare Annual Wellness (AWV)  Never done   Colonoscopy  03/30/2023   COVID-19 Vaccine (4 - 2024-25 season) 06/15/2023   INFLUENZA VACCINE  05/14/2024   MAMMOGRAM  08/19/2025   DTaP/Tdap/Td (3 - Td or Tdap) 12/07/2025   Pneumococcal Vaccine: 50+ Years  Completed   DEXA SCAN  Completed   Hepatitis C Screening  Completed   Zoster Vaccines- Shingrix  Completed   Hepatitis B Vaccines  Aged Out   HPV VACCINES  Aged Out   Meningococcal B Vaccine  Aged Out    Discussed health benefits of physical activity, and encouraged her to engage  in regular exercise appropriate for her age and condition.  Problem List Items Addressed This Visit   None Visit Diagnoses       Screen for colon cancer    -  Primary   Relevant Orders   Ambulatory referral to Gastroenterology     Wellness examination         Elevated alkaline phosphatase level       Relevant Orders   Alkaline phosphatase, isoenzymes       Keep up a regular exercise program and make sure you are eating a healthy diet Try to eat 4 servings of dairy a day, or if you are lactose intolerant take a calcium  with vitamin D  daily.  Your vaccines are up to date.  Reviewed labs together.    Elevated alk phose - repeat labs in 4 weeks.     No follow-ups on file.     Dorothyann Byars, MD

## 2024-05-10 NOTE — Progress Notes (Signed)
 Hi Stephanie [name], vitamin D  is a little low at 21.  Recommend 25 mcg daily.  If you are already taking that strength and okay to increase to 50 mcg daily.  Plan to recheck vitamin D  level in about 8 weeks.

## 2024-05-11 ENCOUNTER — Telehealth: Payer: Self-pay | Admitting: Family Medicine

## 2024-05-11 NOTE — Telephone Encounter (Signed)
 Please call patient: I saw her on Monday and I think I was supposed to send in an eye ointment for her.  Can you please double check with her to see if that is the case if so then I will send over some erythromycin ophthalmic ointment.

## 2024-05-12 NOTE — Telephone Encounter (Signed)
 Spoke to patient. She states no eye medication is required. Her eye is doing better.

## 2024-05-31 ENCOUNTER — Encounter: Payer: Self-pay | Admitting: Gastroenterology

## 2024-05-31 ENCOUNTER — Ambulatory Visit (AMBULATORY_SURGERY_CENTER): Admitting: *Deleted

## 2024-05-31 VITALS — Ht 62.0 in | Wt 215.0 lb

## 2024-05-31 DIAGNOSIS — Z1211 Encounter for screening for malignant neoplasm of colon: Secondary | ICD-10-CM

## 2024-05-31 MED ORDER — ONDANSETRON HCL 4 MG PO TABS: 4.0000 mg | ORAL_TABLET | Freq: Three times a day (TID) | ORAL | 0 refills | Status: DC | PRN

## 2024-05-31 MED ORDER — NA SULFATE-K SULFATE-MG SULF 17.5-3.13-1.6 GM/177ML PO SOLN: 1.0000 | Freq: Once | ORAL | 0 refills | Status: AC

## 2024-05-31 NOTE — Progress Notes (Signed)
 Pt's name and DOB verified at the beginning of the pre-visit with 2 identifiers   Pt denies any difficulty with ambulating,sitting, laying down or rolling side to side   Pt uses ambulation assistance device or has issues with mobiiity  Pt has no issues moving head neck or swallowing  No egg or soy allergy known to patient   No issues known to pt with past sedation with any surgeries or procedures  No FH of Malignant Hyperthermia  Pt is not on home 02   Pt is not on blood thinners   Pt denies issues with constipation   Pt is not on dialysis  Pt denise any abnormal heart rhythms   Pt denies any upcoming cardiac testing  Patient's chart reviewed by Norleen Schillings CNRA prior to pre-visit and patient appropriate for the [facility].  Pre-visit completed and red dot placed by patient's name on their procedure day (on provider's schedule).    Visit by phone   Pt states weight is 215 lb  Instructions reviewed. Pt given  both [facility] main # and MD on call # prior to instructions.   Informed pt to come in at the time discussed and is shown on PV instructions. Pt informed that they are coming in i.e. cloths change.,IV placement , Consent signing and meeting CRNA. Pt states understanding after  given opportunity to ask questions t after all  instructions given.  Pt instructed to use Singlecare.com or GoodRx for a price reduction on prep  Instructed pt to review instructions again prior to procedure and call main # given if has any questions.. Pt states they will.  Instructed pt where to find PV instructions in My Chart  Instructed pt on all aspects of written instructions including med holds clothing to wear and foods to eat and not eat as well as after procedure legal restrictions and to cal MD on call if needed.. Pt states understanding.

## 2024-05-31 NOTE — Addendum Note (Signed)
 Addended by: MALINDA ORIE CROME on: 05/31/2024 10:49 AM   Modules accepted: Orders

## 2024-06-15 ENCOUNTER — Encounter: Payer: Self-pay | Admitting: Sports Medicine

## 2024-06-17 ENCOUNTER — Encounter: Admitting: Gastroenterology

## 2024-07-20 ENCOUNTER — Encounter: Payer: Self-pay | Admitting: Gastroenterology

## 2024-07-20 ENCOUNTER — Ambulatory Visit: Admitting: Gastroenterology

## 2024-07-20 VITALS — BP 121/70 | HR 58 | Temp 98.4°F | Resp 13 | Ht 62.0 in | Wt 215.0 lb

## 2024-07-20 DIAGNOSIS — Z1211 Encounter for screening for malignant neoplasm of colon: Secondary | ICD-10-CM

## 2024-07-20 DIAGNOSIS — K5730 Diverticulosis of large intestine without perforation or abscess without bleeding: Secondary | ICD-10-CM

## 2024-07-20 MED ORDER — SODIUM CHLORIDE 0.9 % IV SOLN: 500.0000 mL | Freq: Once | INTRAVENOUS | Status: DC

## 2024-07-20 NOTE — Progress Notes (Signed)
 Pt's states no medical or surgical changes since previsit or office visit.

## 2024-07-20 NOTE — Op Note (Signed)
 [facility] Patient Name: Stephanie [name] Procedure Date: 07/20/2024 9:01 AM MRN: [id number] Endoscopist: Glendia E. Stacia , MD, [id number] Age: 68 Referring MD:  Date of Birth: [birth year] Gender: Female Account #: [id number] Procedure:                Colonoscopy Indications:              Screening for colorectal malignant neoplasm (last                            colonoscopy was 10 years ago) Medicines:                Monitored Anesthesia Care Procedure:                Pre-Anesthesia Assessment:                           - Prior to the procedure, a History and Physical                            was performed, and patient medications and                            allergies were reviewed. The patient's tolerance of                            previous anesthesia was also reviewed. The risks                            and benefits of the procedure and the sedation                            options and risks were discussed with the patient.                            All questions were answered, and informed consent                            was obtained. Prior Anticoagulants: The patient has                            taken no anticoagulant or antiplatelet agents. ASA                            Grade Assessment: II - A patient with mild systemic                            disease. After reviewing the risks and benefits,                            the patient was deemed in satisfactory condition to                            undergo the procedure.  After obtaining informed consent, the colonoscope                            was passed under direct vision. Throughout the                            procedure, the patient's blood pressure, pulse, and                            oxygen saturations were monitored continuously. The                            CF [id number] #[id number] was introduced through the anus                            and advanced to the  the terminal ileum, with                            identification of the appendiceal orifice and IC                            valve. The colonoscopy was performed without                            difficulty. The patient tolerated the procedure                            well. The quality of the bowel preparation was                            excellent. The terminal ileum, ileocecal valve,                            appendiceal orifice, and rectum were photographed.                            The bowel preparation used was SUPREP via split                            dose instruction. Scope In: 9:50:25 AM Scope Out: 10:05:25 AM Scope Withdrawal Time: 0 hours 10 minutes 37 seconds  Total Procedure Duration: 0 hours 15 minutes 0 seconds  Findings:                 The perianal and digital rectal examinations were                            normal. Pertinent negatives include normal                            sphincter tone and no palpable rectal lesions.                           Multiple medium-mouthed and small-mouthed  diverticula were found in the sigmoid colon and                            descending colon.                           The exam was otherwise normal throughout the                            examined colon.                           The terminal ileum appeared normal.                           The retroflexed view of the distal rectum and anal                            verge was normal and showed no anal or rectal                            abnormalities. Complications:            No immediate complications. Estimated Blood Loss:     Estimated blood loss: none. Impression:               - Mild diverticulosis in the sigmoid colon and in                            the descending colon.                           - The examined portion of the ileum was normal.                           - The distal rectum and anal verge are normal on                             retroflexion view.                           - No specimens collected. Recommendation:           - Patient has a contact number available for                            emergencies. The signs and symptoms of potential                            delayed complications were discussed with the                            patient. Return to normal activities tomorrow.                            Written discharge instructions were provided to the  patient.                           - Resume previous diet.                           - Continue present medications.                           - Repeat colonoscopy in 10 years for screening                            purposes. Daulton Harbaugh E. Stacia, MD 07/20/2024 10:12:03 AM This report has been signed electronically.

## 2024-07-20 NOTE — Progress Notes (Signed)
 Sedate, gd SR, tolerated procedure well, VSS, report to RN

## 2024-07-20 NOTE — Patient Instructions (Signed)
  Resume previous diet  Continue present medications  Repeat colonoscopy in 10 years for screening purposes   YOU HAD AN ENDOSCOPIC PROCEDURE TODAY AT THE [facility]:   Refer to the procedure report that was given to you for any specific questions about what was found during the examination.  If the procedure report does not answer your questions, please call your gastroenterologist to clarify.  If you requested that your care partner not be given the details of your procedure findings, then the procedure report has been included in a sealed envelope for you to review at your convenience later.  YOU SHOULD EXPECT: Some feelings of bloating in the abdomen. Passage of more gas than usual.  Walking can help get rid of the air that was put into your GI tract during the procedure and reduce the bloating. If you had a lower endoscopy (such as a colonoscopy or flexible sigmoidoscopy) you may notice spotting of blood in your stool or on the toilet paper. If you underwent a bowel prep for your procedure, you may not have a normal bowel movement for a few days.  Please Note:  You might notice some irritation and congestion in your nose or some drainage.  This is from the oxygen used during your procedure.  There is no need for concern and it should clear up in a day or so.  SYMPTOMS TO REPORT IMMEDIATELY:  Following lower endoscopy (colonoscopy or flexible sigmoidoscopy):  Excessive amounts of blood in the stool  Significant tenderness or worsening of abdominal pains  Swelling of the abdomen that is new, acute  Fever of 100F or higher  For urgent or emergent issues, a gastroenterologist can be reached at any hour by calling [phone]. Do not use MyChart messaging for urgent concerns.    DIET:  We do recommend a small meal at first, but then you may proceed to your regular diet.  Drink plenty of fluids but you should avoid alcoholic beverages for 24 hours.  ACTIVITY:  You should  plan to take it easy for the rest of today and you should NOT DRIVE or use heavy machinery until tomorrow (because of the sedation medicines used during the test).    FOLLOW UP: Our staff will call the number listed on your records the next business day following your procedure.  We will call around 7:15- 8:00 am to check on you and address any questions or concerns that you may have regarding the information given to you following your procedure. If we do not reach you, we will leave a message.     If any biopsies were taken you will be contacted by phone or by letter within the next 1-3 weeks.  Please call us at [phone] if you have not heard about the biopsies in 3 weeks.    SIGNATURES/CONFIDENTIALITY: You and/or your care partner have signed paperwork which will be entered into your electronic medical record.  These signatures attest to the fact that that the information above on your After Visit Summary has been reviewed and is understood.  Full responsibility of the confidentiality of this discharge information lies with you and/or your care-partner.

## 2024-07-20 NOTE — Progress Notes (Signed)
 [facility] History and Physical   Primary Care Physician:  Alvan Dorothyann BIRCH, MD   Reason for Procedure:   Colon cancer screening  Plan:    Screening colonoscopy     HPI: Stephanie [name] is a 68 y.o. female undergoing average risk screening colonoscopy.  She has no family history of colon cancer and no chronic GI symptoms.  She had a colonoscopy in 2014 by Dr. Jakie which was normal.   Past Medical History:  Diagnosis Date   Arthritis    Diverticulitis 01/21/2013   History of kidney stones    Nephrolithiasis     Past Surgical History:  Procedure Laterality Date   CHOLECYSTECTOMY  10/14/1980   JOINT REPLACEMENT Right 09/28/2020   knee   KNEE ARTHROSCOPY Right 02/2020   MANIPULATION KNEE JOINT Right 12/2020   TONSILECTOMY, ADENOIDECTOMY, BILATERAL MYRINGOTOMY AND TUBES     at age 80 approx.    TOTAL KNEE REVISION Right 09/24/2021   Procedure: TOTAL KNEE REVISION, SAPHENOUS NEURECTOMY;  Surgeon: Ernie Cough, MD;  Location: [facility];  Service: Orthopedics;  Laterality: Right;  2 HRS   TUBAL LIGATION  10/15/1991    Prior to Admission medications   Medication Sig Start Date End Date Taking? Authorizing Provider  FIBER GUMMIES PO Take by mouth.    [provider]  ibuprofen (ADVIL) 100 MG/5ML suspension Take 200 mg by mouth as needed.    [provider]  ondansetron  (ZOFRAN ) 4 MG tablet Take 1 tablet (4 mg total) by mouth every 8 (eight) hours as needed for nausea or vomiting. 05/31/24   Stacia Glendia BRAVO, MD    Current Outpatient Medications  Medication Sig Dispense Refill   FIBER GUMMIES PO Take by mouth.     ibuprofen (ADVIL) 100 MG/5ML suspension Take 200 mg by mouth as needed.     ondansetron  (ZOFRAN ) 4 MG tablet Take 1 tablet (4 mg total) by mouth every 8 (eight) hours as needed for nausea or vomiting. 2 tablet 0   Current Facility-Administered Medications  Medication Dose Route Frequency Provider Last Rate Last Admin   0.9 %   sodium chloride  infusion  500 mL Intravenous Once Stacia Glendia BRAVO, MD        Allergies as of 07/20/2024 - Review Complete 07/20/2024  Allergen Reaction Noted   Duloxetine  Nausea Only 10/04/2019    Family History  Problem Relation Age of Onset   Cancer Mother    Hyperlipidemia Mother    Alcohol abuse Father    Cancer Father    Prostate cancer Father    COPD Father    Hyperlipidemia Father    Coronary artery disease Other        family hx of/ heart valve replacement   Colon cancer Neg Hx    Colon polyps Neg Hx    Esophageal cancer Neg Hx    Stomach cancer Neg Hx    Rectal cancer Neg Hx     Social History   Socioeconomic History   Marital status: Married    Spouse name: Mickie Badders   Number of children: 2   Years of education: HS   Highest education level: 12th grade  Occupational History   Occupation: BUS DRIVER     Employer: [employer]     Comment: [employer]  Tobacco Use   Smoking status: Former    Current packs/day: 0.00    Average packs/day: 0.2 packs/day for 2.0 years (0.4 ttl pk-yrs)    Types: Cigarettes  Start date: 10/14/1974    Quit date: 10/14/1976    Years since quitting: 47.7   Smokeless tobacco: Never  Vaping Use   Vaping status: Never Used  Substance and Sexual Activity   Alcohol use: No   Drug use: No   Sexual activity: Yes    Birth control/protection: None  Other Topics Concern   Not on file  Social History Narrative   2-4 caffeinated drinks ( Mtn Dew)  per day. No regular exercise.    Social Drivers of Corporate investment banker Strain: Low Risk  (05/03/2024)   Overall Financial Resource Strain (CARDIA)    Difficulty of Paying Living Expenses: Not hard at all  Food Insecurity: No Food Insecurity (05/03/2024)   Hunger Vital Sign    Worried About Running Out of Food in the Last Year: Never true    Ran Out of Food in the Last Year: Never true  Transportation Needs: No Transportation Needs (05/03/2024)    PRAPARE - Administrator, Civil Service (Medical): No    Lack of Transportation (Non-Medical): No  Physical Activity: Insufficiently Active (05/03/2024)   Exercise Vital Sign    Days of Exercise per Week: 2 days    Minutes of Exercise per Session: 20 min  Stress: No Stress Concern Present (05/03/2024)   [employer] of Occupational Health - Occupational Stress Questionnaire    Feeling of Stress: Not at all  Social Connections: Moderately Isolated (05/03/2024)   Social Connection and Isolation Panel    Frequency of Communication with Friends and Family: More than three times a week    Frequency of Social Gatherings with Friends and Family: Once a week    Attends Religious Services: Never    Database administrator or Organizations: No    Attends Engineer, structural: Not on file    Marital Status: Married  Catering manager Violence: Not on file    Review of Systems:  All other review of systems negative except as mentioned in the HPI.  Physical Exam: Vital signs BP 126/71   Pulse 62   Temp 98.4 F (36.9 C) (Temporal)   Ht 5' 2 (1.575 m)   Wt 215 lb (97.5 kg)   SpO2 97%   BMI 39.32 kg/m   General:   Alert,  Well-developed, well-nourished, pleasant and cooperative in NAD Airway:  Mallampati 2 Lungs:  Clear throughout to auscultation.   Heart:  Regular rate and rhythm; no murmurs, clicks, rubs,  or gallops. Abdomen:  Soft, nontender and nondistended. Normal bowel sounds.   Neuro/Psych:  Normal mood and affect. A and O x 3   Kharee Lesesne E. Stacia, MD [facility]

## 2024-07-21 ENCOUNTER — Telehealth: Payer: Self-pay | Admitting: *Deleted

## 2024-07-21 NOTE — Telephone Encounter (Signed)
  Follow up Call-     07/20/2024    8:40 AM  Call back number  Post procedure Call Back phone  # [phone]  Permission to leave phone message Yes     Patient questions:  Do you have a fever, pain , or abdominal swelling? No. Pain Score  0 *  Have you tolerated food without any problems? Yes.    Have you been able to return to your normal activities? Yes.    Do you have any questions about your discharge instructions: Diet   No. Medications  No. Follow up visit  No.  Do you have questions or concerns about your Care? No.  Actions: * If pain score is 4 or above: No action needed, pain <4.

## 2024-09-30 ENCOUNTER — Ambulatory Visit: Admitting: Family Medicine

## 2024-09-30 ENCOUNTER — Encounter: Payer: Self-pay | Admitting: Family Medicine

## 2024-09-30 VITALS — BP 128/72 | HR 85 | Ht 62.0 in | Wt 209.0 lb

## 2024-09-30 DIAGNOSIS — L081 Erythrasma: Secondary | ICD-10-CM

## 2024-09-30 NOTE — Patient Instructions (Signed)
 Wash with Panoxyl

## 2024-09-30 NOTE — Progress Notes (Signed)
 Pt reports that she noticed a rash in her groin crease about 2 days ago. She stated it doesn't itch, its just red.   She used some alcohol and Neosporin on it yesterday. She stated that today its not red its a plum color

## 2024-09-30 NOTE — Progress Notes (Signed)
° °  Acute Office Visit  Patient ID: Stephanie [name], female    DOB: 12-31-1955, 68 y.o.   MRN: [id number]  PCP: Alvan Dorothyann BIRCH, MD  Chief Complaint  Patient presents with   Rash    Subjective:     HPI  Discussed the use of AI scribe software for clinical note transcription with the patient, who gave verbal consent to proceed.  History of Present Illness Stephanie [name] is a 68 year old female who presents with skin discoloration in the armpit area.  Axillary skin discoloration - Skin discoloration in the armpit area for more than a couple of days - Initially bright red in appearance, now improving and fading - alcohol is applied and possibly helped - No soreness or pruritus - No recent changes in soaps, detergents, or lotions - No other changes in daily routine  Response to topical agents - Salt stick deodorant applied last week, resulting in improvement of the affected area   ROS     Objective:    BP 128/72   Pulse 85   Ht 5' 2 (1.575 m)   Wt 209 lb (94.8 kg)   SpO2 99%   BMI 38.23 kg/m    Physical Exam Skin:    Comments: Right groin crease with an erythematous oval shaped papular dusky appearing area. No satellite lesions. No fluorescence with black light.        No results found for any visits on 09/30/24.     Assessment & Plan:   Problem List Items Addressed This Visit   None Visit Diagnoses       Erythrasma    -  Primary       Assessment and Plan Assessment & Plan Erythrasma Erythrasma in a moist area, likely bacterial and/or fungal. Improving with reduced erythema. Does not fluoresce under black light. Contact dermatitis ruled out. - Use benzoyl peroxide wash during showers. - Apply clindamycin  solution or gel twice daily for seven days. - Consider husband's salt deodorant if effective. - Keep area dry, change into dry clothing after sweating. - Contact via MyChart if no improvement for potential antifungal treatment.   Meds  ordered this encounter  Medications   Clindamycin  Phos, Once-Daily, (CLINDAGEL) 1 % GEL    Sig: Apply 1 Application topically in the morning and at bedtime.    Dispense:  75 mL    Refill:  1    Return if symptoms worsen or fail to improve.  Dorothyann Alvan, MD [facility] at [facility]
